# Patient Record
Sex: Female | Born: 1940 | Race: White | Hispanic: No | Marital: Married | Smoking: Never smoker
Health system: Southern US, Community
[De-identification: ages and names within clinical notes are randomized; demographics above are authoritative.]

## PROBLEM LIST (undated history)

## (undated) DIAGNOSIS — I10 Essential (primary) hypertension: Secondary | ICD-10-CM

## (undated) DIAGNOSIS — Q2112 Patent foramen ovale: Secondary | ICD-10-CM

## (undated) DIAGNOSIS — I340 Nonrheumatic mitral (valve) insufficiency: Secondary | ICD-10-CM

## (undated) DIAGNOSIS — Q211 Atrial septal defect: Secondary | ICD-10-CM

## (undated) DIAGNOSIS — I214 Non-ST elevation (NSTEMI) myocardial infarction: Secondary | ICD-10-CM

## (undated) DIAGNOSIS — E785 Hyperlipidemia, unspecified: Secondary | ICD-10-CM

## (undated) DIAGNOSIS — R569 Unspecified convulsions: Secondary | ICD-10-CM

## (undated) DIAGNOSIS — I2510 Atherosclerotic heart disease of native coronary artery without angina pectoris: Secondary | ICD-10-CM

## (undated) DIAGNOSIS — I255 Ischemic cardiomyopathy: Secondary | ICD-10-CM

## (undated) DIAGNOSIS — T8144XA Sepsis following a procedure, initial encounter: Secondary | ICD-10-CM

## (undated) DIAGNOSIS — Z8673 Personal history of transient ischemic attack (TIA), and cerebral infarction without residual deficits: Secondary | ICD-10-CM

## (undated) HISTORY — DX: Unspecified convulsions: R56.9

## (undated) HISTORY — DX: Nonrheumatic mitral (valve) insufficiency: I34.0

## (undated) HISTORY — DX: Hyperlipidemia, unspecified: E78.5

## (undated) HISTORY — DX: Sepsis following a procedure, initial encounter: T81.44XA

## (undated) HISTORY — DX: Non-ST elevation (NSTEMI) myocardial infarction: I21.4

## (undated) HISTORY — PX: OTHER SURGICAL HISTORY: SHX169

## (undated) HISTORY — DX: Atrial septal defect: Q21.1

## (undated) HISTORY — DX: Patent foramen ovale: Q21.12

## (undated) HISTORY — DX: Ischemic cardiomyopathy: I25.5

## (undated) HISTORY — DX: Essential (primary) hypertension: I10

## (undated) HISTORY — DX: Atherosclerotic heart disease of native coronary artery without angina pectoris: I25.10

## (undated) HISTORY — DX: Personal history of transient ischemic attack (TIA), and cerebral infarction without residual deficits: Z86.73

## (undated) HISTORY — PX: MITRAL VALVE REPAIR: SHX2039

---

## 2006-06-29 ENCOUNTER — Emergency Department (HOSPITAL_COMMUNITY): Admission: EM | Admit: 2006-06-29 | Discharge: 2006-06-29 | Payer: Self-pay | Admitting: Emergency Medicine

## 2007-09-18 DIAGNOSIS — I214 Non-ST elevation (NSTEMI) myocardial infarction: Secondary | ICD-10-CM

## 2007-09-18 HISTORY — DX: Non-ST elevation (NSTEMI) myocardial infarction: I21.4

## 2007-09-28 ENCOUNTER — Encounter: Admission: RE | Admit: 2007-09-28 | Discharge: 2007-09-28 | Payer: Self-pay | Admitting: Obstetrics and Gynecology

## 2007-10-03 ENCOUNTER — Ambulatory Visit: Admission: RE | Admit: 2007-10-03 | Discharge: 2007-10-03 | Payer: Self-pay | Admitting: Gynecology

## 2007-10-15 ENCOUNTER — Inpatient Hospital Stay (HOSPITAL_COMMUNITY): Admission: RE | Admit: 2007-10-15 | Discharge: 2007-11-13 | Payer: Self-pay | Admitting: Obstetrics and Gynecology

## 2007-10-15 ENCOUNTER — Encounter (INDEPENDENT_AMBULATORY_CARE_PROVIDER_SITE_OTHER): Payer: Self-pay | Admitting: Obstetrics and Gynecology

## 2007-10-15 ENCOUNTER — Ambulatory Visit: Payer: Self-pay | Admitting: Pulmonary Disease

## 2007-10-17 ENCOUNTER — Ambulatory Visit: Payer: Self-pay | Admitting: Pulmonary Disease

## 2007-10-17 ENCOUNTER — Ambulatory Visit: Payer: Self-pay | Admitting: Cardiology

## 2007-10-17 ENCOUNTER — Encounter (INDEPENDENT_AMBULATORY_CARE_PROVIDER_SITE_OTHER): Payer: Self-pay | Admitting: Cardiology

## 2007-10-18 ENCOUNTER — Ambulatory Visit: Payer: Self-pay | Admitting: Surgery

## 2007-10-18 ENCOUNTER — Encounter: Payer: Self-pay | Admitting: Pulmonary Disease

## 2007-10-21 ENCOUNTER — Encounter (INDEPENDENT_AMBULATORY_CARE_PROVIDER_SITE_OTHER): Payer: Self-pay | Admitting: Surgery

## 2007-11-07 ENCOUNTER — Ambulatory Visit: Payer: Self-pay | Admitting: Infectious Diseases

## 2007-11-09 ENCOUNTER — Encounter (INDEPENDENT_AMBULATORY_CARE_PROVIDER_SITE_OTHER): Payer: Self-pay | Admitting: Neurology

## 2007-11-11 ENCOUNTER — Encounter (INDEPENDENT_AMBULATORY_CARE_PROVIDER_SITE_OTHER): Payer: Self-pay | Admitting: Neurology

## 2007-11-14 ENCOUNTER — Inpatient Hospital Stay: Admission: RE | Admit: 2007-11-14 | Discharge: 2007-12-17 | Payer: Self-pay | Admitting: Internal Medicine

## 2007-11-14 ENCOUNTER — Ambulatory Visit: Payer: Self-pay | Admitting: Critical Care Medicine

## 2007-12-10 ENCOUNTER — Telehealth (INDEPENDENT_AMBULATORY_CARE_PROVIDER_SITE_OTHER): Payer: Self-pay | Admitting: *Deleted

## 2008-01-18 HISTORY — PX: CORONARY ARTERY BYPASS GRAFT: SHX141

## 2008-04-07 ENCOUNTER — Ambulatory Visit: Payer: Self-pay | Admitting: Vascular Surgery

## 2008-04-08 ENCOUNTER — Ambulatory Visit (HOSPITAL_COMMUNITY): Admission: RE | Admit: 2008-04-08 | Discharge: 2008-04-08 | Payer: Self-pay | Admitting: *Deleted

## 2008-04-08 ENCOUNTER — Encounter: Payer: Self-pay | Admitting: Surgery

## 2008-04-08 HISTORY — PX: CARDIAC CATHETERIZATION: SHX172

## 2008-04-14 ENCOUNTER — Ambulatory Visit: Payer: Self-pay | Admitting: Thoracic Surgery (Cardiothoracic Vascular Surgery)

## 2008-04-22 ENCOUNTER — Ambulatory Visit: Payer: Self-pay | Admitting: Thoracic Surgery (Cardiothoracic Vascular Surgery)

## 2008-04-22 ENCOUNTER — Inpatient Hospital Stay (HOSPITAL_COMMUNITY)
Admission: RE | Admit: 2008-04-22 | Discharge: 2008-04-27 | Payer: Self-pay | Admitting: Thoracic Surgery (Cardiothoracic Vascular Surgery)

## 2008-05-12 ENCOUNTER — Ambulatory Visit: Payer: Self-pay | Admitting: Thoracic Surgery (Cardiothoracic Vascular Surgery)

## 2008-05-12 ENCOUNTER — Encounter
Admission: RE | Admit: 2008-05-12 | Discharge: 2008-05-12 | Payer: Self-pay | Admitting: Thoracic Surgery (Cardiothoracic Vascular Surgery)

## 2008-09-15 ENCOUNTER — Inpatient Hospital Stay (HOSPITAL_COMMUNITY): Admission: RE | Admit: 2008-09-15 | Discharge: 2008-09-18 | Payer: Self-pay | Admitting: Surgery

## 2009-04-01 IMAGING — CR DG CHEST 1V PORT
1 series · 1 of 1 positions shown · non-contrast
Comparison: 11/11/2007

CLINICAL DATA: Myocardial infarction

PORTABLE CHEST - 1 VIEW

[AP]
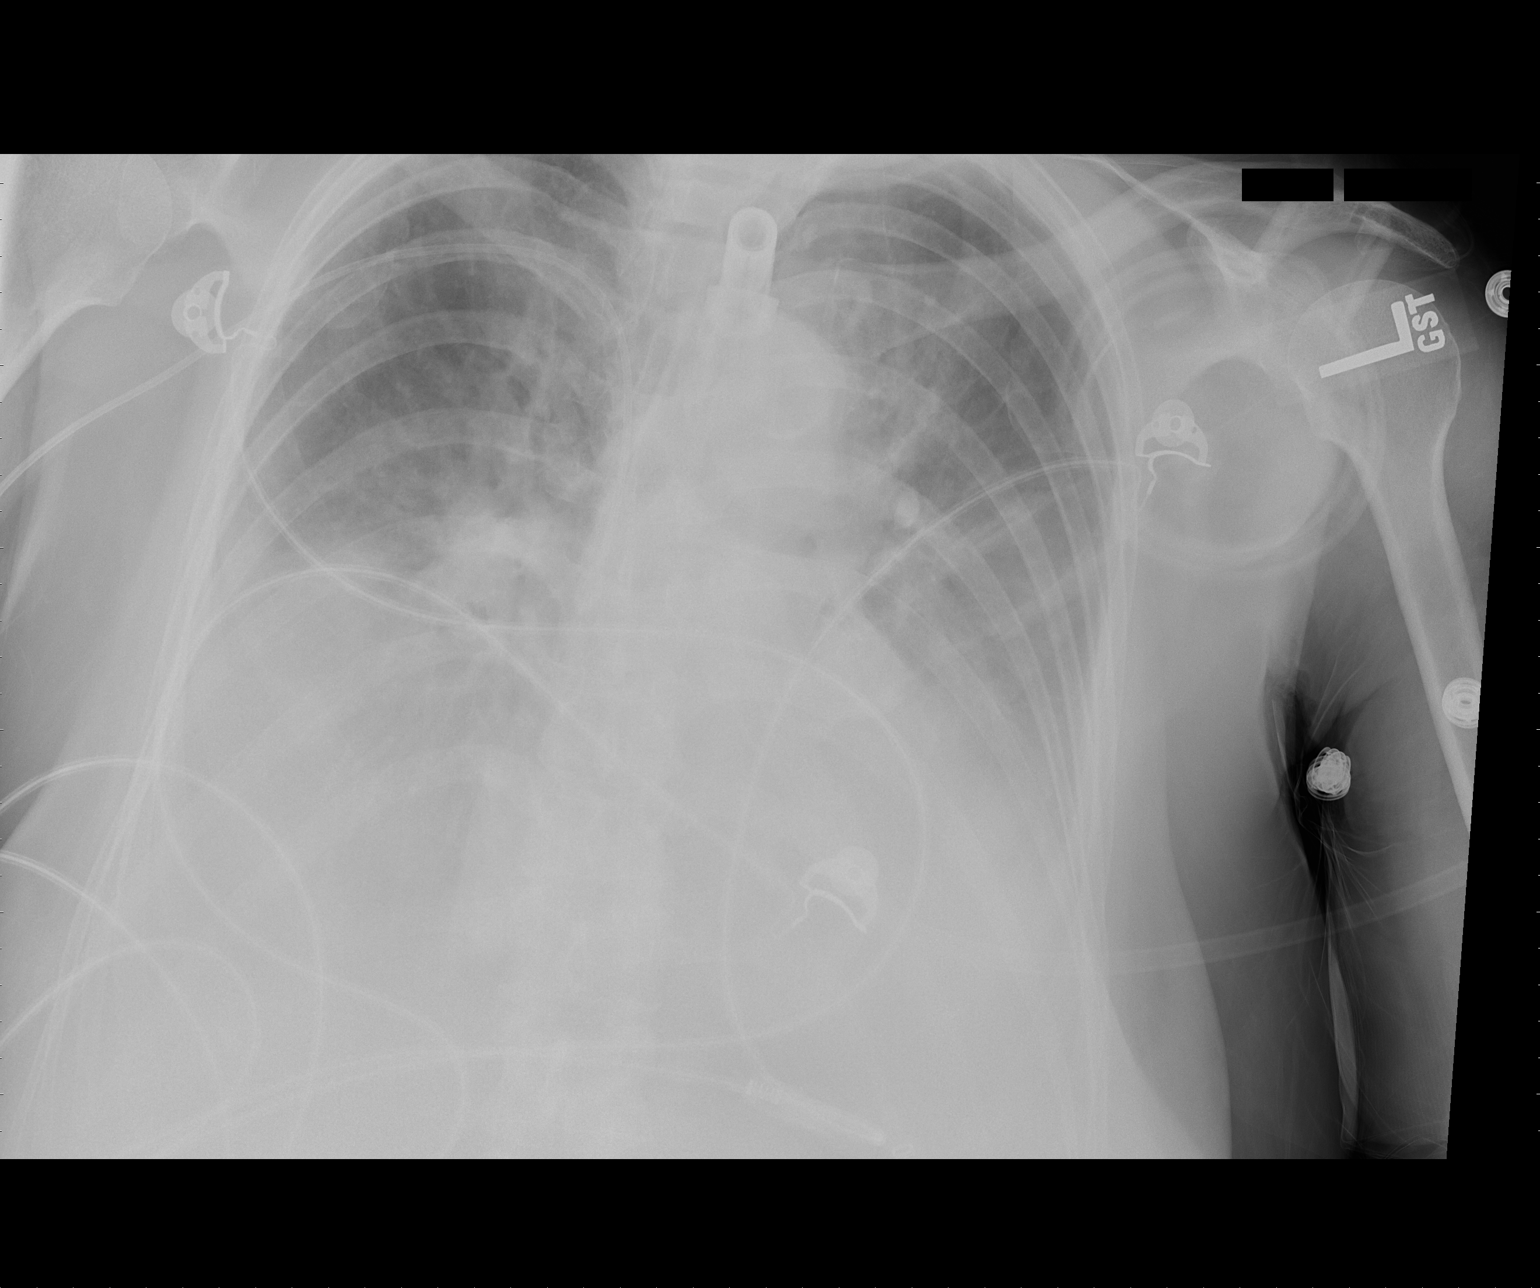

[1 of 1 positions shown; findings below may reference images not displayed]

FINDINGS: The tracheostomy tube is stable.  The right PICC line is
unchanged.  Persistent low lung volumes with vascular crowding,
atelectasis, edema and effusions.  No definite pneumothorax is
seen.
IMPRESSION: 1.  Stable support apparatus.
2.  No significant change in edema, atelectasis and effusions.

## 2009-04-02 IMAGING — CR DG CHEST 1V PORT
1 series · 1 of 1 positions shown · non-contrast
Comparison: 1 day prior

CLINICAL DATA: Shock.  Myocardial infarction.

PORTABLE CHEST - 1 VIEW

[AP]
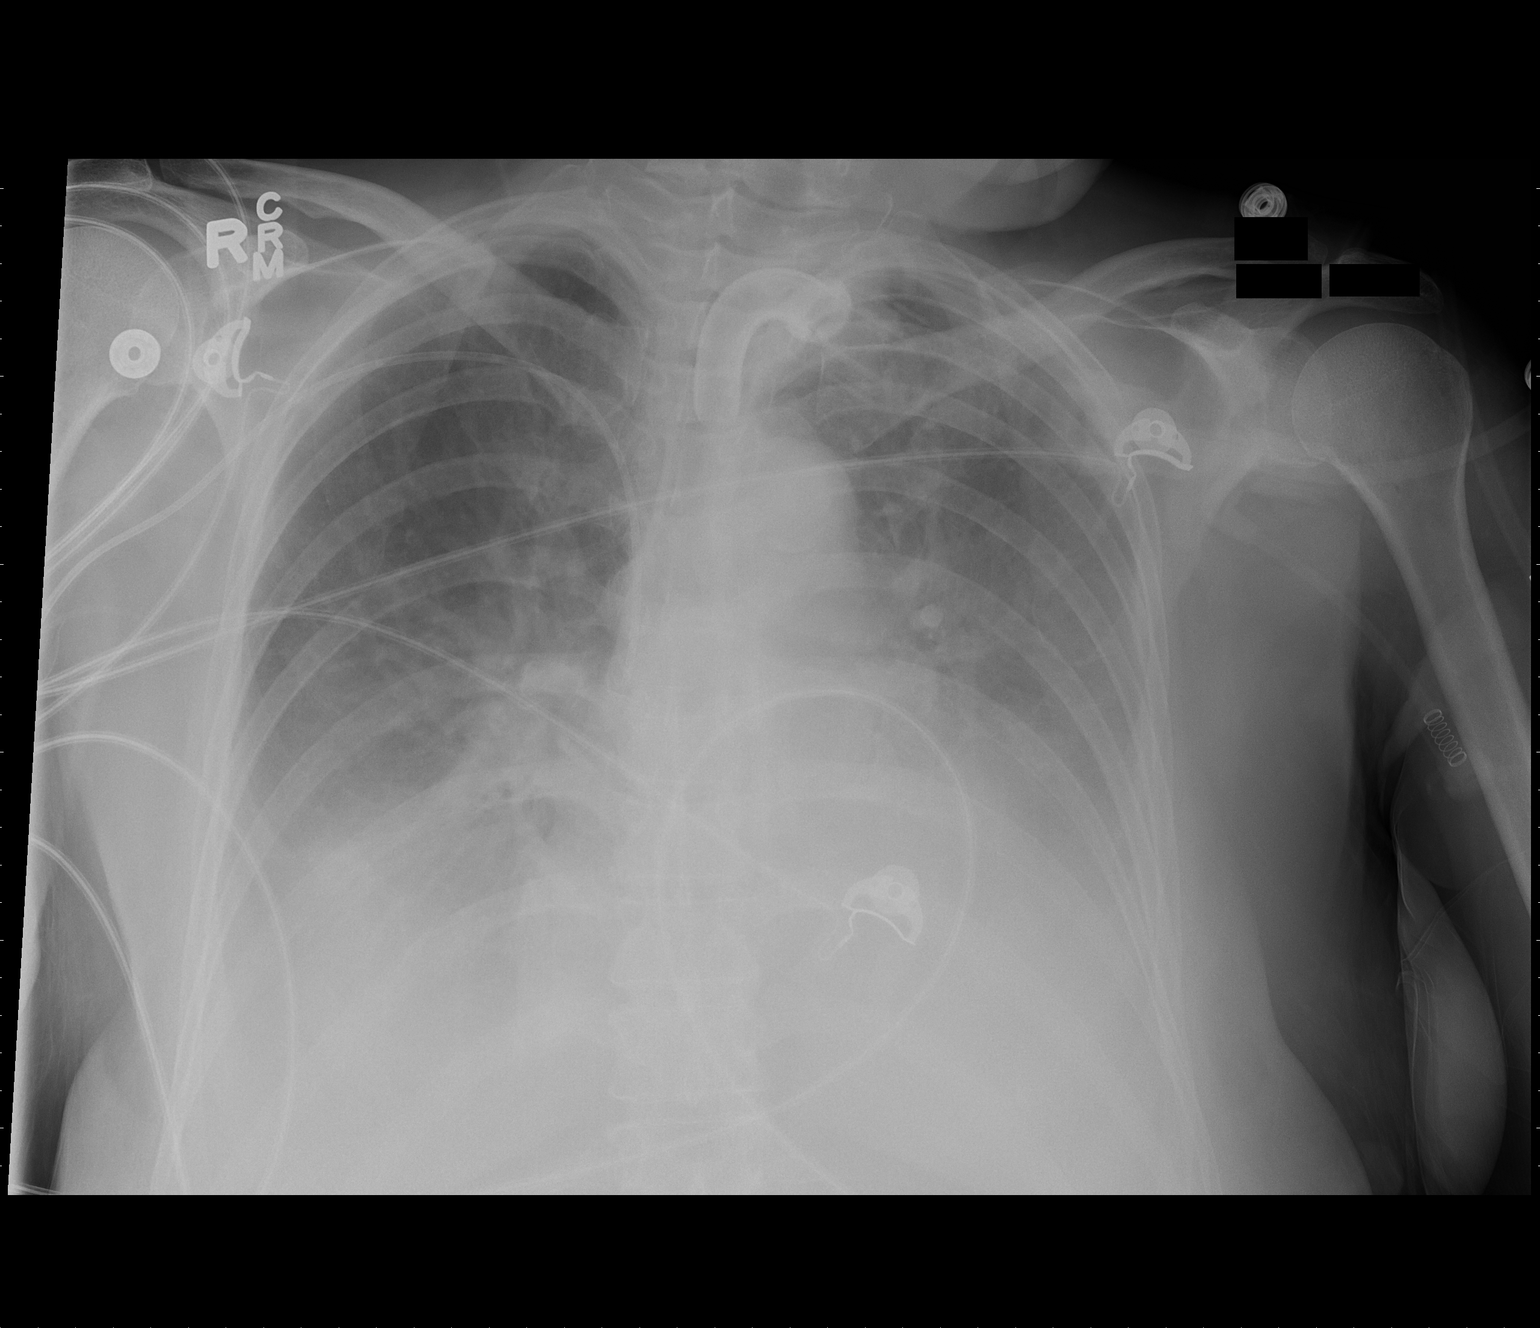

[1 of 1 positions shown; findings below may reference images not displayed]

FINDINGS: Right-sided PICC line and tracheostomy are both
unchanged. Midline trachea. Normal heart size for level of
inspiration.  Small bilateral pleural effusions are similar. No
pneumothorax.  Low lung volumes.  Improved bilateral interstitial
and airspace disease.  Airspace opacities in the lower lobes
greater left than right persist.
IMPRESSION: 1.  Overall improved aeration.
2.  Decreased pulmonary edema with persistent bibasilar atelectasis
and layering bilateral pleural effusions.

## 2009-04-04 IMAGING — CR DG CHEST 1V PORT
1 series · 1 of 1 positions shown · non-contrast
Comparison: 11/13/2007 study

CLINICAL DATA: History given of ventilator therapy, respiratory
failure

PORTABLE CHEST - 1 VIEW

[view not recorded]
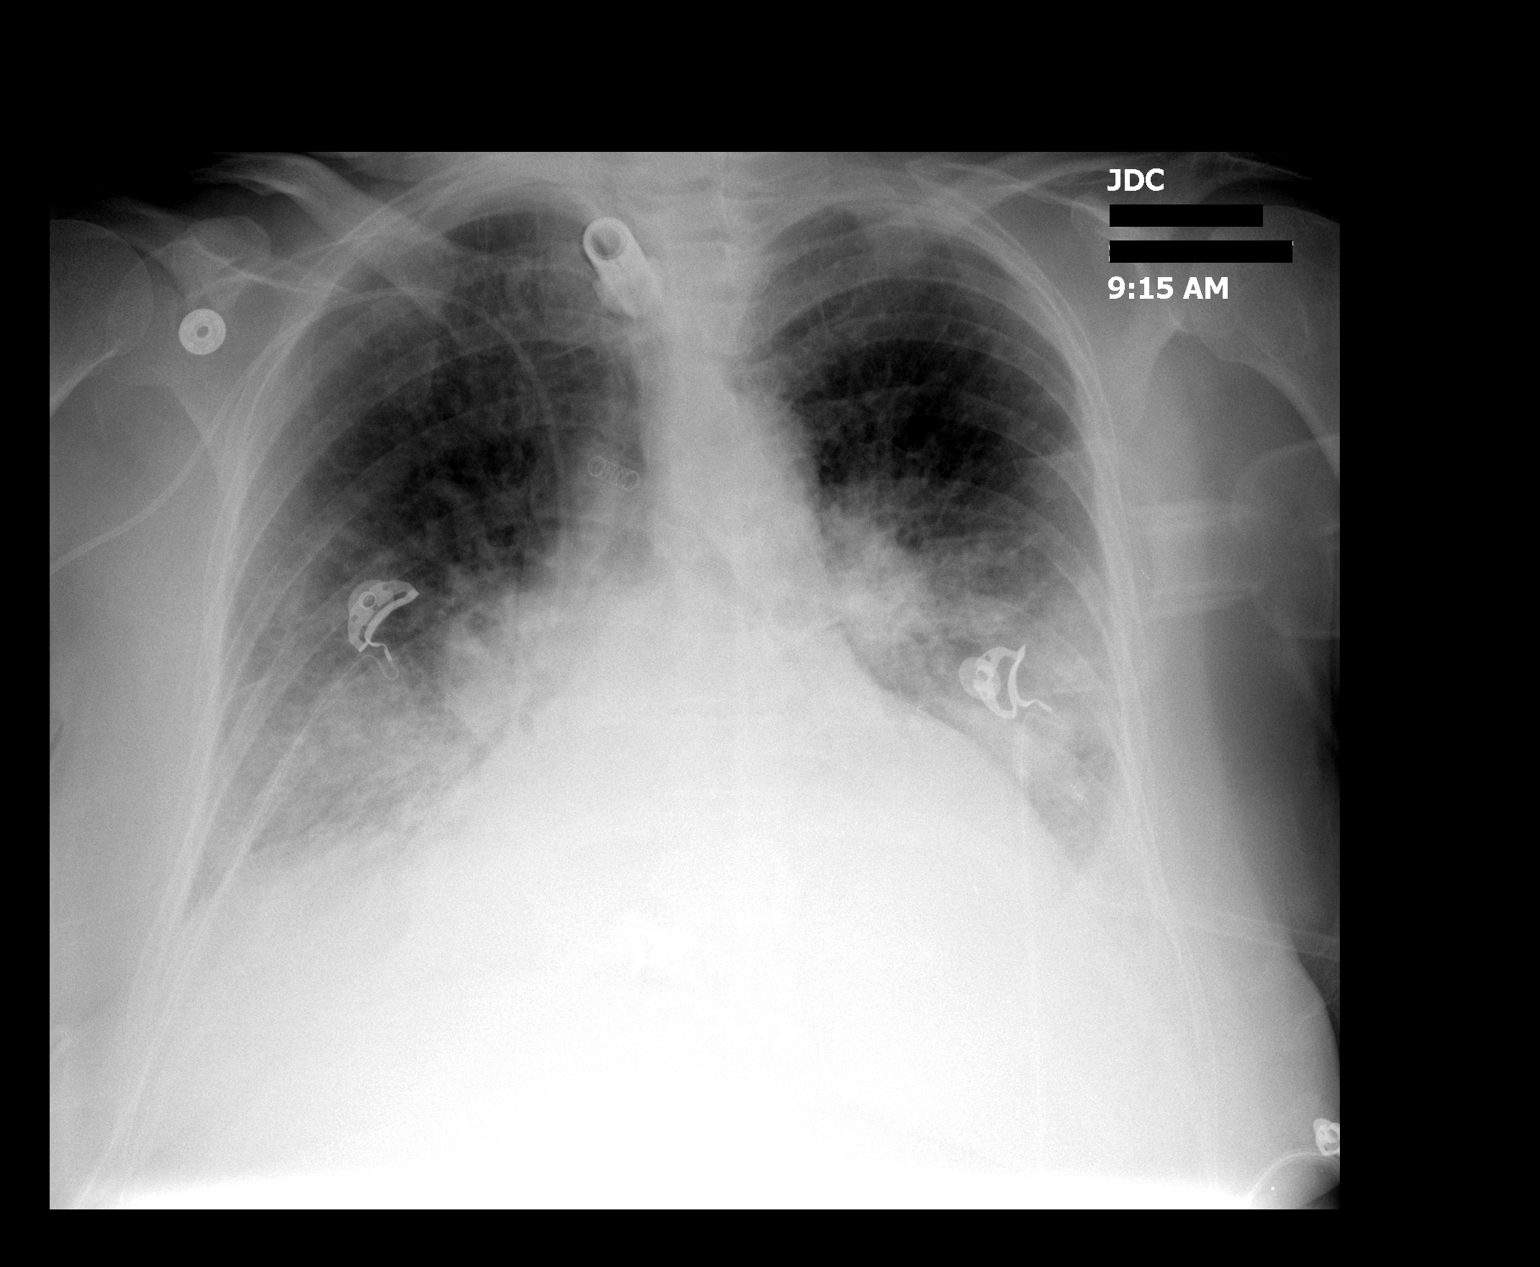

[1 of 1 positions shown; findings below may reference images not displayed]

FINDINGS: Tracheostomy tube is in place with tip 6 cm above the
carina.  PICC enters from the right side with tip in proximal
superior vena cava.  No pneumothorax is evident.

There is stable enlargement of the cardiac silhouette.  There is
bilateral hilar prominence.  Basilar atelectasis and pulmonary
infiltrative densities are seen bilaterally.  There appears to be a
small pleural effusion on the left.
IMPRESSION: Support apparatus is described above.  Continued enlargement of the
cardiac silhouette is seen.  Basilar atelectasis and infiltrative
densities are seen. There is slight increased aeration in the lung
bases compared to the previous study.

## 2009-04-12 IMAGING — CR DG CHEST 1V PORT
1 series · 1 of 1 positions shown · non-contrast
Comparison: 11/15/2007

CLINICAL DATA: Respiratory failure, pneumonia

PORTABLE CHEST - 1 VIEW

[AP]
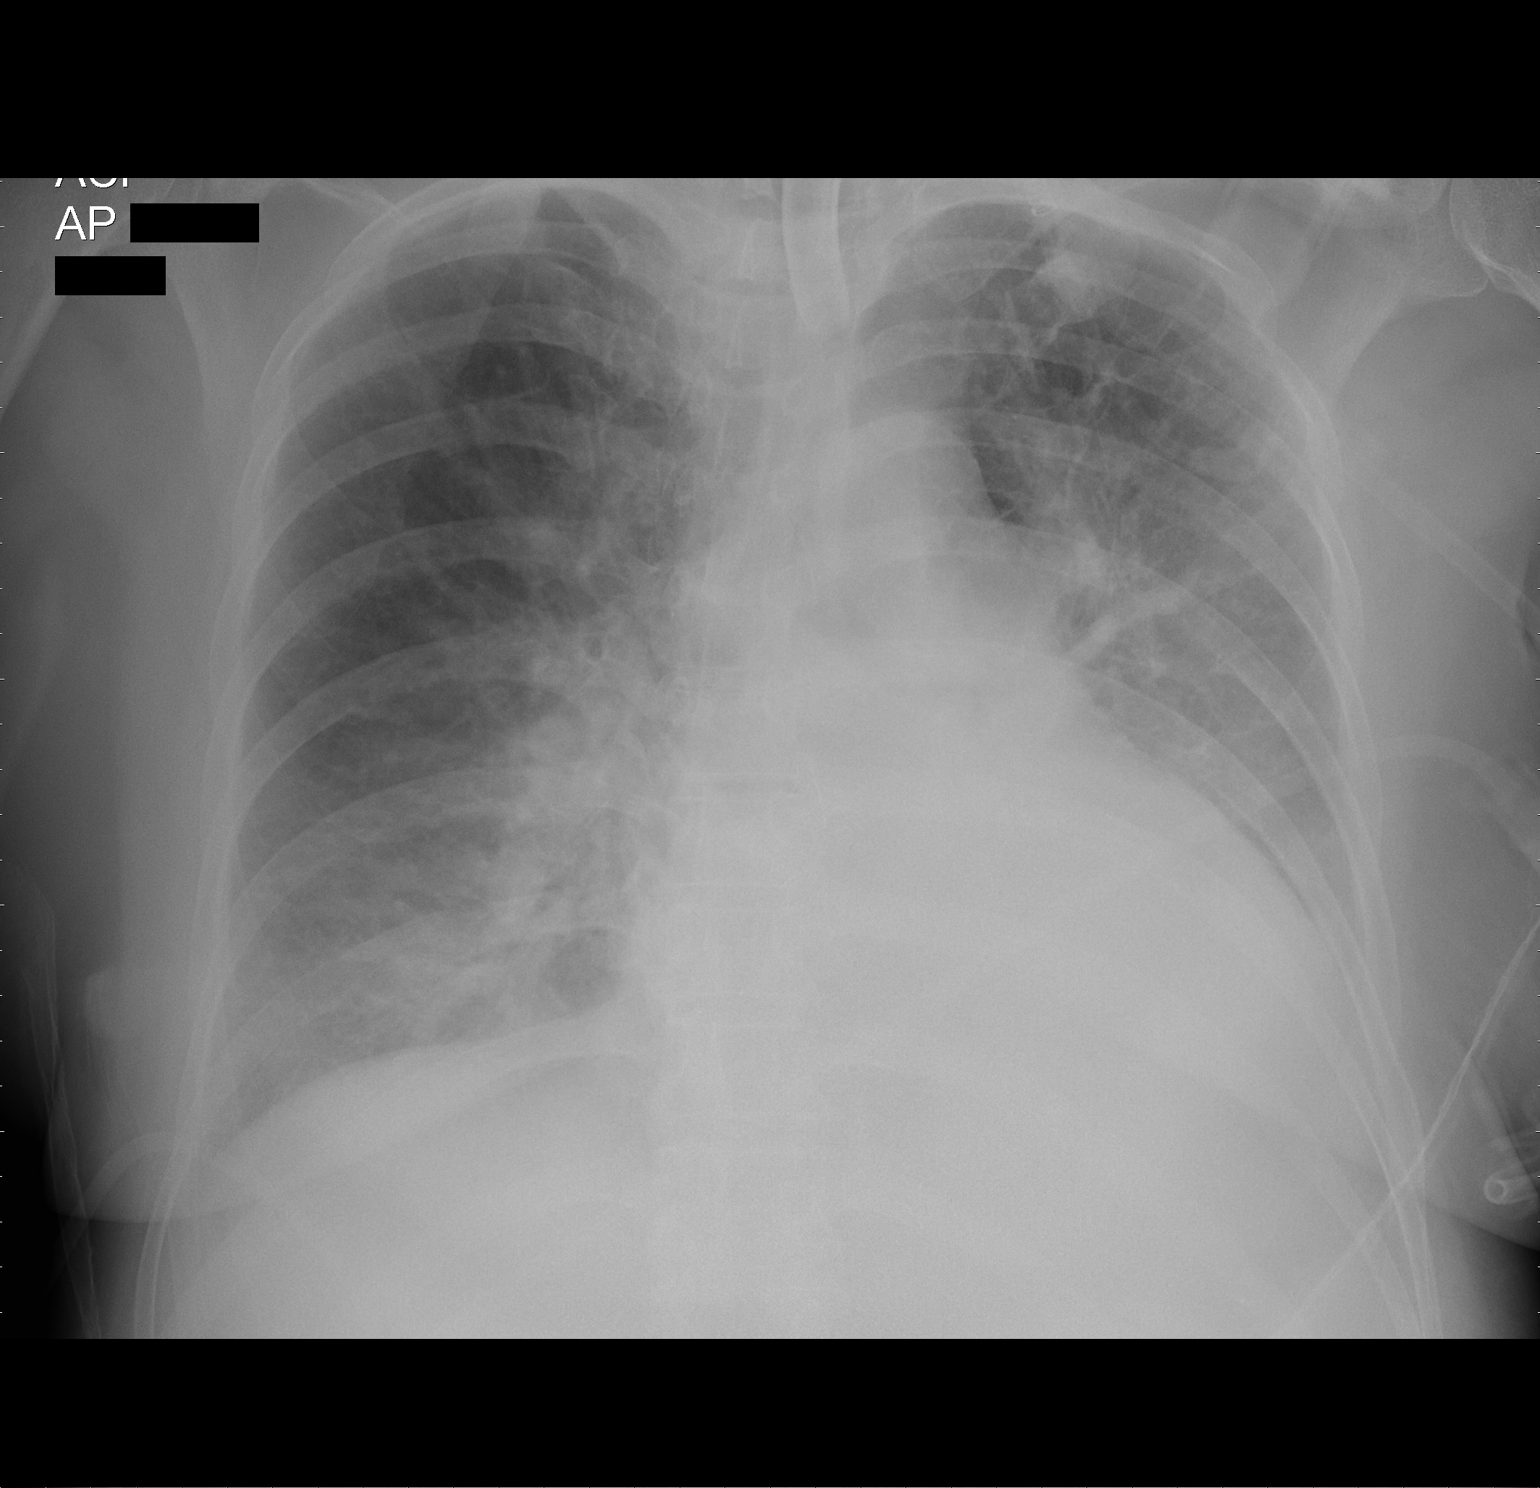

[1 of 1 positions shown; findings below may reference images not displayed]

FINDINGS: Tracheostomy tube remains projecting over the mid
trachea.  Right PICC line has been removed.  Heart is enlarged with
central vascular congestion and mild perihilar edema.  Left lower
lobe dense consolidation/atelectasis persist.  Basilar pneumonia is
not excluded.  No pneumothorax.
IMPRESSION: Cardiomegaly with mild perihilar edema.

Left lower lobe consolidation/atelectasis.

## 2009-04-13 IMAGING — CR DG ABD PORTABLE 2V
3 series · 3 of 3 positions shown · non-contrast
Comparison: CT scan [DATE]

CLINICAL DATA: Abdominal distention

ABDOMEN - 2 VIEW

[AP]
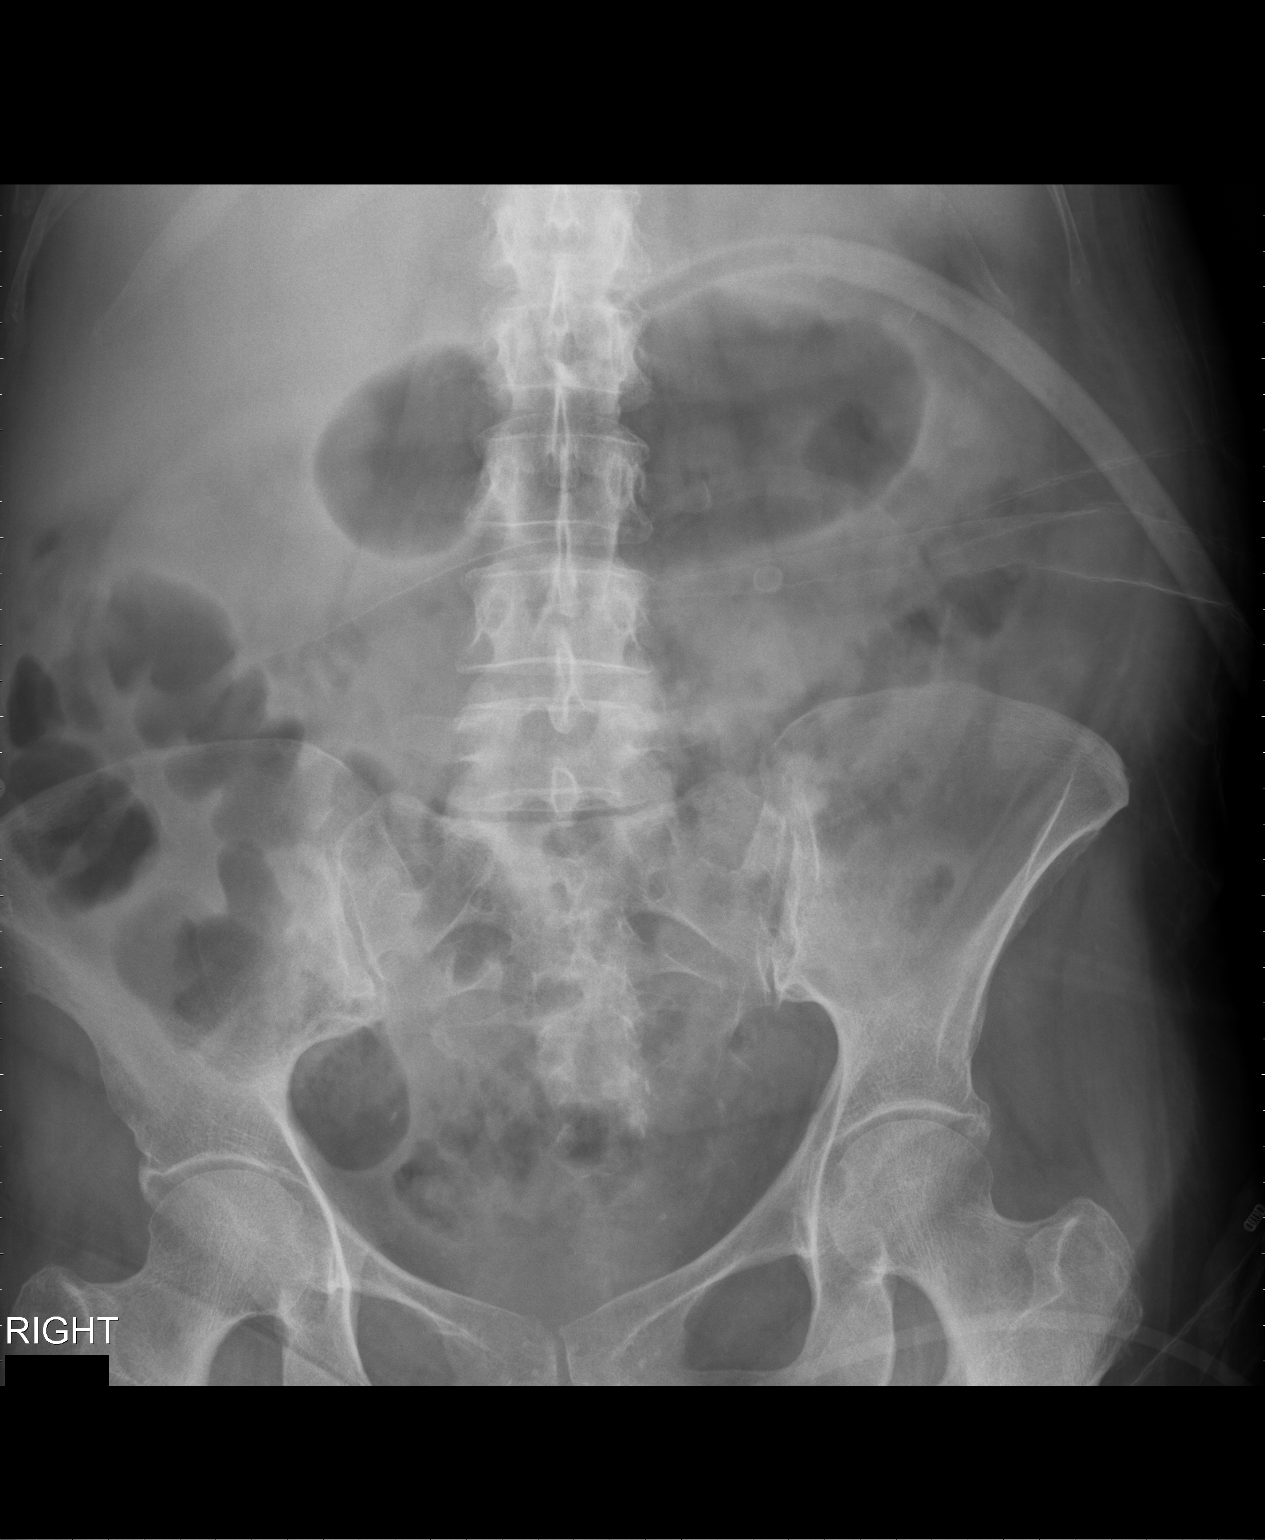

[lat decub abd (1 of 2)]
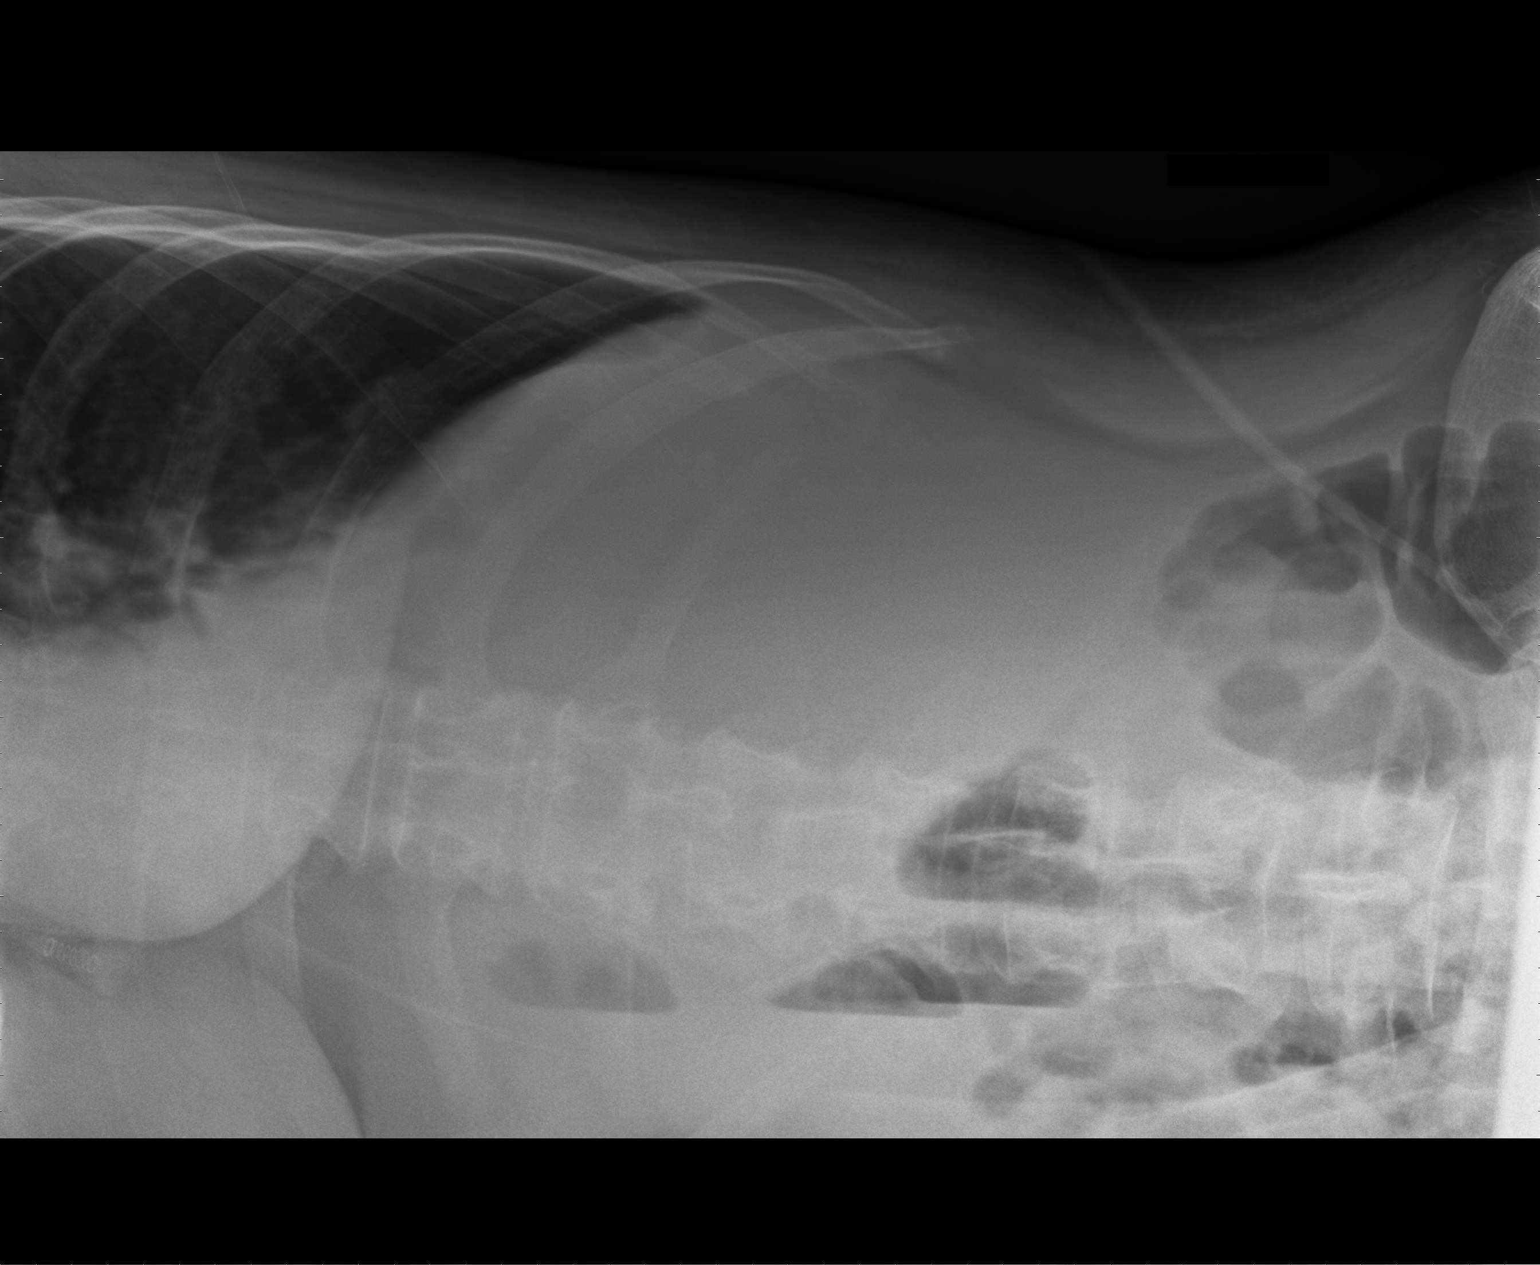

[lat decub abd (2 of 2)]
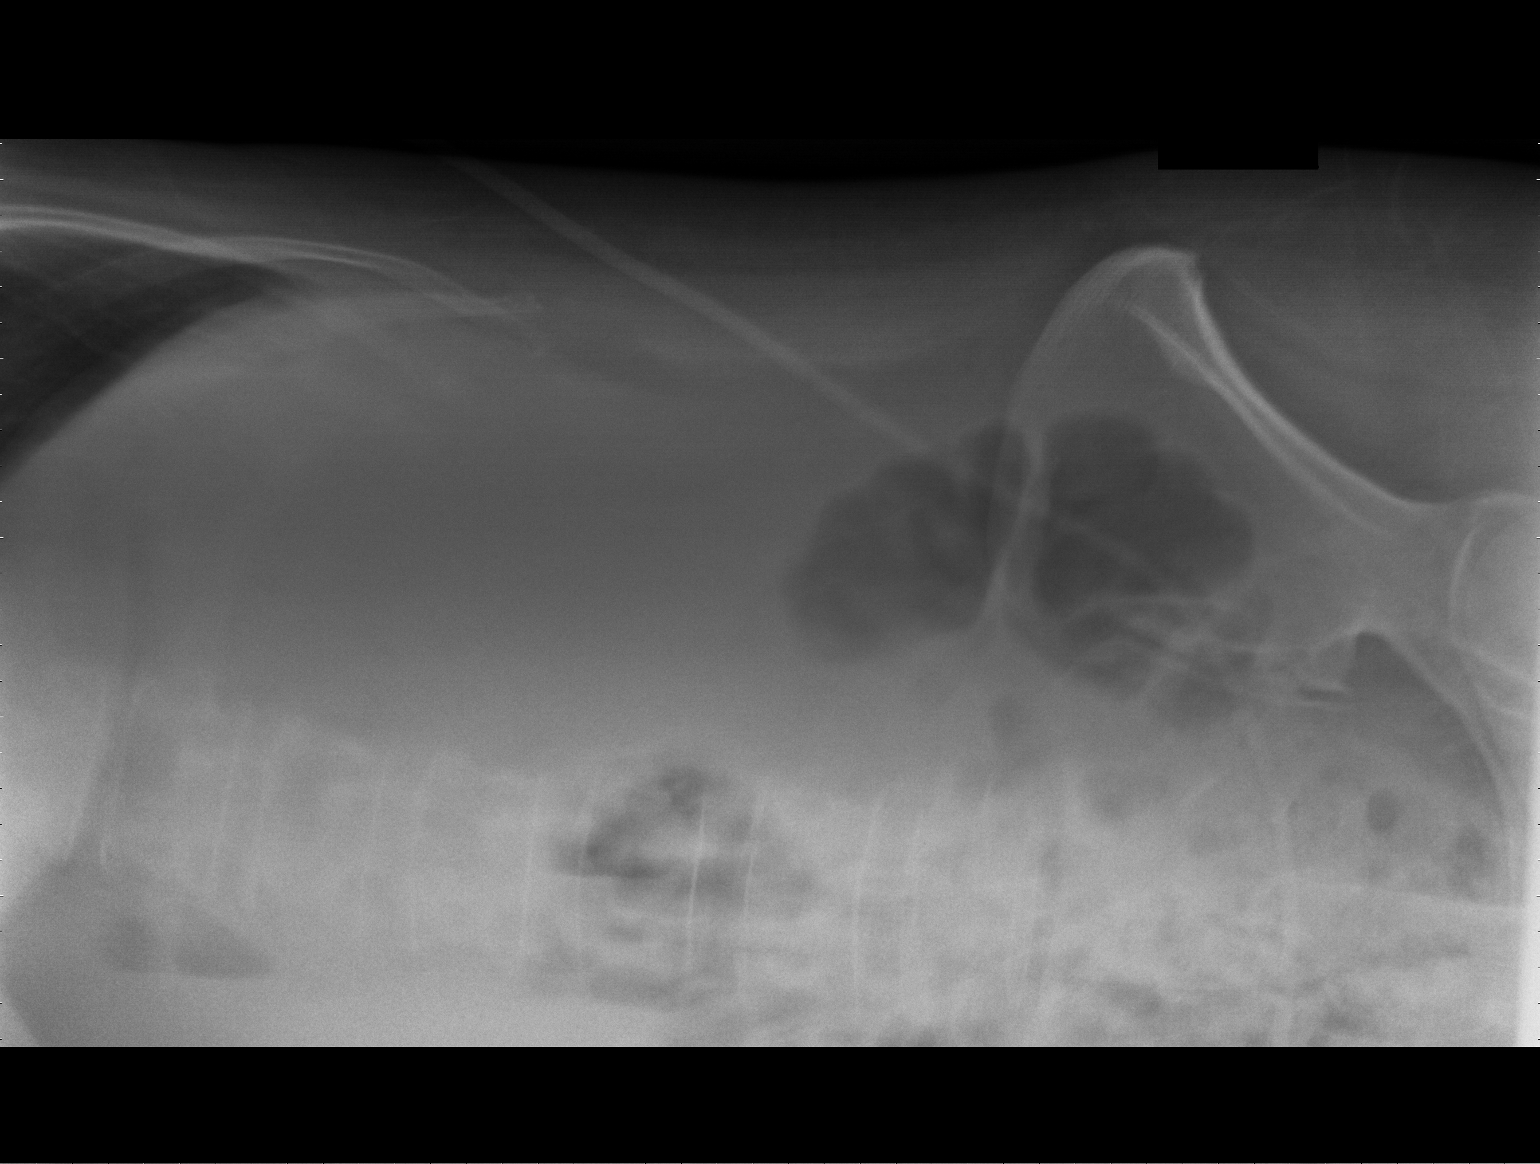

[3 of 3 positions shown; findings below may reference images not displayed]

FINDINGS: No free air.  There is gas throughout the intestine but
no sign of ileus or obstruction.  There is a peg tube.  There is an
ostomy on the left.  No acute bony finding.
IMPRESSION: Unremarkable bowel gas pattern.  No sign of ileus, obstruction or
free air.

## 2009-08-17 ENCOUNTER — Ambulatory Visit: Payer: Self-pay | Admitting: Cardiology

## 2010-02-07 ENCOUNTER — Encounter: Payer: Self-pay | Admitting: Internal Medicine

## 2010-02-22 ENCOUNTER — Ambulatory Visit (INDEPENDENT_AMBULATORY_CARE_PROVIDER_SITE_OTHER): Payer: Medicare Other | Admitting: Cardiology

## 2010-02-22 DIAGNOSIS — I2510 Atherosclerotic heart disease of native coronary artery without angina pectoris: Secondary | ICD-10-CM

## 2010-02-22 DIAGNOSIS — I43 Cardiomyopathy in diseases classified elsewhere: Secondary | ICD-10-CM

## 2010-02-22 DIAGNOSIS — E7800 Pure hypercholesterolemia, unspecified: Secondary | ICD-10-CM

## 2010-02-22 DIAGNOSIS — I10 Essential (primary) hypertension: Secondary | ICD-10-CM

## 2010-04-23 LAB — CBC
Platelets: 124 10*3/uL — ABNORMAL LOW (ref 150–400)
RDW: 13.6 % (ref 11.5–15.5)
WBC: 6.1 10*3/uL (ref 4.0–10.5)

## 2010-04-23 LAB — POTASSIUM: Potassium: 4 mEq/L (ref 3.5–5.1)

## 2010-04-23 LAB — CREATININE, SERUM
GFR calc Af Amer: >60 mL/min (ref >60)
GFR calc non Af Amer: >60 mL/min (ref >60)

## 2010-04-24 LAB — CREATININE, SERUM
Creatinine, Ser: 0.65 mg/dL (ref 0.4–1.2)
GFR calc Af Amer: >60 mL/min (ref >60)
GFR calc non Af Amer: >60 mL/min (ref >60)

## 2010-04-24 LAB — CBC
HCT: 42 % (ref 36.0–46.0)
Hemoglobin: 11.4 g/dL — ABNORMAL LOW (ref 12.0–15.0)
Hemoglobin: 14.4 g/dL (ref 12.0–15.0)
MCHC: 34.2 g/dL (ref 30.0–36.0)
MCHC: 34.3 g/dL (ref 30.0–36.0)
Platelets: 180 10*3/uL (ref 150–400)
RBC: 3.84 MIL/uL — ABNORMAL LOW (ref 3.87–5.11)
RDW: 13 % (ref 11.5–15.5)
WBC: 7.6 10*3/uL (ref 4.0–10.5)

## 2010-04-24 LAB — COMPREHENSIVE METABOLIC PANEL
Albumin: 4.3 g/dL (ref 3.5–5.2)
Alkaline Phosphatase: 256 U/L — ABNORMAL HIGH (ref 39–117)
BUN: 13 mg/dL (ref 6–23)
Calcium: 10.2 mg/dL (ref 8.4–10.5)
Glucose, Bld: 122 mg/dL — ABNORMAL HIGH (ref 70–99)
Potassium: 3.7 mEq/L (ref 3.5–5.1)
Sodium: 141 mEq/L (ref 135–145)
Total Protein: 7.8 g/dL (ref 6.0–8.3)

## 2010-04-24 LAB — DIFFERENTIAL
Lymphs Abs: 2 10*3/uL (ref 0.7–4.0)
Monocytes Absolute: 0.5 10*3/uL (ref 0.1–1.0)
Monocytes Relative: 6 % (ref 3–12)
Neutro Abs: 5.5 10*3/uL (ref 1.7–7.7)
Neutrophils Relative %: 68 % (ref 43–77)

## 2010-04-24 LAB — ALBUMIN: Albumin: 4.1 g/dL (ref 3.5–5.2)

## 2010-04-24 LAB — PROTIME-INR
INR: 0.9 (ref 0.00–1.49)
Prothrombin Time: 12.4 seconds (ref 11.6–15.2)

## 2010-04-24 LAB — POTASSIUM: Potassium: 3.3 mEq/L — ABNORMAL LOW (ref 3.5–5.1)

## 2010-04-28 LAB — BLOOD GAS, ARTERIAL
Acid-base deficit: 2.2 mmol/L — ABNORMAL HIGH (ref 0.0–2.0)
Drawn by: 206361
FIO2: 0.21 %
O2 Saturation: 97.7 %
Patient temperature: 98.6
pO2, Arterial: 86 mmHg (ref 80.0–100.0)

## 2010-04-28 LAB — CBC
HCT: 22.3 % — ABNORMAL LOW (ref 36.0–46.0)
HCT: 26 % — ABNORMAL LOW (ref 36.0–46.0)
HCT: 29.2 % — ABNORMAL LOW (ref 36.0–46.0)
HCT: 32.7 % — ABNORMAL LOW (ref 36.0–46.0)
Hemoglobin: 10.1 g/dL — ABNORMAL LOW (ref 12.0–15.0)
Hemoglobin: 11.6 g/dL — ABNORMAL LOW (ref 12.0–15.0)
Hemoglobin: 7.6 g/dL — CL (ref 12.0–15.0)
Hemoglobin: 8.1 g/dL — ABNORMAL LOW (ref 12.0–15.0)
MCHC: 31.2 g/dL (ref 30.0–36.0)
MCHC: 33.8 g/dL (ref 30.0–36.0)
MCHC: 34 g/dL (ref 30.0–36.0)
MCHC: 34 g/dL (ref 30.0–36.0)
MCHC: 34.5 g/dL (ref 30.0–36.0)
MCHC: 34.8 g/dL (ref 30.0–36.0)
MCV: 84.5 fL (ref 78.0–100.0)
MCV: 85.8 fL (ref 78.0–100.0)
MCV: 85.8 fL (ref 78.0–100.0)
MCV: 85.9 fL (ref 78.0–100.0)
MCV: 86.1 fL (ref 78.0–100.0)
MCV: 86.7 fL (ref 78.0–100.0)
MCV: 86.7 fL (ref 78.0–100.0)
Platelets: 127 10*3/uL — ABNORMAL LOW (ref 150–400)
Platelets: 136 10*3/uL — ABNORMAL LOW (ref 150–400)
Platelets: 141 10*3/uL — ABNORMAL LOW (ref 150–400)
Platelets: 89 10*3/uL — ABNORMAL LOW (ref 150–400)
Platelets: 95 10*3/uL — ABNORMAL LOW (ref 150–400)
RBC: 2.57 MIL/uL — ABNORMAL LOW (ref 3.87–5.11)
RBC: 3.03 MIL/uL — ABNORMAL LOW (ref 3.87–5.11)
RBC: 3.37 MIL/uL — ABNORMAL LOW (ref 3.87–5.11)
RBC: 3.87 MIL/uL (ref 3.87–5.11)
RBC: 3.96 MIL/uL (ref 3.87–5.11)
RBC: 5.1 MIL/uL (ref 3.87–5.11)
RDW: 13.1 % (ref 11.5–15.5)
RDW: 13.3 % (ref 11.5–15.5)
RDW: 13.4 % (ref 11.5–15.5)
RDW: 13.8 % (ref 11.5–15.5)
RDW: 13.9 % (ref 11.5–15.5)
RDW: 14 % (ref 11.5–15.5)
WBC: 13.6 10*3/uL — ABNORMAL HIGH (ref 4.0–10.5)
WBC: 13.8 10*3/uL — ABNORMAL HIGH (ref 4.0–10.5)
WBC: 8.2 10*3/uL (ref 4.0–10.5)
WBC: 8.7 10*3/uL (ref 4.0–10.5)

## 2010-04-28 LAB — GLUCOSE, CAPILLARY
Glucose-Capillary: 110 mg/dL — ABNORMAL HIGH (ref 70–99)
Glucose-Capillary: 112 mg/dL — ABNORMAL HIGH (ref 70–99)
Glucose-Capillary: 130 mg/dL — ABNORMAL HIGH (ref 70–99)
Glucose-Capillary: 131 mg/dL — ABNORMAL HIGH (ref 70–99)
Glucose-Capillary: 132 mg/dL — ABNORMAL HIGH (ref 70–99)
Glucose-Capillary: 134 mg/dL — ABNORMAL HIGH (ref 70–99)
Glucose-Capillary: 141 mg/dL — ABNORMAL HIGH (ref 70–99)
Glucose-Capillary: 144 mg/dL — ABNORMAL HIGH (ref 70–99)
Glucose-Capillary: 149 mg/dL — ABNORMAL HIGH (ref 70–99)
Glucose-Capillary: 151 mg/dL — ABNORMAL HIGH (ref 70–99)
Glucose-Capillary: 156 mg/dL — ABNORMAL HIGH (ref 70–99)
Glucose-Capillary: 157 mg/dL — ABNORMAL HIGH (ref 70–99)
Glucose-Capillary: 158 mg/dL — ABNORMAL HIGH (ref 70–99)
Glucose-Capillary: 160 mg/dL — ABNORMAL HIGH (ref 70–99)
Glucose-Capillary: 168 mg/dL — ABNORMAL HIGH (ref 70–99)
Glucose-Capillary: 175 mg/dL — ABNORMAL HIGH (ref 70–99)
Glucose-Capillary: 181 mg/dL — ABNORMAL HIGH (ref 70–99)

## 2010-04-28 LAB — BASIC METABOLIC PANEL
BUN: 11 mg/dL (ref 6–23)
BUN: 12 mg/dL (ref 6–23)
BUN: 15 mg/dL (ref 6–23)
BUN: 17 mg/dL (ref 6–23)
BUN: 17 mg/dL (ref 6–23)
CO2: 23 mEq/L (ref 19–32)
CO2: 24 mEq/L (ref 19–32)
CO2: 25 mEq/L (ref 19–32)
CO2: 26 mEq/L (ref 19–32)
CO2: 26 mEq/L (ref 19–32)
Calcium: 7.3 mg/dL — ABNORMAL LOW (ref 8.4–10.5)
Calcium: 8.1 mg/dL — ABNORMAL LOW (ref 8.4–10.5)
Calcium: 8.4 mg/dL (ref 8.4–10.5)
Calcium: 9 mg/dL (ref 8.4–10.5)
Chloride: 106 mEq/L (ref 96–112)
Chloride: 108 mEq/L (ref 96–112)
Chloride: 109 mEq/L (ref 96–112)
Chloride: 110 mEq/L (ref 96–112)
Chloride: 110 mEq/L (ref 96–112)
Creatinine, Ser: 0.55 mg/dL (ref 0.4–1.2)
Creatinine, Ser: 0.66 mg/dL (ref 0.4–1.2)
Creatinine, Ser: 0.7 mg/dL (ref 0.4–1.2)
Creatinine, Ser: 0.77 mg/dL (ref 0.4–1.2)
Creatinine, Ser: 0.82 mg/dL (ref 0.4–1.2)
GFR calc Af Amer: >60 mL/min (ref >60)
GFR calc Af Amer: >60 mL/min (ref >60)
GFR calc Af Amer: >60 mL/min (ref >60)
GFR calc Af Amer: >60 mL/min (ref >60)
GFR calc non Af Amer: >60 mL/min (ref >60)
GFR calc non Af Amer: >60 mL/min (ref >60)
GFR calc non Af Amer: >60 mL/min (ref >60)
GFR calc non Af Amer: >60 mL/min (ref >60)
Glucose, Bld: 111 mg/dL — ABNORMAL HIGH (ref 70–99)
Glucose, Bld: 116 mg/dL — ABNORMAL HIGH (ref 70–99)
Glucose, Bld: 125 mg/dL — ABNORMAL HIGH (ref 70–99)
Glucose, Bld: 141 mg/dL — ABNORMAL HIGH (ref 70–99)
Glucose, Bld: 164 mg/dL — ABNORMAL HIGH (ref 70–99)
Potassium: 3.3 mEq/L — ABNORMAL LOW (ref 3.5–5.1)
Potassium: 3.5 mEq/L (ref 3.5–5.1)
Potassium: 3.8 mEq/L (ref 3.5–5.1)
Potassium: 4.3 mEq/L (ref 3.5–5.1)
Sodium: 136 mEq/L (ref 135–145)
Sodium: 138 mEq/L (ref 135–145)
Sodium: 138 mEq/L (ref 135–145)
Sodium: 139 mEq/L (ref 135–145)

## 2010-04-28 LAB — POCT I-STAT 3, ART BLOOD GAS (G3+)
Acid-base deficit: 2 mmol/L (ref 0.0–2.0)
Acid-base deficit: 3 mmol/L — ABNORMAL HIGH (ref 0.0–2.0)
Bicarbonate: 21.7 mEq/L (ref 20.0–24.0)
Bicarbonate: 24.3 mEq/L — ABNORMAL HIGH (ref 20.0–24.0)
O2 Saturation: 92 %
O2 Saturation: 98 %
Patient temperature: 37.3
TCO2: 23 mmol/L (ref 0–100)
pCO2 arterial: 35.1 mmHg (ref 35.0–45.0)
pCO2 arterial: 35.6 mmHg (ref 35.0–45.0)
pCO2 arterial: 38.7 mmHg (ref 35.0–45.0)
pH, Arterial: 7.332 — ABNORMAL LOW (ref 7.350–7.400)
pH, Arterial: 7.359 (ref 7.350–7.400)
pH, Arterial: 7.38 (ref 7.350–7.400)
pO2, Arterial: 108 mmHg — ABNORMAL HIGH (ref 80.0–100.0)
pO2, Arterial: 240 mmHg — ABNORMAL HIGH (ref 80.0–100.0)
pO2, Arterial: 245 mmHg — ABNORMAL HIGH (ref 80.0–100.0)
pO2, Arterial: 283 mmHg — ABNORMAL HIGH (ref 80.0–100.0)
pO2, Arterial: 62 mmHg — ABNORMAL LOW (ref 80.0–100.0)

## 2010-04-28 LAB — POCT I-STAT 4, (NA,K, GLUC, HGB,HCT)
Glucose, Bld: 133 mg/dL — ABNORMAL HIGH (ref 70–99)
Glucose, Bld: 143 mg/dL — ABNORMAL HIGH (ref 70–99)
Glucose, Bld: 163 mg/dL — ABNORMAL HIGH (ref 70–99)
HCT: 22 % — ABNORMAL LOW (ref 36.0–46.0)
HCT: 23 % — ABNORMAL LOW (ref 36.0–46.0)
Hemoglobin: 12.6 g/dL (ref 12.0–15.0)
Hemoglobin: 7.8 g/dL — CL (ref 12.0–15.0)
Potassium: 3.3 mEq/L — ABNORMAL LOW (ref 3.5–5.1)
Potassium: 3.7 mEq/L (ref 3.5–5.1)
Potassium: 4 mEq/L (ref 3.5–5.1)
Potassium: 5.5 mEq/L — ABNORMAL HIGH (ref 3.5–5.1)
Sodium: 134 mEq/L — ABNORMAL LOW (ref 135–145)
Sodium: 141 mEq/L (ref 135–145)
Sodium: 141 mEq/L (ref 135–145)
Sodium: 143 mEq/L (ref 135–145)

## 2010-04-28 LAB — URINALYSIS, ROUTINE W REFLEX MICROSCOPIC
Bilirubin Urine: NEGATIVE
Protein, ur: NEGATIVE mg/dL
Urobilinogen, UA: 1 mg/dL (ref 0.0–1.0)

## 2010-04-28 LAB — CREATININE, SERUM
Creatinine, Ser: 0.52 mg/dL (ref 0.4–1.2)
Creatinine, Ser: 0.73 mg/dL (ref 0.4–1.2)
GFR calc Af Amer: >60 mL/min (ref >60)
GFR calc Af Amer: >60 mL/min (ref >60)
GFR calc non Af Amer: >60 mL/min (ref >60)
GFR calc non Af Amer: >60 mL/min (ref >60)

## 2010-04-28 LAB — POCT I-STAT, CHEM 8
BUN: 10 mg/dL (ref 6–23)
BUN: 15 mg/dL (ref 6–23)
Calcium, Ion: 1.08 mmol/L — ABNORMAL LOW (ref 1.12–1.32)
Calcium, Ion: 1.21 mmol/L (ref 1.12–1.32)
Chloride: 104 mEq/L (ref 96–112)
Chloride: 111 mEq/L (ref 96–112)
Creatinine, Ser: 0.5 mg/dL (ref 0.4–1.2)
Creatinine, Ser: 0.7 mg/dL (ref 0.4–1.2)
Glucose, Bld: 166 mg/dL — ABNORMAL HIGH (ref 70–99)
Glucose, Bld: 167 mg/dL — ABNORMAL HIGH (ref 70–99)
HCT: 24 % — ABNORMAL LOW (ref 36.0–46.0)
HCT: 32 % — ABNORMAL LOW (ref 36.0–46.0)
Hemoglobin: 10.9 g/dL — ABNORMAL LOW (ref 12.0–15.0)
Hemoglobin: 8.2 g/dL — ABNORMAL LOW (ref 12.0–15.0)
Potassium: 4.1 mEq/L (ref 3.5–5.1)
Potassium: 4.2 mEq/L (ref 3.5–5.1)
Sodium: 139 mEq/L (ref 135–145)
Sodium: 143 mEq/L (ref 135–145)
TCO2: 21 mmol/L (ref 0–100)
TCO2: 24 mmol/L (ref 0–100)

## 2010-04-28 LAB — TYPE AND SCREEN
ABO/RH(D): O POS
Antibody Screen: NEGATIVE

## 2010-04-28 LAB — POCT I-STAT GLUCOSE: Glucose, Bld: 149 mg/dL — ABNORMAL HIGH (ref 70–99)

## 2010-04-28 LAB — COMPREHENSIVE METABOLIC PANEL
AST: 33 U/L (ref 0–37)
CO2: 18 mEq/L — ABNORMAL LOW (ref 19–32)
Calcium: 10 mg/dL (ref 8.4–10.5)
Creatinine, Ser: 0.58 mg/dL (ref 0.4–1.2)
GFR calc Af Amer: >60 mL/min (ref >60)
GFR calc non Af Amer: >60 mL/min (ref >60)
Glucose, Bld: 141 mg/dL — ABNORMAL HIGH (ref 70–99)
Total Protein: 7.4 g/dL (ref 6.0–8.3)

## 2010-04-28 LAB — APTT
aPTT: 30 seconds (ref 24–37)
aPTT: 33 seconds (ref 24–37)

## 2010-04-28 LAB — PROTIME-INR
INR: 1 (ref 0.00–1.49)
INR: 1.4 (ref 0.00–1.49)
Prothrombin Time: 13.1 seconds (ref 11.6–15.2)
Prothrombin Time: 18 seconds — ABNORMAL HIGH (ref 11.6–15.2)

## 2010-04-28 LAB — MAGNESIUM
Magnesium: 2.4 mg/dL (ref 1.5–2.5)
Magnesium: 2.5 mg/dL (ref 1.5–2.5)
Magnesium: 2.8 mg/dL — ABNORMAL HIGH (ref 1.5–2.5)

## 2010-04-28 LAB — HEMOGLOBIN AND HEMATOCRIT, BLOOD
HCT: 22.6 % — ABNORMAL LOW (ref 36.0–46.0)
Hemoglobin: 7.8 g/dL — CL (ref 12.0–15.0)

## 2010-04-28 LAB — HEMOGLOBIN A1C: Hgb A1c MFr Bld: 6.3 % — ABNORMAL HIGH (ref 4.6–6.1)

## 2010-04-29 LAB — CBC
HCT: 42.7 % (ref 36.0–46.0)
Hemoglobin: 14.5 g/dL (ref 12.0–15.0)
MCHC: 33.9 g/dL (ref 30.0–36.0)
MCV: 85.7 fL (ref 78.0–100.0)
RDW: 13.6 % (ref 11.5–15.5)

## 2010-04-29 LAB — BASIC METABOLIC PANEL
CO2: 22 mEq/L (ref 19–32)
Chloride: 111 mEq/L (ref 96–112)
Glucose, Bld: 184 mg/dL — ABNORMAL HIGH (ref 70–99)
Potassium: 4.1 mEq/L (ref 3.5–5.1)
Sodium: 141 mEq/L (ref 135–145)

## 2010-05-21 ENCOUNTER — Other Ambulatory Visit: Payer: Self-pay | Admitting: Cardiology

## 2010-05-21 NOTE — Telephone Encounter (Signed)
escribe medication per fax request  

## 2010-06-01 NOTE — Op Note (Signed)
[name], [name]            ACCOUNT NO.:  [id number]   MEDICAL RECORD NO.:  [id number]          PATIENT TYPE:  INP   LOCATION:  0011                         FACILITY:  [facility]   PHYSICIAN:  Ardeth Sportsman, MD     DATE OF BIRTH:  1940-03-24   DATE OF PROCEDURE:  09/15/2008  DATE OF DISCHARGE:                               OPERATIVE REPORT   PRIMARY CARE PHYSICIAN:  Tammy R. Collins Scotland, M.D.   GYNECOLOGIST:  Hal Morales, M.D.   PULMONOLOGIST:  Kalman Shan, M.D.   SURGEON:  Karie Soda, M.D.   ASSISTANT:  [name]   PREOPERATIVE DIAGNOSES:  Status post sigmoid colectomy for perforated  diverticulitis and  Tubo-ovarian abscess, post bilateral salpingo-oophorectomy and abdominal  hysterectomy for retained intrauterine device.   POSTOPERATIVE DIAGNOSES:  Status post sigmoid colectomy for perforated  diverticulitis and  Tubo-ovarian abscess, post bilateral salpingo-oophorectomy and abdominal  hysterectomy for retained intrauterine device.   PROCEDURE PERFORMED:  1. Laparoscopic lysis of adhesions times 2-1/2 hours (equals two      thirds of the case).  2. Laparoscopically-assisted sigmoid colostomy takedown with a 29 EEA      stapled anastomosis.  3. Rigid proctoscopy.   ANESTHESIA:  1. She underwent general anesthesia.  2. Local anesthetic in a field block at all port sites and former      ostomy site.   SPECIMENS:  Ileostomy anastomotic rings (not same).   DRAINS:  None.   ESTIMATED BLOOD LOSS:  100 mL.   COMPLICATIONS:  None.   INDICATIONS:  [name] is a pleasant 70 year old female who  unfortunately presented with major pelvic abscess that required  abdominal hysterectomy and salpingo-oophorectomy by Dr. [name].  On postoperative day 10, she developed free air and had a perforated  sigmoid colon perforation and had resection.  Pathology was consistent  with diverticulitis.  She was in the hospital for several months but  ultimately  recovered and came home.  I saw her earlier this year for  consideration of ostomy takedown.  On cardiac evaluation had  a positive  catheterization, and she ended up getting a coronary bypass grafting  done by Dr. [name] on April 22, 2008.  She since recovered from  that, and Dr. [name], her cardiologist, feels that she is cleared.  She  wishes to have colostomy takedown.   The anatomy and physiology of the digestive tract was explained.  Pathophysiology of diverticulitis was discussed.  Options discussed and  recommendations made for a laparoscopic possible lysis of adhesions with  takedown of the colostomy.  The risks, benefits, and alternatives were  discussed.  Questions answered.  She agreed to proceed.   OPERATIVE FINDINGS:  She had dense intra-abdominal adhesions, especially  of greater omentum to the anterior abdominal wall and interloop  adhesions.  Most of them were rather soft, but they were much more dense  down into the pelvis.  Her former gastrostomy site was intact.   DESCRIPTION OF PROCEDURE:  Informed consent was confirmed.  The patient  received IV Invanz prior to surgery.  She also received IV ampicillin  and  gentamicin per anesthesia wishes given her history of a mitral valve  repair.  She underwent general anesthesia without any difficulty.  She  had a Foley catheter sterilely placed.  We had sequential pressure  devices activated throughout the entire case.  I did give her 5,000  units of heparin subcutaneously preoperatively.  She was on the oral  Alvimopan anti-ileus protocol.  She was positioned in low lithotomy with  the arms tucked.  A surgical time-out confirmed our plan.   I placed a #50 port in the right upper quadrant using optical-entry  technique with the patient in deep reverse Trendelenburg and right side  up.  Initially it was felt like I had just barely gotten to the  preperitoneal plane.  Camera inspection revealed that I was in the   preperitoneal space.  I placed a 5-mm port a little more laterally more  on the anterior axillary line along the subcostal region and got into  the perineal cavity more easily.  Camera inspection revealed that the  port just had not gone all the way through and therefore I placed it  through.  She had dense adhesions to the anterior abdominal wall.  These  were freed primarily using some cold scissors for greater omentum on the  right side of the abdomen.  I  created enough working space so that I  could place a 5-mm port in the right flank and right lower quadrant.  Later on in the case, I was able to place another port around the level  of the umbilicus.   It took meticulous care to free the greater omentum which was densely  adherent to the entire abdominal wall.  I was able to get to somewhat of  an avascular plane in that area, although occasionally I had some  bleeding with freeing this off.  I eventually used some ultrasonic  dissection on the more thicker branches after I was felt less  comfortable using cautery only, given the fact that there were small-  bowel loops and colon bowel loops within a few centimeters.  Gradually  over time I was able to free the greater omentum off the anterior  abdominal wall up to just above the umbilicus.  With that I could see  the left side of the abdomen and see the sigmoid colostomy.  There were  some small loops densely adherent to the descending colon mesentery.  I  gradually freed these off using cold scissors.  She had some dense  interloop adhesions and carefully I freed these off.  I had to sweep  most of the small bowel out of the lower abdomen and the pelvis.  Her  terminal ileum prior to the proximal the distal 3 feet of terminal ileum  was densely adherent down in the pelvis folded over upon itself, but  gradually over time and layer by layer I was able to free off one more  fold of ileum out until there was just the final fold.  The  greater  omentum also came down way down into the pelvis as well.  I was hard to  see with the greater omentum and small-bowel loops down in there, so I  amputated the greater omentum using ultrasonic dissection, leaving a few  inches cuff down in the pelvis because I was not able to find where the  rectum was at this point.  With that I could better see the small bowel  and through careful sharp dissection with cold  scissors only I was able  to free the last bits of small bowel out of the pelvis.  With that I  could come over the pelvic brim and free the small bowel off the left  paracolic gutter well and free that off.  I was able to remove the  entire small bowel over to the right side.  There was some small bowel  going up into the left upper quadrant underneath the descending colon  mesentery, but it was very proximal and I followed it, and it was  associated with the ligament of Treitz, but I was able to reduce it  back.   Camera inspection revealed no significant injury.  Hemostasis was  excellent.  I did copious irrigation and then inspected the small bowel  with no evidence of any significant serosal tears.   I turned attention to the descending colon and freed its attachments  from the lateral sidewall, and I followed it up into the anterior  abdominal wall opening.   I turned attention down to the rectal stump.  With that having been  freed out, I could find the greater omentum adherent to a blue Prolene  stitch.  I carefully skeletonized and freed off the necrotic greater  omentum off there and removed that.  Later that was removed at the time  of the ostomy takedown.  With careful inspection I could see the blue  stitch and see the end of the rectal stump with the prolene stitch on it  from the initial surgery.  Using some careful blunt dissection as well  as some cold scissors, I was able to define the posterior border well  and some of the lateral borders.  I saw an  area of kind white thickened  fold consistent with the vaginal cuff that seemed to be adhered to the  rectal stump.  I was able to carefully sweep some of that back using  primarily cold scissors.  At this point, Dr. Carolynne Edouard went down into the  perineal region.  He was able to get a 20 EEA sizer up in the rectum and  define the rectal stump well.  He was able to use hand in the vagina,  and we could see the distal and the vaginal wall.  With that, I was able  to further dissect and make sure that we had about 5 cm of freed rectal  stump circumferentially.  I avoided any more dissection down the pelvis  as I did not want to overskeletonize the stump.  After that I trimmed  off the excess tails of the Prolene stitch.   Next, we turned our attention to the colostomy takedown.  I did a  biconcave horizontal incision around the colostomy which I had already  sutured shut prior to the case.  I used cautery and blunt dissection to  free its attachments in the anterior abdominal wall and entered into the  pocket just at the level of the fascia and numerous levels and was able  to free it off very easily.  There was one area of a questionable  serosal tear and in that we put a 3-0 silk stitch to close that down.  With that I had excellent length.  I placed a soft bowel clamp and  transected the distal 2 cm of the colostomy.  The mucosa had healthy  bleeding tissue.  We used sizers, and a 29 fit snugly.  Therefore, we  used a 29 anvil and placed the  anvil into the sigmoid colostomy.  I used  a 0 Prolene running pursestring stitch to help tighten it down.  There  was a little laxity, so I did a 2-0 silk stitch to help tighten the hole  more.  There was a prominent epiploic appendage that we carefully  skeletonized and freed off.  Inspection was made, there were no  diverticula around this region.  We evacuated that necrotic omentum  forward and allowed the anvil to fall back into the peritoneal  cavity.  I clamped the skin with some towel clamps.  The capnoperitoneum was  reintroduced.  Copious irrigation was done, and hemostasis was  excellent.  After some gentle dilation and confirming anorectal  placement, Dr. Carolynne Edouard placed the EEA stapler up the rectum.  We did an EEA  stapled anastomosis by placing the anvil of the descending colon down.  It came down very easily.  I went back for inspection, and the mesentery  laid well, with no twisting or turning.  The anvil was attached to the  stapler.  The stapler was brought down and not tightened.  It was held  for 30 seconds, fired, and held clamped for 30 seconds while fired and  released.  We had two excellent anastomotic rings.   Dr. Carolynne Edouard performed rigid proctoscopy and confirmed no evidence of any  bleeding.  Anastomosis was at 13 cm from the anal verge.  He did  insufflation under water with me clamping just proximal to the  anastomosis and there was a negative air-bubble leak test.  Anastomosis  looked well and healthy.  I did well more copious irrigation with clear  return.  I did inspection of the rest of the abdomen, and hemostasis was  excellent.  I allowed the greater omentum to fall back down into the  pelvis as well as some of the small bowel came down with it as well.  I  evacuated capnoperitoneum through the ports.  The final ports were  removed.  I closed the ostomy wound, the ostomy abdominal defect, using  0 Vicryl vertical closure of the posterior rectus fascia and #1 looped  PDS transversely to reapproximate the anterior rectus fascia.  The wound  was irrigated copiously with saline and closed loosely with some  interrupted 4-0 Monocryl stitches.  I placed triple-antibiotic ointment  and a few Telfa wicks into the wound to help allow it to drain.  The  rest of skin was closed using 4-0 stitch.  A sterile dressing was  applied.   The patient was extubated and sent to the recovery room in stable  condition.  I  discussed postoperative care with the patient in detail  just prior to surgery and discussed it with the family as well.      Ardeth Sportsman, MD  Electronically Signed     SCG/MEDQ  D:  09/15/2008  T:  09/15/2008  Job:  [id number]   cc:   Tammy R. Collins Scotland, M.D.  Fax: [phone]   Hal Morales, M.D.  Fax: [phone]   Kalman Shan, MD  [address] [place] [address]  [place] [address]   Dr. Ailene Ards. Merlyn Albert C. [name], M.D.  [address]  [place]  [place] [address]

## 2010-06-01 NOTE — Consult Note (Signed)
NAMECARLINA, [name]            ACCOUNT NO.:  [id number]   MEDICAL RECORD NO.:  [id number]          PATIENT TYPE:  INP   LOCATION:  9374                          FACILITY:  [facility]   PHYSICIAN:  [name], MD          DATE OF BIRTH:  27-Jan-1940   DATE OF CONSULTATION:  10/16/2007  DATE OF DISCHARGE:  10/17/2007                                 CONSULTATION   REQUESTING PHYSICIAN:  Erie Noe P. Pennie Rushing, M.D.   REASON FOR CONSULTATION:  This patient is being seen in consultation at  the request of Dr. Erie Noe P. Haygood for evaluation of tachycardia and  abnormal ECG.   HISTORY OF PRESENT ILLNESS:  The patient is a 70 year old white female  with history of hypertension who comes in for an inflammatory pelvic  mass that she underwent surgical exploration for on October 15, 2007.  She is now postop day 2.  She did well in the postoperative course;  however, today approximately at 10:30 a.m., she developed acute sinus  tachycardia with some hypoxia and worsening hypotension.  She maintained  this throughout the day, with recent decompensation.  In evaluation for  causes for her hypotension, she had cardiac biomarkers, which were found  to be elevated, and repeat ECG demonstrated global ST depression, and in  regards to her past cardiac history she has had none.  No history of MI  per the chart in the past, only history of hypertension.  She denied any  chest pain or discomfort prior to this procedure, and upon questioning  she had mild substernal chest pressure with significant orthopnea and  mild dyspnea on exertion.  The patient reports these symptoms have been  progressively worse throughout the day.   PAST MEDICAL HISTORY:  1. Hypertension in March 2009.  2. Gastroesophageal reflux disease.  3. 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   SOCIAL HISTORY:  Married, nonsmoker, nondrinker, non IV drug user.   MEDICATIONS AT HOME:   K-Dur, atenolol/hydrochlorothiazide, and  multivitamin.   ALLERGIES:  No known drug allergies.   FAMILY HISTORY:  Reviewed and noncontributory to the patient's current  medical condition.   REVIEW OF SYSTEMS:  Negative 11-point review of systems except for those  dictated in the above HPI.   PHYSICAL EXAMINATION:  VITAL SIGNS:  On my evaluation, blood pressure  106/74, heart rate 130, T-max is 100.5.  GENERAL:  A well developed, well nourished, white female in moderate  respiratory distress.  HEENT:  Moist mucous membranes.  No scleral icterus or conjunctival  pallor.  NECK:  Supple.  Full range of motion.  Jugular venous pressure estimated  at 15-20 cm of water.  CARDIOVASCULAR:  Sinus tachycardia but no murmurs, rubs, or gallops.  CHEST:  Decreased breath sounds at the bases, right greater than left.  ABDOMEN:  Soft with mild tenderness to the right upper quadrant but no  rebound.  No tenderness.  EXTREMITIES:  Mild pretibial edema, approximately trace to 1+.  NEUROLOGIC:  Alert and oriented x3.  No gross  neuro deficits.   DIAGNOSTIC DATA:  EKG demonstrates sinus tachycardia with diffuse ST  depressions and mild elevation in aVR concerning for diffuse coronary  disease versus proximal LAD or left main lesion.  Chest x-ray  demonstrates right pleural effusion that is new since prior x-ray.  Her  CT scan demonstrates diffuse calcification of the mid and proximal LAD  as well as circumflex artery.  Her laboratory data is significant for  troponin I of 17.  MB has not been performed yet.  BUN and creatinine  within normal limits, and her hemoglobin is 10.1.  Bedside  echocardiogram performed by me demonstrates grossly normal LV function  with a dilated RV; however, images were difficult to obtain on obstetric  ultrasound machine.   IMPRESSION:  1. Hypotension, with evidence of subendocardial global ischemia and      positive biomarkers in the setting of hypoxia and likely  severe 3V      coronary artery disease  2. Sinus tachycardia   Differential includes pulmonary embolism versus vasodilatory shock with  subsequent fixed obstructive coronary disease versus acute coronary  syndrome.  At least at this point, I think further evaluating her  possible etiology for pulmonary embolism is probably the most crucial to  obtain right now.  We will start on aspirin and heparin drip for right  now.  We would not urgently bring her to the cath lab at this point.  We  will get a more complete ultrasound once we transfer her to [facility]  At least at this point, the patient is being intubated for the  respiratory failure and would be cautious at least right now with fluids  until chest x-ray and a more formal ultrasound could be obtained.  We  will continue to follow with you throughout the evening.   Thank you very much for this consult.      [name], MD  Electronically Signed     JT/MEDQ  D:  10/16/2007  T:  10/17/2007  Job:  [id number]

## 2010-06-01 NOTE — Op Note (Signed)
NAMEDIAVIAN, FURGASON NO.:  [id number]   MEDICAL RECORD NO.:  [id number]          PATIENT TYPE:  INP   LOCATION:  2913                         FACILITY:  [facility]   PHYSICIAN:  Nelda Bucks, MD DATE OF BIRTH:  Apr 27, 1940   DATE OF PROCEDURE:  DATE OF DISCHARGE:                               OPERATIVE REPORT   PROCEDURE:  Percutaneous tracheostomy.   BRONCHOSCOPIST:  Oretha Milch, MD   ASSISTANT:  Devra Dopp, MSN, ACNP   DESCRIPTION OF THE PROCEDURE:  Consent was obtained from the husband,  fully aware of risks and benefits of the procedure.  Blood loss during  the procedure, less than 1 mL.  The patient's operative site was the  second and third intratracheal space, this was prepped with  Chlorhexidine preparation.  1% lidocaine with epinephrine, 6 mL was  injected into the operative site.  The ET tube was backed down upon  direct bronchoscopic guidance to approximately 18 cm without loss of  airway.  A 1-cm vertical incision was made down dissecting down the  tracheal planes with no significant bleeding.  Hemostasis was obtained.  Dissection ended.  An 18-gauge needle over right catheter sheath was  placed into the airway in the third intratracheal space, directly  visualized bronchoscopically.  There was no posterior wall injury noted.  The right catheter sheath remained.  The wire and the needle were  removed.  The wire was placed through the right catheter.  Again, no  posterior wall injury was noted, no lacerations were noted.  The right  catheter sheath was removed.  A 14-French punch dilator was placed over  the wire into the airway successfully and noted by the bronchoscopy.  The 14-French punch dilatator was removed.  A Rhino progressive glider  was placed over the wire to 37-French and dilation took place.  Dilator  removed, glider and wire remained.  A size 6 tracheostomy over 26-French  dilator was placed over the wire and glider into the  airway and  successfully noted.  No additional bleeding was noted.  The glider and  wire and 26-French dilator was removed, and the tracheostomy remained.  An 8 mL of air was insufflated into the air cuff.  The bronchoscope was  then placed with the ventilator through the new airway and noted the  carina approximately 5 cm below with no active bleeding or oozing, or  posterior wall injury.  The patient had received etomidate, fentanyl  bolus, and Versed with some mild postoperative hypertension and mean  arterial pressure of 59 requiring boluses  at this moment.  Portable  chest x-ray is pending.  The patient has equal breath sounds at this  time.  Saturation of 100%.      Nelda Bucks, MD  Electronically Signed     DJF/MEDQ  D:  11/01/2007  T:  11/02/2007  Job:  [id number]

## 2010-06-01 NOTE — H&P (Signed)
HISTORY AND PHYSICAL EXAMINATION   April 14, 2008   Re:  [name], [name]            DOB:  Sep 24, 1940   REASON FOR CONSULTATION:  Three-vessel coronary artery disease.  Consider bypass grafting.   HISTORY OF PRESENT ILLNESS:  The patient is a 70 year old woman, who was  found to have three-vessel coronary artery disease recently on cardiac  catheterization.  She was hospitalized in September 2009 for pelvic  infections secondary to an IUD.  She underwent a hysterectomy and  subsequently developed peritonitis, required a sigmoid colectomy with an  end colostomy.  During her hospitalization, she developed sepsis.  She  had a stroke and also had a non-Q-wave myocardial infarction.  She was  found to have an ejection fraction of 38%.  She has been asymptomatic  from a coronary standpoint, but now is being considered for a colostomy  takedown and reconstitution of her gastrointestinal tract.  It was felt  she need a Cardiology clearance prior to that and she underwent cardiac  catheterization after a stress test showed a moderate sized reversible  inferolateral defect.  Ejection fraction was 38%.  At catheterization,  she was found to have 50% mid LAD stenosis and a 70-80% distal LAD  stenosis.  The ramus intermedius is subtotally occluded proximally.  Circumflex was without significant disease.  The right coronary had  tandem 70-80% stenosis.  The PDA filled via collaterals from the left.  Ejection fraction was 35-40%.  The patient denies any anginal type pain  or exertional shortness of breath, although her activities are limited.  She does complain of pains in her arms including her shoulders and her  biceps, but this is not consistent with angina in nature, it is more  constant and nonexertional.   PAST MEDICAL HISTORY:  Significant for pelvic infection and peritonitis,  previous hysterectomy, previous colectomy, a CVA, non-Q-wave MI,  coronary artery disease,  and hypertension.   CURRENT MEDICATIONS:  1. Metoprolol 100 mg b.i.d.  2. Iron 65 mg b.i.d.  3. Aspirin 81 mg daily.  4. Seroquel 25 mg nightly.  5. Amlodipine 5 mg daily.  6. Crestor 20 mg nightly.  7. Tramadol 50 mg q.12 Lopez. for pain.  8. Twynsta 40/5 mg daily.  9. Amlodipine has been discontinued.   ALLERGIES:  There is no known drug allergies.   FAMILY HISTORY:  Noncontributory.   SOCIAL HISTORY:  She is retired.  She is married.  She is a nonsmoker.  She does not drink alcohol.   REVIEW OF SYSTEMS:  See HPI, otherwise negative.   PHYSICAL EXAMINATION:  GENERAL:  The patient is an anxious 70 year old  woman.  VITAL SIGNS:  Blood pressure is 138/87, pulse 77, respirations 18, and  her oxygen saturation is 97% on room air.  NEUROLOGIC:  She is alert and oriented x3, but again very anxious.  There is no discrete focal deficits.  HEENT:  Unremarkable.  NECK:  Without adenopathy, thyromegaly, or bruits.  CARDIAC:  Regular rate and rhythm.  Normal S1 and S2.  No rubs or  gallops.  LUNGS:  Clear with equal breath sounds bilaterally.  ABDOMEN:  Well-healed surgical scar.  She has an ostomy in the left  lower quadrant.  EXTREMITIES:  Without clubbing, cyanosis, or edema.  She has 2+ pulses  throughout.   LABORATORY DATA:  Cardiac catheterization and stress test reviewed,  results as previously noted.   IMPRESSION:  The patient is a 70 year old woman,  who has essentially  three-vessel disease.  She has involvement of the ramus intermedius  rather than the circumflex proper, but also involving the left anterior  descending and right coronary systems with impaired left ventricular  function with a previous non-Q-wave myocardial infarction, coronary  artery bypass grafting is indicated for survival benefit despite absence  of symptoms.  She has not been cleared from Cardiology to have her  ostomy takedown until she has bypass surgery.  I did have question for  Dr. Michaell Cowing  regarding whether this was a loop or end colostomy and degree  of surgery needed to take this down.  However, he is out of town this  week, but from reviewing his operative notes, she had essentially a low  anterior resection and an end ostomy therefore will be a laparotomy  rather than a more local takedown of a loop colostomy, so I would be in  agreement that coronary artery bypass grafting is warranted prior to  undergoing that operation.   I discussed with the patient and her husband that the operation does  carry increased risk in the setting of an ostomy particularly in regards  to infection, particularly in regards to sternal osteomyelitis or  mediastinitis.  They understand the general nature of the operation,  expected hospital stay, and overall recovery.  They understand the risks  of surgery to include, but are not limited to death, stroke, myocardial  infarction, deep vein thrombosis, pulmonary embolism, bleeding, possible  need for transfusions, infections including mediastinitis, leg wound  infections, or other unpredictable complications.  She also understands  the risk of respiratory, renal, hepatic, or gastrointestinal  complications.  She understands and accepts these risks.  We will  tentatively schedule her surgery for Tuesday, April 22, 2008.  She would  be admitted on the day of surgery.   Salvatore Decent [name], M.D.  Electronically Signed   SCH/MEDQ  D:  04/14/2008  T:  04/15/2008  Job:  [id number]   cc:   Elmore Guise., M.D.  Ardeth Sportsman, MD  Tammy R. Collins [place], M.D.

## 2010-06-01 NOTE — Discharge Summary (Signed)
[name] NO.:  [id number]   MEDICAL RECORD NO.:  [id number]          PATIENT TYPE:  INP   LOCATION:  2913                         FACILITY:  [facility]   PHYSICIAN:  Felipa Evener, MD  DATE OF BIRTH:  Jan 13, 1941   DATE OF ADMISSION:  10/17/2007  DATE OF DISCHARGE:  11/13/2007                               DISCHARGE SUMMARY   FINAL DIAGNOSES:  As follows:  1. 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  2. Pulmonary edema.  3. Cardiopulmonary arrest.  4. Acute coronary syndrome.  5. Status post exploratory laparotomy.  6. Anemia (iron deficiency).  7. Pelvic abscess.  8. Resolved sepsis/septic shock.  9. Hyperglycemia.   LABORATORY DATA:  November 13, 2007, sodium 143, potassium 4.3, chloride  108, CO2 of 27, glucose 143, BUN 16, creatinine 0.47, calcium 8.7,  magnesium 1.5, phosphate 4.7, hemoglobin 9.7, hematocrit 28.6, white  blood cell count 8.6, platelet count 444.   MICROBIOLOGY:  Blood cultures x2 on November 04, 2007, negative.  Urine  culture on November 07, 2007, negative.  Urine culture on November 04, 2007, demonstrating greater than 100,000 colonies per mL of Klebsiella  oxytoca.  This was a Programme researcher, broadcasting/film/video.  She also had Enterobacter  cloacae on that urinary culture.  Sputum culture on November 04, 2007,  demonstrated moderate Klebsiella oxytoca.  Bronchioalveolar lavage on  November 02, 2007, negative.   RADIOLOGY:  Chest x-ray on November 13, 2007, overall improved aeration,  decreased pulmonary edema with persistent bibasilar atelectasis,  tracheostomy tube in satisfactory position.  EEG obtained on November 08, 2007, demonstrates low-voltage activity, nonreactive monotonous  background of low voltage consistent with coma.   PROCEDURES:  1. November 01, 2007, percutaneous tracheostomy.  2. October 22, 2007, exploratory laparotomy with sigmoid  colectomy and      end descending colostomy, as well as gastrostomy tube placement.      This was completed by Dr. Michaell Cowing.   BRIEF HISTORY:  This is a 70 year old female patient who was transferred  to [facility] from [facility] intubated with severe  sepsis/septic shock, pulmonary edema in the setting of cardiogenic  shock, and acute coronary syndrome with elevated troponins, and ST  depression on EKG.  She was initially admitted to [facility] on  October 15, 2007, for a pelvic mass.  She underwent surgery,  postoperatively had hypotension, tachycardia, and increased FIO2 demand  requiring intubation and exploratory laparotomy.  She ultimately  underwent total abdominal hysterectomy, bilateral salpingo-oophorectomy,  and appendectomy for bilateral tubal abscesses in the setting of  retained IUD (inserted 30 years ago).  She was transferred to [facility] for definitive therapy.  Initial CT of abdomen showed free air  in the abdomen.  On October 21, 2007, she was found to have a perforated  bowel.  She therefore underwent a repeat exploratory laparotomy.  On  November 04, 2007, she demonstrated to have abdominal abscesses.  Her  hospital course was complicated by respiratory arrest thought to be  secondary to mucus plugging on November 08, 2007, and right-sided CVA  following that.   ADDITIONAL PROCEDURES:  Include:  1. Endotracheal tube placed October 16, 2007, removed November 01, 2007.  2. Right subclavian triple lumen catheter placed October 16, 2007,      removed October 22, 2007.  3. Right radial A-line placed October 16, 2007, removed October 19, 2007.  4. PICC line placed October 22, 2007.  5. Tracheostomy placed November 01, 2007.   HOSPITAL COURSE BY DISCHARGE DIAGNOSES:  1. Acute respiratory failure, now ventilator/tracheostomy-dependent      respiratory failure following severe sepsis, septic shock in the      setting of  abdominal peritonitis in the setting of perforated      bowel.  [name] has had prolonged requirement for mechanical      ventilation and therefore underwent tracheostomy placement as noted      on November 01, 2007, to facilitate weaning.  Her hospital course      has been complicated by diffuse pulmonary infiltrates, most likely      secondary to pulmonary edema in the setting of cardiogenic shock,      as well as probably some element of acute lung injury.  Upon time      of dictation, she now remains on ventilatory support, she does have      a tracheostomy in place.  This was inserted percutaneously again on      November 01, 2007.  At this time, focus of care is on ventilatory      weaning.  Upon time of dictation, she is currently on a PEEP of 5      and pressure support of 5 requiring only 40% FIO2 with good      ventilatory mechanics on these settings.  From a pulmonary      standpoint, acute goals have been to diurese as tolerated.  This      has assisted her ventilator mechanics nicely.  It is the hope that      in a short term she will be able to be liberated from mechanical      ventilation and then further evaluation for tracheostomy downsized      and potential decannulation as supported by mental status and      functional status.  2. Klebsiella pneumonia.  This was treated with a full course of      antibiotic therapy with Zosyn from October 17, 2007, to October 30, 2007.  3. Acute coronary syndrome, probably secondary to demand ischemia.      This has been treated primarily medically with aspirin and      Lopressor.  4. Cardiac arrest.  This was felt to be secondary to mucus plugging,      there was an initial respiratory arrest leading to a brief episode      of bradycardia and asystole that she responded to advanced cardiac      life support measures and again at this point the focus has been on      medical therapy.  5. Post anoxic encephalopathy, ischemic  stroke.  Status post cardiac      arrest.  She does have some residual weakness.  EEG has been      obtained with results above.  Neurology has been following.  She is      improving cognitively.  Plan again will be to focus on  rehabilitation.  6. Status post exploratory laparotomy.  This has been followed by      surgical services and plan would be to continue wound care as      outlined.  7. Pelvic abscess, as well as multiple organism urinary tract      infection.  Currently, is on day 6/14 imipenem and 9/14 Cancidas.      Last CT of pelvis demonstrated no abscess.  This is down from a      less than 3-cm abscess in prior CT scan.  Surgical services had      been following.  8. Hyperglycemia.  Plan for this is to continue sliding scale insulin.   DISCHARGE MEDICATIONS:  1. Primaxin 500 mg IV every 8 hours.  2. Cancidas 50 mg IV every 24 hours.  3. Lopressor 50 mg via tube b.i.d.  4. Aspirin 81 mg daily.  5. Protonix 40 mg daily.  6. Jevity tube feedings 70 mL an hour.  7. D5 half normal at 20 mL an hour.  8. Ferrous sulfate 300 mg via tube q.12.  9. Sodium flush for PICC line p.r.n.  10.Heparin 5000 units subcu t.i.d.  11.Lantus insulin 12 units q.12.  12.Peridex 0.12% oral rinse 15 mL p.o. every 12 hours.  13.Free water 100 mL via tube every 6 hours.  14.Sliding scale insulin per ICU protocol.  15.P.r.n. Haldol 2 to 4 mg IV every 2 hours p.r.n.  16.Fentanyl 15 mcg IV every 2 hours.  17.Tylenol 325 mg 2 tabs via tube every 4 hours p.r.n.   DIET:  Jevity tube feed 70 mL an hour via PEG tube.   ACTIVITY:  Cleared for full physical therapy and rehabilitation therapy  services at the discretion of attending physician at [facility].   VENTILATOR SETTINGS:  Rest mode is PRBC, tidal volume 500, rate 12, PEEP  of 5, FIO2 40%.  She is currently cycling on pressure support of 5, PEEP  of five, FIO2 of 40%.  She is free to continue ventilator weaning at the   discretion of attending physician at [facility].   DISPOSITION:  [name] has met maximum benefit from inpatient care.  She will be cleared medically for discharge to [facility] at  this point.      Zenia Resides, NP      Marius Ditch Jefm Miles, MD  Electronically Signed    PB/MEDQ  D:  11/13/2007  T:  11/13/2007  Job:  [id number]

## 2010-06-01 NOTE — Procedures (Signed)
EEG NUMBER:  [id number].   CLINICAL HISTORY:  The patient is a 70 year old status post cardiac  arrest on ventilator.  She is unresponsive.  She had surgery for pelvic  inflammatory mass.  Study is being done to look for prognosis.  (348.1,  780.01)   PROCEDURE:  The tracing is carried out of 32-channel digital Cadwell  recorder reportedly in the Intensive Care Unit.  The International 10/20  system lead placement was used.  Medications include aspirin, atropine,  insulin, ferrous sulfate, Lantus, Ativan, Lopressor, Protonix, Tylenol,  Haldol, Zofran, and Levophed.  The International 10/20 system lead  placement was used.   DESCRIPTION OF FINDINGS:  The background is a monotonous 110 microvolt  alpha and beta range activity.  The background is not reactive.  There  was no evidence of focal slowing and no interictal epileptiform activity  seen.  EKG showed a sinus tachycardia 120 beats per minute.   IMPRESSION:  This EEG shows excessive low-voltage activity and  nonreactive monotonous background of very low-voltage theta activity  consistent with theta coma.  The prognosis in this clinical setting is  poor for neurological recovery.      Deanna Artis. Sharene Skeans, M.D.  Electronically Signed     NFA:OZHY  D:  11/08/2007 23:58:00  T:  11/09/2007 03:29:50  Job #:  [id number]   cc:   Kalman Shan, MD  [address] [place] [address]  [place] [address]

## 2010-06-01 NOTE — Op Note (Signed)
[name], [name] NO.:  [id number]   MEDICAL RECORD NO.:  [id number]          PATIENT TYPE:  INP   LOCATION:  2304                         FACILITY:  [facility]   PHYSICIAN:  Burna Forts, M.D.DATE OF BIRTH:  [birth year]   DATE OF PROCEDURE:  DATE OF DISCHARGE:                               OPERATIVE REPORT   Intraoperative transesophageal echocardiographic report.   INDICATIONS FOR PROCEDURE:  [name] is a 70 year old who presents  today for coronary artery bypass grafting.  After initial placement of  the PA catheter, we noted a very high right-sided pressures, and we  therefore were asked by the attending surgeon, Dr. [name], to place  the TEE probe for evaluation of cardiac structures and function.  After  induction of anesthesia, the TEE probe was protected, lubricated, and  passed oropharyngeally without difficulty into the stomach and slightly  withdrawn for imaging of the cardiac structures.   PRECARDIOPULMONARY BYPASS EXAMINATION:  1. Left ventricle.  The left ventricular chamber is seen in a short      axis view in the initial placement.  There is a mildly dilated left      ventricular chamber appreciated with some hypocontractile segments      appreciated immediately.  There was overall depressed left      ventricular function.  There is septal posterior and inferior wall      hypokinesis with significant inferior hypokinesis appreciated.      There are no masses noted within.  Long-axis review again reveals      that inferior and posterior wall hypokinesis.  2. Mitral valve:  The mitral valve is initially seen in a four-chamber      view.  They are both thickened leaflets, both anterior and      posterior.  There appears to be a suggestion of a slight      restriction of mobility appreciated in the posterior leaflet.      Therefore, on color Doppler examination what we then saw was a very      large regurgitant jet that appeared  repaired over the top of that      posterior leaflet below the anterior leaflet that went well into      the deep aspects of the left atrial chamber that layered out along      the posterolateral atrial wall.  The jet was such as to be      estimated to be 4+.  Multiple views were obtained of the mitral      valve.  Other than thickening, there was no prolapse, there was no      flow segment to the significant mitral regurgitant.  Flow that was      seen on screen appear to be due to the restriction and mobility of      that posterior leaflet area.  3. Aortic valve:  There are 3 cusps appreciated.  There is significant      aortic sclerosis; however, no aortic stenosis is appreciated.  4. Right ventricle.  The right ventricular chamber is a mildly dilated  chamber with some mild diminution and contractile pattern      appreciated.  5. Tricuspid valve.  Normal-appearing valve with 1+ tricuspid      regurgitant flow noted.   The patient was placed on cardiopulmonary bypass.  Coronary artery  bypass grafting is carried out followed by a ring repair of the mitral  valve by Dr. [name] (additionally what was seen at the time of pre-  TEE exam was a small PFO associated with a left to right shunt).   POSTCARDIOPULMONARY TEE EXAMINATION:  1. Left ventricle.  The left ventricular chamber seen in a short axis      view in this early bypass.  The septal wall area is significantly      dyskinetic in the early bypass.  There is good overall      contractility appreciated anteriorly and anterolaterally; however,      the inferior and posterior wall still appear hypocontractile in      this early bypass period.  With time, with inotropes, and with      separation from cardiopulmonary bypass, these areas appear somewhat      improved with overall improved left ventricular contractile pattern      appreciated some 20-25 minutes after separation from      cardiopulmonary bypass.  2. Mitral  valve.  The ring apparatus is well visualized.  It appeared      seated in the annular area appropriately.  Multiple views were      obtained.  There still is some residual mitral regurgitant flow      appreciated, but this is no more than 1+ to 1-1/2+ compared to the      severe regurgitant flow appreciated in the prebypass.  Multiple      views again are obtained.  The jets are in fact too small      commissural jets appreciated in a commissural view of the mitral      valve.   The rest of the cardiac examination was as previously described.  The  patient was ultimately returned to the cardiac intensive care unit in  stable condition.           ______________________________  Burna Forts, M.D.     JTM/MEDQ  D:  04/23/2008  T:  04/24/2008  Job:  [id number]

## 2010-06-01 NOTE — Cardiovascular Report (Signed)
NAMEDANILLE, OPPEDISANO NO.:  [id number]   MEDICAL RECORD NO.:  [id number]          PATIENT TYPE:  OIB   LOCATION:  2899                         FACILITY:  [facility]   PHYSICIAN:  Elmore Guise., M.D.DATE OF BIRTH:  11/15/1940   DATE OF PROCEDURE:  04/08/2008  DATE OF DISCHARGE:  04/08/2008                            CARDIAC CATHETERIZATION   INDICATION FOR PROCEDURE:  Abnormal stress test.   HISTORY OF PRESENT ILLNESS:  Ms. Rominger is a very pleasant 70 year old  white female with past medical history of hypertension, dyslipidemia who  initially presented for Cardiology clearance prior to colostomy  takedown.  The patient has a past history of non-ST-elevation myocardial  infarction in a setting of sepsis syndrome when she was hospitalized  last fall.  Because of MI history she was referred for a myocardial  perfusion study.  Her stress test came back with a moderate size  reversible inferolateral defect.  She is now referred for cardiac  catheterization.   DESCRIPTION OF PROCEDURE:  The patient brought to the Cardiac Cath Lab  and after appropriate informed consent, she was prepped and draped in  sterile fashion.  Approximately 10 mL of 1% lidocaine was used for local  anesthesia.  A 5-French sheath was placed in the right femoral artery  without difficulty.  Coronary angiography, LV angiography were then  performed.  The patient tolerated the procedure well and was transferred  from the Cardiac Cath Lab in stable condition.   FINDINGS:  1. Left Main:  Mild distal tapering.  2. LAD:  Mild proximal luminal irregularities with mid 50% long lesion      after bifurcation of first diagonal.  There was a distal 70-80%      stenosis noted.  First diagonal is patent with mild luminal      irregularities.  3. LCX:  Nondominant with mild luminal irregularities.  4. Ramus intermedius has a proximal subtotal occlusion.  5. RCA:  Dominant with tandem 70-80% stenosis.   PDA fills via      collaterals from the left system.  6. EF 40% with moderate basal/mid inferior wall hypokinesis.  LVEDP is      17 mmHg.  7. Limited right femoral angiography shows no significant femoral      disease.  Arteriotomy site with good position.   IMPRESSION:  1. Multivessel coronary artery disease.  2. Mild left ventricular dysfunction, ejection fraction 40%.   PLAN:  At this time, we will continue aggressive medical therapy.  She  is on metoprolol, Crestor, and aspirin.  We will give her 48 hours prior  to starting ACE inhibitor.  Because of the diffuse disease, I would  recommend CT surgery consult for revascularization prior to her elective  colostomy takedown procedure.      Elmore Guise., M.D.  Electronically Signed     TWK/MEDQ  D:  04/08/2008  T:  04/09/2008  Job:  [id number]   cc:   Ardeth Sportsman, MD

## 2010-06-01 NOTE — Op Note (Signed)
NAMECHEA, MALAN NO.:  [id number]   MEDICAL RECORD NO.:  [id number]          PATIENT TYPE:  INP   LOCATION:  2913                         FACILITY:  [facility]   PHYSICIAN:  Ardeth Sportsman, MD     DATE OF BIRTH:  February 24, 1940   DATE OF PROCEDURE:  10/22/2007  DATE OF DISCHARGE:                               OPERATIVE REPORT   PRIMARY CARE PHYSICIAN:  Tammy R. Collins Scotland, MD   GYNECOLOGIST:  Maris Berger. Pennie Rushing, MD   CRITICAL CARE PHYSICIAN:  Kalman Shan, MD   SURGEON:  Ardeth Sportsman, MD   ASSISTANT:  Troy Sine. Dwain Sarna, MD   PREOPERATIVE DIAGNOSES:  Pneumoperitoneum, status post abdominal  hysterectomy/bilateral salpingo-oophorectomy/appendectomy for tubo-  ovarian abscesses, periappendicitis secondary to retained intrauterine  device.   POSTOPERATIVE DIAGNOSIS:  Distal sigmoid anterior perforation, status  post above.   PROCEDURES PERFORMED:  1. Exploratory laparotomy.  2. Sigmoid colectomy with end-descending colostomy.  3. Gastrostomy tube placement (24-French Malecot).   ANESTHESIA:  General anesthesia.   SPECIMENS:  1. Sigmoid colon.  2. Peritoneal strippings for culture.   INDICATIONS:  Ms. Putman is a 70 year old female who had 4 months of  worsening malaise and was found to have a large pelvic inflammatory  mass.  She underwent abdominal hysterectomy and bilateral salpingo-  oophorectomy and appendectomy for bilateral tubo-ovarian abscesses and  enlarged inflamed purulent mass.   Postoperatively, she developed into shock with sepsis and ended up  having to be required to be intubated and placed on pressors.  She  stabilized after some time; however, she started spiking fevers.  CAT  scan showed massive free air today and therefore, surgical consultation  was requested.   Pathophysiology and differential diagnosis of pneumoperitoneum with  peritonitis, septic shock, and other risks were discussed with the  patient's husband on the  phone.  Options discussed.  Recommendation was  for emergent exploration of the abdomen.  Risks, benefits, and  alternatives were discussed.  The patient was in very grim situation.  She recently had a heart attack and is on a ventilator giving her at  least two organ failure.  However, the options of no surgery are near  100% mortality secondary to diffuse peritonitis from a perforated  viscus.  The patient's husband agreed to proceed.  The patient was  paralyzed and intubated and was not able to consent herself.  I tried to  reach the patient's brother per the husband's request, but was not able  to reach him.   OPERATIVE FINDINGS:  The distal sigmoid and proximal rectum had dense  inflammatory peel and inflammation from the prior massive pelvic  inflammatory mass.  Anteriorly, there was some thinning out of the wall  with a pinhole perforation and spillage of feces.  The patient had fecal  peritonitis in all 4 quadrants with a large volume of ascites and peel  and intra-loop abscesses.  The small bowel and colon was otherwise  viable.  The stomach and sweep of the duodenum looked normal, arguing  against a duodenal perforation.  The retroperitoneum otherwise appeared  to be normal.  The  vaginal cuff repair appeared to be intact.  The  bladder appeared to be normal.   DESCRIPTION OF PROCEDURE:  Informed signed consent was confirmed.  The  patient was already on IV vancomycin, Zosyn, and fluconazole, so I did  not add anything else on top of that.  She already camein intubated.  She had a central line and anterior line placed by Dr. Gelene Mink,  Anesthesia and his team.  See their notes for further details.   The patient was positioned supine with arms out and SCDs were firing.  She had been on a heparin drip, and this was held 3 hours prior to  laparotomy from time of hold to skin incision.  Her abdomen was prepped  and draped in sterile fashion.   We then removed the staples and  opened up the infraumbilical midline  incision.  I encountered a large volume of dirty ascites and down the  pelvis released a pocket of, which looks like feces and old blood.  Copious irrigation was done to clear down the pelvis.  The inflamed  omentum was freed off and some small bowel was freed off.  Looking down  the pelvis, we could see an intact vaginal cuff with a lot of old blood  around it.  The sigmoid looked very inflamed with a inflammotory peel,  but there was no obvious large perforation that could easily be seen.   We thereforeextended laparotomy more cephalad since we sucked out large  volumes of fluid in retrosplenic and retrohepatic spaces.  We ended up  meticulously inspecting the stomach anteriorly and all the way up at the  esophageal hiatus and it appeared to be normal.  There was a small  bleeder on the greater curve, this was controlled using a figure-of-  eight Vicryl stitch.  The OG tube and Panda tube were intact in the  stomach with no evidence of any percutaneous perforation.  The duodenal  sweep looked normal.  We did a mild kocherization and duodenum looked  otherwise normal with no free air, phlegmon, or irritation there.  Copious irrigation was done there as well and things cleared up very  quickly.  Therewas some inflammatory peelthat was feces stained, that we  freed off between some interloop abscesses and especially in left upper  quadrant and sent that off for culture.   The small bowel was run from ligament of Treitz all the way down to the  cecum.  There were numerous interloop adhesions, inflammatory peels, and  these were gently debrided, although we did try not to be overaggressive  lest we start getting some serosal tears.  One small spot had some  bleeding after taking the peel off.  I could not tell if there was a  definite serosal tear but went ahead and imbricated that using a 3-0  silk stitch.  This was in the midjejunum.   The  appendiceal base was inspected and the stitch was intact and the  cecum looked normal.  Cecum all the way down to the descending colon  looked normal as well.   Therewas some persistent drainage down the pelvis.  Therefore, with more  meticulous inspection and freeing some of the cuff off right near that  region, there was a very thick inflammatory peel that I could feel  extending up the anterior wall and with some milking on the anterior  wall, then with trying to mobilize the cecum, sigmoid colon, and rectum,  a pinhole perforation became apparent, and  liquid stool was easily  coming out.   Therefore, I decided to do a segmental resection and freed up and made a  window in the proximal rectum, a few centimeters distal to this pinhole.  We freed off the dense inflammatory peel around the area as well until  we got the normal-looking rectum.  This was transected using a Contour  stapler.  The rectal stump was oversewn as well using an 2-0 Prolene  stitch with long tails.  Extra few short tails were on the right side.   The mesentery was taken in a ray-like fashion more proximally to get  better extension.  We ended up transecting sigmoid colon to more healthy  viable tissue away from some of the inflammation.  There were some  bleeders on the sigmoid mesentery remaining, and these were controlled  using a LigaSure as well as pressure and focused cautery.   A left lower quadrantfascial defect was made at the skin making a 3-cm  diameter circular defect.  We cored out some subcutaneous fat and split  the anterior rectus fascia and split the muscle, and I was able to enter  into the abdominal cavity.  The distal ascending colon stump was brought  out through the wound.   Further copious irrigation was done, another 10L until we freed off as  much inflammation and irritation as possible.  Hemostasis was assured.  Given the massive contamination especially in the left paracolic gutter   down the pelvis, I went ahead and placed 2 drains.  They were noted as  above.  They were secured to the skin using permanent stitches.   I went ahead and placed a gastrostomy using a 24-Malecot placed along  the greater curvature in its most dependent place.  We placed a 3-0  PDSin a pentagonal-shaped stitch, Malecot into the stomach.  The stitch  was tied down, wrapped once around the Malecot tube and tied.  The  stomach was secured to the left upper quadrant after it had been pulled  through a left upper quadrant puncture site.  It was secured using  interrupted silk stitches x5 to good result.  There was no leak.  The  tube was removed since we could not advance it down in the jejunum  secondary to it kept coiling up and not able to be further advanced, was  not able to milk it out further as well.   The omentum was placed down into the pelvis and the skin was closed  using a #1 loop PDS using some large interrupted #1 PDS stitches x6 as  well.  Wound was irrigated copiously with saline.  The skin was loosely  approximated every 5 cm with a staple except around the umbilicus  wherewe put a few more staples just for better cosmesis.  A sterile  covering was applied.   The colostomy was matured using interrupted 3-0 Vicryl stitches with  Brooke stitches placed in the 4 corners of the wound.  Digital  inspection revealed an easy access in the peritoneum at a bit of an  oblique angle but the fascia was open and viable.   Sterile dressing was applied.  The patient was sent to ICU in critical  condition.  Her urine output did go down, but her blood pressure was  pretty stable.   I am about to explain the operative findings to the patient's family.      Ardeth Sportsman, MD  Electronically Signed  SCG/MEDQ  D:  10/22/2007  T:  10/22/2007  Job:  [id number]   cc:   Hal Morales, M.D.  Kalman Shan, MD  Leonie Man, M.D.  Tammy R. Collins Scotland, M.D.

## 2010-06-01 NOTE — Discharge Summary (Signed)
[name], [name]            ACCOUNT NO.:  [id number]   MEDICAL RECORD NO.:  [id number]           PATIENT TYPE:   LOCATION:                                 FACILITY:   PHYSICIAN:  Salvatore Decent. [name], M.D.DATE OF BIRTH:  05-Feb-1940   DATE OF ADMISSION:  DATE OF DISCHARGE:                               DISCHARGE SUMMARY   FINAL DIAGNOSES:  1. Three-vessel coronary artery disease.  2. Severe mitral regurgitation.  3. Patent Foramen ovale.   IN-HOSPITAL DIAGNOSES:  1. Acute blood loss anemia postoperatively.  2. Volume overload postoperatively.  3. Cytopenia postoperatively.   SECONDARY DIAGNOSES:  1. History of pelvic infection and peritonitis.  2. Status post hysterectomy.  3. Status post cholecystectomy.  4. History of cerebrovascular accident.  5. 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  6. Hypertension.   IN-HOSPITAL OPERATIONS AND PROCEDURES:  1. Coronary artery bypass grafting x4 using a left internal mammary      artery to left anterior descending, saphenous vein graft to the      first diagonal, saphenous vein graft to ramus immediate, saphenous      vein graft to the distal right coronary artery.  Endoscopic vein      harvest to the right leg.  2. Mitral valve repair using a 30-mm Edwards Physio II annuloplasty      ring.  3. Closure of patent foramen ovale.   PATIENT'S HISTORY AND PHYSICAL AND HOSPITAL COURSE:  [name] is a  70 year old female with a non-Q-wave myocardial infarction during the  acute illness in the fall.  She is now under evaluation for colostomy  takedown.  She needed cardiology clearance.  Cardiac catheterization was  done which was found to have severe three-vessel coronary artery  disease.  There is 1-2+ mitral valve regurgitation on left  ventriculogram.  The patient was advised to undergo coronary bypass  grafting prior to ostomy takedown.  The patient is to be evaluated by  Dr.  [name].  Dr. [name] discussed with the patient undergoing  coronary artery bypass grafting.  He discussed risks and benefits with  the patient.  The patient acknowledged her understanding and agreed to  proceed.  Surgery was scheduled for April 22, 2008.  For further details  of the patient's past medical history and physical exam, please see the  dictated H and P.   The patient was taken to the operating room on April 22, 2008, where she  underwent coronary artery bypass grafting x4 using a left internal  mammary artery to left anterior descending, saphenous vein graft to  first diagonal, saphenous vein graft to ramus intermedius, saphenous  vein graft to distal right coronary artery.  Endoscopic vein harvest  from the right leg was done.  A TEE was done intraoperatively which  showed severe mitral regurgitation as well as PFO.  At that time, it was  felt the patient would require mitral valve repair.  She did have a  mitral valve repair using a 30-mm Edwards Physio II annuloplasty ring.  She also underwent closure of patent foramen ovale.  She tolerated the  procedure well and was transferred to the intensive care unit in stable  condition.  Postoperatively, the patient was noted to be hemodynamically  stable.  She was extubated evening of surgery.  Postextubation, the  patient was noted to be alert and oriented x4.  Neuro intact.  Postoperatively, the patient was noted to be in normal sinus rhythm.  Blood pressure was stable.  All drips were able to be weaned and  discontinued.  She was started on low-dose beta-blocker.  The patient  tolerated this well.  She is currently in normal sinus rhythm.  The  patient did have a chest x-ray done postoperatively which was clear.  She had minimal drainage from chest tubes, and chest tubes were  discontinued in normal fashion.  Postoperatively while in the intensive  care unit, the patient did develop acute blood loss anemia with   hemoglobin and hematocrit dropping to 7.6 and 22.3.  The patient then  received 2 units of packed red blood cells.  Her hemoglobin and  hematocrit improved appropriately the following day 9.4 and 27%.  Monitoring her hemoglobin and hematocrit, her platelet count was noted  to be decreasing.  She developed thrombocytopenia.  Current platelet  count is 89.  Lovenox was placed on hold.  We will continue to monitor.  Postoperatively, the patient was out of bed, ambulating with Cardiac  Rehab.  She required assistance but was progressing.  All incisions were  noted to be clean, dry, and intact and healing well.  The patient was  tolerating diet well, and no nausea or vomiting noted.  The patient did  have some volume overload postoperatively requiring several doses of  diuretics.  Daily weights obtained.  Her volume overload was improving  slowly.  The patient was felt to be stable and ready for transfer out to  2000 on postop day #3.   On postop day #3, vital signs noted to be stable.  She is afebrile.  She  is sating greater than 90% on room air.  Most recent lab work shows a  white count 7.4, hemoglobin 9.4, hematocrit 27%, platelet count 89.  Sodium of 141, potassium 3.4, chloride of 110, bicarbonate 26, BUN of  15, creatinine 0.7, glucose 116.  She is in normal sinus rhythm.  Pulmonary status is stable.  She is felt to be ready for discharge to  home in the next 2-3 days pending she remains stable.   FOLLOWUP APPOINTMENTS:  A followup appointment has arranged with Dr.  [name] for May 12, 2008, at 12:45 p.m.  The patient will need to  obtain PA and lateral chest x-ray 30 minutes prior to this appointment.  The patient will need to follow up with Dr. [name] in 2 weeks.  She  needs to contact Dr. Silva Bandy office to make these arrangements.   ACTIVITY:  The patient is instructed no driving until released to do so,  no lifting over 10 pounds.  She is told to ambulate 3-4 times per  day,  progress as tolerated, and continue her breathing exercises.   INCISIONAL CARE:  The patient is told to shower washing her incisions  using soap and water.  She is to contact the office if she develops any  drainage or opening from any of her incision sites.   DIET:  The patient indicated on diet to be heart healthy.   DISCHARGE MEDICATIONS:  1. Iron 65 mg b.i.d.  2. Seroquel 25 mg at night.  3.  Crestor 20 mg at night.  4. Aspirin 325 mg daily.  5. Toprol-XL 25 mg daily.  6. Lasix 40 mg daily x7 days.  7. Potassium chloride 20 mEq daily x7 days.  8. Oxycodone 5 mg 1-2 tabs p.o. q.4-6 hours p.r.n.       Sol Blazing, PA      Salvatore Decent. [name], M.D.  Electronically Signed    KMD/MEDQ  D:  04/25/2008  T:  04/26/2008  Job:  [id number]   cc:   Elmore Guise., M.D.  Ardeth Sportsman, MD

## 2010-06-01 NOTE — Op Note (Signed)
[name], [name]            ACCOUNT NO.:  [id number]   MEDICAL RECORD NO.:  [id number]          PATIENT TYPE:  INP   LOCATION:  9320                          FACILITY:  [facility]   PHYSICIAN:  Hal Morales, M.D.DATE OF BIRTH:  1940/06/13   DATE OF PROCEDURE:  DATE OF DISCHARGE:                               OPERATIVE REPORT   PREOPERATIVE DIAGNOSES:  Pelvic abscesses, right hydronephrosis,  retained intrauterine contraceptive device.   POSTOPERATIVE DIAGNOSES:  Bilateral tubal ovarian abscesses,  periappendicitis, retained intrauterine device, right hydronephrosis,  and extensive pelvic adhesions.   PROCEDURES:  Exploratory laparotomy, lysis of adhesions, total abdominal  hysterectomy, bilateral salpingo-oophorectomy, pelvic washings, abscess  culture, appendectomy.   SURGEON:  Hal Morales, MD   FIRST ASSISTANT:  Leonie Man, MD   ANESTHESIA:  General orotracheal.   ESTIMATED BLOOD LOSS:  600 mL.   COMPLICATIONS:  None.   FINDINGS:  The uterus, tubes, and ovaries were densely adherent in a  mass with uterus and bilateral tubo-ovarian abscesses.  The sigmoid  colon was densely adherent to the posterior uterus.  The appendix was  adherent to the uterine fundus with inflammation in the periappendiceal  fat.   PROCEDURE:  The patient was taken to the operating room after  appropriate identification and placed on the operating table.  After the  attainment of adequate general anesthesia, the abdomen, perineum, and  vagina were prepped with multiple layers of Betadine.  A Foley catheter  was inserted into the bladder and connected to straight drainage.  The  abdomen was draped as sterile field.  A midline incision was made in the  abdomen and the abdomen opened in layers.  Pelvic washings were obtained  at the time of opening the peritoneal cavity.  The self-retaining  O'Connor-O'Sullivan retractor was placed in the abdominal incision.  A  combination of blunt  and sharp dissection allowed the overlying small  intestine to be dissected off along with the appendix.  This was packed  cephalad.  The sigmoid colon was then sharply and bluntly dissected off  the posterior uterus and off the tubal ovarian abscesses.  As the  sigmoid colon was freed up, it was packed cephalad as well.  Continued  lysis of adhesions allowed for identification of the right round  ligament.  This was suture ligated and incised.  The anterior leaf of  the broad ligament was incised caudad.  The further dissection of the  adhesions allowed identification of the left round ligament and it was  likewise suture ligated and incised with the anterior leaf of the broad  ligament being incised down to meet the other side.  The bladder was  bluntly dissected off the anterior cervix.  The retroperitoneal space on  the right was then entered and a combination of blunt and sharp  dissection allowed identification of the right ureter.  Once it had been  identified and protected, the infundibulopelvic ligament on the right  was clamped, cut, and suture ligated.  The sigmoid colon was further  dissected off the left tubal ovarian abscess to the point that the left  ureter could  be identified.  It was protected and the infundibulopelvic  ligament dissected out, clamped, cut, and suture ligated.  The tubo-  ovarian abscess on the left side was then separated from the uterus with  a combination of blunt and sharp dissection.  The inflamed tissue  containing the utero-ovarian ligament and the tube were then clamped,  cut, and suture ligated separating the tubo-ovarian abscess from the  upper uterus.  A similar procedure was carried out on the right side.  The right and left uterine arteries were then clamped, cut, and suture  ligated.  The parametrial tissues, paracervical tissues, uterosacral  ligaments, and vaginal angles were then successively clamped, cut, and  suture ligated making  certain that the rectum had been dissected off the  posterior cervix.  The uterus was then removed from the operative field.  The vaginal angles were suture ligated and held as were the uterosacral  ligaments.  A suture was then placed in vaginal cuff allowing for  complete closure of the vaginal cuff.  The tubo-ovarian abscess on the  right was then dissected off the pelvic sidewall and then the base  clamped, cut, and suture ligated once it was clear that the right ureter  was protected.  The tubo-ovarian abscess was removed from the operative  field.  A similar procedure was carried out on the left with further  identification of the left ureter allowing a combination of blunt and  sharp dissection to remove the left tubo-ovarian abscess.  Copious  irrigation was carried out and hemostasis achieved with a figure-of-  eight suture in the area of the left uterine artery.  All sutures prior  to that had been 0 Vicryl suture, that suture was 2-0 Vicryl.  Copious  irrigation was carried out and hemostasis thought to be adequate.  Warm  laparotomy packs were then placed in the pelvis and the appendix was  isolated and elevated.  The appendiceal artery was isolated, clamped,  cut, and suture ligated with a suture of 0 Vicryl.  The appendiceal  stump was then clamped, suture ligated, cut, and the appendix removed  from the operative field.  The stump was cauterized and then buried  within the cecum in a serosal suture of 0 Vicryl.  Hemostasis was noted  to be adequate.  Copious irrigation was carried out.  The sutures  holding the uterosacral ligament were tied to each vaginal angle and  then tied together in the midline.  Again, copious irrigation of 3  liters was carried out.   It should be noted that during the dissection involving the tubal  ovarian abscesses, each abscess ruptured with the egress of creamy  yellow fluid.  This fluid was cultured both aerobically and  anaerobically.  At  the end of the procedure, 3 liters of warm sterile  saline were used to irrigate the abdomen and pelvis.  All instruments  were then removed from the peritoneal cavity and the incision was closed  in a single layer incorporating the fascia and peritoneum in a running  stitch from each apex to the midline and the incision and tied in the  midline.  This incision was closed with 0 PDS.  The subcutaneous tissue  was copiously irrigated and made hemostatic with Bovie cautery.  It was  reapproximated with a running suture of 2-0 plain.  Skin staples were  used to close the skin incision.  A sterile dressing was applied and the  patient awakened from general anesthesia, then taken  to the recovery  room in satisfactory condition having tolerated the procedure well with  sponge and instrument counts correct.   SPECIMENS TO PATHOLOGY:  Uterus and cervix, bilateral tubal ovarian  abscesses, appendix, and a portion of necrotic omentum.      Hal Morales, M.D.  Electronically Signed     VPH/MEDQ  D:  10/15/2007  T:  10/15/2007  Job:  [id number]   cc:   Leonie Man, M.D.  [address]  [address]  [place]  [place] [address]   Tammy R. Collins Scotland, M.D.  Fax: [phone]

## 2010-06-01 NOTE — Consult Note (Signed)
NAMEAUGUSTINA, BRADDOCK NO.:  [id number]   MEDICAL RECORD NO.:  [id number]          PATIENT TYPE:  INP   LOCATION:  2913                         FACILITY:  [facility]   PHYSICIAN:  Ardeth Sportsman, MD     DATE OF BIRTH:  Sep 23, 1940   DATE OF CONSULTATION:  DATE OF DISCHARGE:                                 CONSULTATION   PRIMARY CARE PHYSICIAN:  Tammy R. Collins Scotland, MD   GYNECOLOGIST:  Hal Morales, MD   SURGEON:  Ardeth Sportsman, MD   REASON FOR CONSULT:  Massive pneumoperitoneum postop day 6 from  hysterectomy and bilateral salpingo-oophorectomy for infected pelvic  mass, tubo-ovarian abscess secondary to retained IUD.   HISTORY OF PRESENT ILLNESS:  Ms. Poth is a 70 year old female who  has had an inflammatory pelvic mass with fever, chills for several month  and had an workup showing a large pelvic inflammatory mass.  She was  found to have a retained IUD with infection in her uterus and probable  tubo-ovarian abscesses.  The patient was admitted on October 12, 2007.  Dr. Pennie Rushing have taken to the patient to the operating room on October 16, 2007 and performed hysterectomy, bilateral salpingo-oophorectomy and  appendectomy with the help of Dr. Leonie Man.   Postoperatively, she became sick and had high troponins concerning for  myocardial infarction.  She was placed on IV anticoagulation.  She  continued to get sicker and was transferred from [facility] to [facility].  She was intubated and placed on moderate ARDS vent settings,  was actually on pressors for a while.  She has been gradually weaned off  those, but is still sick on the vent.  She started developing fevers.  Basic concerns after Dr. Marchelle Gearing ordered a CAT scan, which came back  this evening with massive free air.  I discussed the case with Dr.  Osborn Coho who is covering for Dr. Pennie Rushing as well.   PAST MEDICAL HISTORY:  1. Hypertension.  2. Gastroesophageal reflux  disease.  3. 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  4. History of urinary tract infection in August 2009.  5. Bells palsy in 2008.   PAST SURGICAL HISTORY:  She had complete dental extraction done in 2004.  She had a exploratory laparotomy, lysis of adhesions, total abdominal  hysterectomy, bilateral salpingo-oophorectomy, pelvic washings abscess  culture, and appendectomy on October 15, 2007.   SOCIAL HISTORY:  She is married for last 7 years, unemployed, works as  Engineer, petroleum.  I do not think she has significant tobacco, alcohol  or drug use.   FAMILY HISTORY:  Negative for some hypertension and diabetes in the  family and melanoma as well.   MEDICATIONS:  At home, she was taking potassium, atenolol,  hydrochlorothiazide, and multivitamin.  More recently, she has been on  vancomycin, Zosyn, fluconazole.  She was paralyzed, on Peridex.  She is  on full anticoagulation with heparin, fentanyl, Haldol.  She had been on  Levophed recently off.   ALLERGIES:  No known.   REVIEW OF SYSTEMS:  Unobtainable since the patient is intubated and  paralyzed.   PHYSICAL EXAMINATION:  VITAL SIGNS:  Temperature of 102.1, pulse is in  the 80s, systolic blood pressure has been in the 70s-100s.  GENERAL:  She is a well-developed, malnourished, obese female lying in  bed.  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  I could not get more out of that.  EYES:  Pupils are equal and round.  They were not fixed and dilated.  HENT:  She is intubated and edentulous.  Her nasopharynx and oropharynx  are clear.  NECK:  Supple.  No masses.  Trachea is midline.  HEART:  Regular rate and rhythm.  No murmurs, clicks, or rubs.  CHEST:  Clear to auscultation to bilaterally.  No wheezes, rales or  rhonchi.   ABDOMEN:  Distended, but actually soft.  Her incisions are well healed  with no obvious cellulitis.  GENITAL:  Normal external female genitalia.  RECTAL:  Deferred.  LYMPH:  No head, neck, axillary or groin lymphadenopathy.  EXTREMITIES:  Seems to have normal shoulders, elbows, wrists, hip,  knees, and ankles as far as passive range of motion.  SKIN:  No obvious petechia or purpura.  No other source of lesions.   STUDIES:  1. Her white count is 4.2 earlier today, hemoglobin has been between 9      and 10.  LFTs are in the normal range.  Her albumin is 1.2,      potassium 3.4, BUN of 24, and creatinine of 0.89.  2. CT and KUB shows dilated loop of colon and dilated large pocket of      gas most consistent with dilated sigmoid versus stomach, hard to      say for certain, though looking back Dr. Molli Posey wonders if this is      free air, with radiology.  3. CT scan of the abdomen and pelvis shows massive free air several      centimeters anteriorly.  As, she has decompressed sigmoid colon.      As, she has lot of pockets of air and gas down in her pelvis as      well, status post hysterectomy.  She has a duodenal diverticulum.      There is some fluid up in the upper abdomen.  There is no contrast      filling however down the pelvis.  She has contrast in the free      fluid.   ASSESSMENT AND PLAN:  1. Free fluid and free air in the abdomen with contrast in it with      patient who with in shock on moderate vent settings adult      respiratory distress syndrome and recent myocardial infarction.   This is a very difficult situation.  I talked to patient's husband  Keanu Frickey, and told him the current situation and mortalities 100%  without surgery but with surgery is still very high.  However, options  were rather limited.   I discussed the case with Dr. Osborn Coho with gynecology who was  covering for Dr. Pennie Rushing.  1. I discussed the case with Dr. Marchelle Gearing with pulmonary  critical      care.  2. I discussed the case with Dr. Gelene Mink with anesthesia as well.  3. I. Discussed with pharmacy about holding  IV heparin.  I will plan      to define why I am not continuing IV heparin.  4. Extra IV fluid.  Try to switch over all the way.  5. Exploratory laparotomy later tonight once anticoagulation is more      held.  6. Hold anticoagulation and wait on surgery at least for a few hours.  7. Type and cross just in case.  8. Keep on full vent settings.  9. Probable colostomy will be needed as I suspected the perforated      sigmoid colon versus Graham patch for a perforated ulcer.  We will      also look to put a feeding gastrostomy and jejunostomy tube in her      well since she is going to be chronically sick and needs some type      of enteral feedings to help her recover.   Per the husband's request, I tried to reach the patient's brother, but I  have not had any luck as of yet.  I will try again.      Ardeth Sportsman, MD  Electronically Signed     SCG/MEDQ  D:  10/21/2007  T:  10/22/2007  Job:  [id number]   cc:   Tammy R. Collins Scotland, M.D.  Hal Morales, M.D.

## 2010-06-01 NOTE — H&P (Signed)
[name], [name]            ACCOUNT NO.:  [id number]   MEDICAL RECORD NO.:  [id number]          PATIENT TYPE:  AMB   LOCATION:  SDC                           FACILITY:  [facility]   PHYSICIAN:  Hal Morales, M.D.DATE OF BIRTH:  May 08, 1940   DATE OF ADMISSION:  10/15/2007  DATE OF DISCHARGE:                              HISTORY & PHYSICAL   HISTORY OF PRESENT ILLNESS:  The patient is a 70 year old para 3-0-0-3  who presents for definitive therapy of an apparent inflammatory pelvic  mass.  The history of this illness dates back approximately 4 months  when the patient began feeling generally ill with malaise, fatigue, loss  of appetite and general sense of poor well-being.  She was seen  initially by her primary care physician who treated her with Tamiflu  with no improvement in her symptoms.  She changed primary care  physicians and was evaluated with the finding of an urinary tract  infection and a positive culture for E. coli.  The patient was initially  treated with Cipro, then was treated with an additional 10 days of  Levaquin with marked improvement in her symptomatology.  On a follow up  visit, the patient underwent a pelvic examination and Pap smear which  revealed a pelvic mass which was essentially asymptomatic.  Her Pap  smear was normal and further evaluation of the pelvic mass with a pelvic  ultrasound.  On ultrasound, there was uterine enlargement with scattered  myometrial calcifications consistent with degenerating fibroids.  A  measurable fibroids was identified measuring 3.8 cm in greatest  dimension in the posterior left uterine body.  There was a right adnexal  hypoechoic mass suggestive of a complex right ovarian cyst.  Cholelithiasis was noted.  An MRI was recommended for further  delineation.  Pelvic MRI revealed bilateral tubal ovarian abscesses,  right pelvic cul-de-sac abscess, and inflammatory necrotic mass of the  uterus either containing air or lying  adjacent to air within the  endometrial cavity.  An IUD was identified.  A subsequent CT scan showed  bilateral tuboovarian abscesses with an infectious process involving the  uterus with foci of myometrial or endometrial gas and ill-defined  hypoattenuating lesions.  An IUD was again identified.  The actual bowel  was not involved, though there was some sigmoid diverticulosis noted.  There was no bowel obstruction.  The right hemipelvis did contain ill-  defined gas soft tissue density and a right hydrosalpinx or pyosalpinx.  There was no convincing evidence of appendicitis, though the appendix  may have been involved in the mass.  It did not seem to be the  originator of the mass.  There was no retroperitoneal or retrocrural  adenopathy.  There was hydronephrosis and hydroureter on the right side  which was thought to be mild.   LABORATORY DATA:  The patient underwent laboratory studies which  included a CA-125 of 32.7, thought to be mildly elevated.  A BMP which  was within normal limits except for a potassium of 3.4,  glucose of 177,  a sed rate of 93, a white blood cell count of 8.8,  hemoglobin 11.6 and a  platelet count of 405,000.  The patient had a repeat urine culture on  September 26, 2007 which showed no growth.   CONSULTATIONS:  On October 03, 2007, the patient underwent a  consultation with Dr. De Blanch of the [facility] who felt that  there was no convincing evidence for malignancy, but that this most  likely represented a chronic inflammatory process and that there was the  need to identify and remove the mass particularly in light of the  finding of hydronephrosis and hydroureter.   PAST MEDICAL HISTORY:  The patient was diagnosed with hypertension in  March 2009 as well as gastroesophageal reflux disease.  She had an  episode of Bell's palsy in June 2008.  She had the aforementioned  urinary tract infection in August 2009.    OBSTETRICAL HISTORY:  The patient had 3 normal spontaneous vaginal  deliveries, the last of them 39 years ago.   GYNECOLOGICAL HISTORY:  The patient had an IUD placed 30 years ago and  has had no GYN followup since that time until now.   SURGICAL HISTORY:  The patient had a complete dental extraction in 2004,  but no other surgical procedures.   FAMILY HISTORY:  Positive for melanoma, hypertension and diabetes.   CURRENT MEDICATIONS:  1. K-Dur 20 mg p.o. b.i.d.  2. Atenolol/hydrochlorothiazide 100/25 mg p.o. daily.  3. Multivitamin.   DRUG SENSITIVITIES:  NONE KNOWN.   SOCIAL HISTORY:  The patient has been married for the last 7 years after  a 14 year relationship.  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   REVIEW OF SYSTEMS:  Negative for fever, headache, nausea, vomiting,  chest pain, abdominal pain, leg pain or swelling, abnormal vaginal  discharge, rectal bleeding, constipation or diarrhea.   PHYSICAL EXAMINATION:  GENERAL:  The patient is a well-appearing female  in no acute distress.  VITAL SIGNS:  Temperature 98.5, blood pressure 110/88, pulse 78, weight  136 pounds, height 5 feet 0 inches.  NECK:  Thyroid is normal without masses or tenderness.  LUNGS:  Clear.  HEART:  Regular rate and rhythm.  BACK:  No CVA tenderness.  ABDOMEN:  Soft without any organomegaly or tenderness.  EXTREMITIES:  No clubbing, cyanosis or edema.  The patient does have a  tattoo on her right lateral ankle.  PELVIC:  EGBUS is within normal limits.  The vagina shows a third-degree  cystocele and uterine prolapse.  The cervix is atrophic and no IUD  string is noted.  The uterus is involved with a mass that fills the  pelvis and is not separable from the adnexa.  The mass is relatively  immobile, but nontender.  The rectovaginal septum is filled  by this  pelvic mass, but is nontender.  There are no rectal luminal masses.   IMPRESSION:  1. Pelvic mass most likely of inflammatory etiology.  2. Retained intrauterine device for many years, question its role in      the inflammatory mass.  3. History of urinary tract infection, now resolved.  4. Right hydronephrosis and hydroureter thought to be secondary to an      inflammatory mass  5. Asymptomatic pelvic relaxation.  6. Chronic hypertension.   DISPOSITION:  Numerous conversations have been held with the patient and  her husband  concerning the recommended therapy.  She will be admitted to  [facility] for exploratory laparotomy, total abdominal  hysterectomy, bilateral salpingo-oophorectomy, removal of pelvic mass  and IUD.  This will be performed after a bowel preparation.  The risks  of anesthesia, bleeding, infection and damage to adjacent organs have  been explained  in detail.  The risk of the need for bowel surgery and possible  colostomy have also been discussed with the patient in detail.  She and  her husband acknowledge having had their questions answered and wished  to proceed.  This will be performed at [facility] on October 15, 2007 at 7:30.      Hal Morales, M.D.  Electronically Signed     VPH/MEDQ  D:  10/12/2007  T:  10/12/2007  Job:  [id number]   cc:   Tammy R. Collins Scotland, M.D.  Fax: [phone]   Leonie Man, M.D.  [address]  [address]  [place]  [place] [address]

## 2010-06-01 NOTE — Discharge Summary (Signed)
[name], SON            ACCOUNT NO.:  [id number]   MEDICAL RECORD NO.:  [id number]          PATIENT TYPE:  INP   LOCATION:  1527                         FACILITY:  [facility]   PHYSICIAN:  Ardeth Sportsman, MD     DATE OF BIRTH:  August 25, 1940   DATE OF ADMISSION:  09/15/2008  DATE OF DISCHARGE:  09/18/2008                               DISCHARGE SUMMARY   PRIMARY CARE PHYSICIAN:  Tammy R. Collins Scotland, M.D.   Elder CyphersMarland Kitchen  Maris Berger. Pennie Rushing, M.D.   SURGEON:  Ardeth Sportsman, M.D.   PRINCIPAL DIAGNOSIS:  Perforated diverticulitis status post colectomy  and ostomy, now status post colostomy takedown.   OTHER DIAGNOSES:  1. Pelvic abscess.  Status post abdominal hysterectomy bilateral      salpingo-oophorectomy by Dr. Pennie Rushing in October 2009.  2. Coronary artery disease status post myocardial infarction and      recent coronary artery bypass grafting.  3. Diverticulosis.  4. Hypertension.  5. Anemia.  6. Question of perioperative stroke versus transient ischemic attack.  7. Hypercholesterolemia.   HOME MEDICATIONS:  1. Metoprolol 50 mg daily.  2. Aspirin 325 mg daily.  3. Multivitamin with minerals daily.  4. Crestor 20 mg daily.  5. Iron 325 mg daily.   DISCHARGE MEDICATIONS:  1. Ice q.2 hours and heat q.2 hours.  2. Aleve 1 to 2 p.o. q.12 hours p.r.n. pain x1 week.  3. Tylenol p.r.n. pain.  4. Oxycodone 5-10 mg p.o. q.4 hours p.r.n. pain.  5. Phenergan 12.5 to 25 mg p.o. q.6 hours p.r.n. nausea.   SUMMARY OF HOSPITAL COURSE:  Ms. Dershem is a pleasant 70 year old  female who had severe multisystem organ failure secondary to a large  pelvic abscess, probably related tubo-ovarian abscess and IUD and also  primary versus secondary diverticulitis with perforation that required  emergency surgeries x2 in October 2009.  She had prolonged ICU stay and  hospital course and recovered.  She postoperatively was worked up for  cardiac clearance and was found to have significant  coronary artery  disease and underwent bypass grafting earlier this year, tolerated that.  She had improvement.  Therefore, I took her to the operating room and  did laparoscopic lysis adhesions and colostomy takedown.  She was  started on anti ileus protocol.  She was started on liquids.  by the  time of discharge, she was walking the hallways.  She had adequate pain  control on medications.  She was having regular bowel movements.  Her  p.o. pain control was good.  Based on these improvements, she will be  discharged home with the following instructions.   DISCHARGE INSTRUCTIONS:  1. She is to return to clinic with me in about 7 to 14 days.  2. She should shower daily.  Keep her incisions clean and dry.  3. She needs to have the wicks in her ostomy wound removed 3 days from      now on postoperative day #5.  4. She should keep dry gauze over the area while there is drainage,      keep the incisions clean and dry.  Neither  she nor her husband feel      comfortable with this, and therefore, we will set up home health to      do this for her.  5. She should walk at least an hour a day.  6. She should drink plenty of liquids and be on a fiber rich low      sodium diet daily.  7. She should call if she has any fevers, chills, sweats, worsening      pain, nausea or vomiting, or other concerns.      Ardeth Sportsman, MD  Electronically Signed     SCG/MEDQ  D:  09/17/2008  T:  09/17/2008  Job:  [id number]   cc:   Tammy R. Collins Scotland, M.D.  Fax: [phone]   Hal Morales, M.D.  Fax: [phone]

## 2010-06-01 NOTE — Assessment & Plan Note (Signed)
OFFICE VISIT   [name], [name]  DOB:  December 07, 1940                                        May 12, 2008  CHART #:  [id number]   HISTORY:  The patient is a 70 year old woman who was being evaluated for  a colostomy takedown.  She had severe three-vessel coronary disease.  She underwent coronary bypass grafting.  When she first arrived,, her  pulmonary artery pressures were markedly elevated.  We did TEE  intraoperatively and she had severe mitral regurgitation and a patent  foramen ovale.  The PFO was closed.  She also had a mitral ring in  addition to coronary bypass grafting x4.  Postoperatively, she did well  and was discharged home.  The blood pressure been much lower.  Postoperatively, she had been on metoprolol 100 mg b.i.d.  We send her  out on  Toprol-XL 25 mg daily.  She returns today.  She is having some  soreness.  She states she is taking half of an oxycodone tablet at  night, that is all the pain medication she is using.  She is complaining  of some soreness, particularly in her shoulders and also some irritation  around the sternal incision.  She has not had any drainage or redness.  She does not feel any clicking or popping.   PHYSICAL EXAMINATION:  GENERAL:  The patient is a 70 year old female in  no acute distress.  VITAL SIGNS:  Her blood pressure is 172/98, pulse is 101, respirations  were 18.  Her oxygen saturation is 95%.  CHEST:  Her sternum is stable.  Sternal incision is clean, dry, and  intact.  Chest tube sites were healing well.  CARDIAC:  A regular rate and rhythm.  There is no murmur, but there is  an S4.  LUNGS:  Clear with equal breath sounds.  EXTREMITIES:  Leg incisions are healing well.  She has trace peripheral  edema.   Her chest x-ray today shows tiny bilateral effusions.  There is no  infiltrates or pulmonary edema.   IMPRESSION:  The patient is a 70 year old woman.  She is status post  coronary bypass grafting,  mitral repair, and closure of a patent foramen  ovale.  She is doing extremely well at this point in time.  She is only  3 weeks out from surgery.  I told her that it is okay to use the pain  medication as she needed it.  I gave her a prescription for Vicodin  5/325 one tablet 3 times daily as needed for pain with 30 tablets and no  refills.  I did increase her Toprol to 50 mg daily and I suspect, she  will end up being on a much higher dose of beta-blocker in the future  but should probably approach it gradually, at least starting out.  She  has not seen Dr. [name] since surgery, and I have advised her that may  go ahead and make an appointment with him.  I did advise him not to have  a colostomy takedown for at least 6-8 weeks.  She is not to lift the abs  greater than 10 pounds for another 3 weeks.  She may begin driving.  Appropriate precautions were discussed.  She will continue to follow up  with Dr. Yehuda Budd and Dr. [name].  I will be  happy to see her back  anytime, if I can be of any further assistance with her care.   Salvatore Decent [name], M.D.  Electronically Signed   SCH/MEDQ  D:  05/12/2008  T:  05/13/2008  Job:  [id number]   cc:   Elmore Guise., M.D.  Tammy R. Collins Scotland, M.D.

## 2010-06-01 NOTE — Consult Note (Signed)
[name], OPIE NO.:  [id number]   MEDICAL RECORD NO.:  [id number]          PATIENT TYPE:  OUT   LOCATION:  GYN                          FACILITY:  [facility]   PHYSICIAN:  De Blanch, M.D.DATE OF BIRTH:  1940/06/17   DATE OF CONSULTATION:  10/03/2007  DATE OF DISCHARGE:                                 CONSULTATION   CHIEF COMPLAINT:  Complex pelvic mass.   HISTORY OF PRESENT ILLNESS:  A 70 year old white married female seen in  consultation at the request of Dr. Pennie Rushing regarding management of a  complex pelvic mass which appears to be inflammatory.   The patient gives me a history that for the past 5-6 months she has had  fatigue and weakness and has undergone an extensive evaluation, seen a  number of doctors and having a number of imaging studies of her pelvis.  She denies any fever or chills.  She says that approximately 5 months  ago she did have a urinary tract infection which was treated with  antibiotics.  Subsequently she has had two other rounds of antibiotics  for treating presumed tubo-ovarian abscesses.  In conversation with Dr.  Pennie Rushing it is my understanding she had a white count elevation to  14,000, although that is resolved but her sed rate remains elevated.  From a gynecologic point of view the patient has no particular symptoms.  She specifically denies any pelvic pain, pressure, GI or GU symptoms.  She has an IUD in place that has been present for 30 years.  She does  note at least an 8 pound weight loss although in recent weeks she has  been feeling better and feels like she has gained some of her weight  back.   The CT scan obtained on September 11 shows right hydronephrosis  secondary to a pelvic inflammatory process.  She also has sigmoid  diverticulosis with no obstruction.  She has an ill-defined soft tissue  gas-filled dense collection measuring 3.2 x 4.3 cm and medial to that  there is a fluid density structure which  may represent a right  hydrosalpinx or pyosalpinx.  She has gas and fluid collection in the  left hemi pelvis measuring 2.7 x 3.7 cm as well.  An IUD is seen and  there is air in the endometrial cavity or myometrium.  It is felt that  she has bilateral tubo-ovarian abscesses involving the uterus.   PAST MEDICAL HISTORY:  Medical illnesses - hypertension.   PAST SURGICAL HISTORY:  Dental extraction.   DRUG ALLERGIES:  None.   CURRENT MEDICATIONS:  None.   FAMILY HISTORY:  Mother had a melanoma.   SOCIAL HISTORY:  The patient is married.  She has previously divorced  and remarried.  She works as a Network engineer.  She has never  smoked and does not drink.   OBSTETRICAL HISTORY:  Gravida 3.   REVIEW OF SYSTEMS:  A 10 point comprehensive review of systems is  negative except as noted above.   PHYSICAL EXAMINATION:  VITAL SIGNS:  Weight 137 pounds, height 5 feet,  blood pressure 147/80, pulse 80.  GENERAL:  The patient is a healthy white female in no acute distress.  HEENT:  Negative.  NECK:  Supple without thyromegaly.  There is no supraclavicular or  inguinal adenopathy.  ABDOMEN:  The abdomen is remarkably soft and nontender.  No masses,  organomegaly, ascites or hernias are noted.  PELVIC:  EG/BUS, vagina, bladder and urethra are normal.  The cervix is  patulous and has some yellow purulent material coming from the os.  On  bimanual examination the uterus seemed slightly enlarged and there is a  right adnexal mass measuring approximately 5 cm.  There is thickening in  the cul-de-sac and extension along the left uterosacral ligament.  The  exact extent of the mass cannot exactly be detected but it seems to me  approximately 6 cm in aggregate.   IMPRESSION:  Complex pelvic mass with evidence of chronic inflammatory  process.  The patient is scheduled to undergo surgery on Monday.  I  think this is very reasonable.  I did not see any evidence of a  gynecologic  malignancy.  I would suggest the patient have a mechanical  bowel prep as there may be some involvement of the sigmoid colon and/or  appendix.  All the patient's questions were answered.  I will also  contact Dr. Pennie Rushing by phone and discussed the case with her.      De Blanch, M.D.  Electronically Signed     DC/MEDQ  D:  10/03/2007  T:  10/04/2007  Job:  [id number]   cc:   Hal Morales, M.D.  Fax: [phone]   Telford Nab, R.N.  [address]  [place], [place] [address]   Tammy R. Collins Scotland, M.D.  Fax: [phone]

## 2010-06-01 NOTE — Op Note (Signed)
NAMECAROLLYN, [name]            ACCOUNT NO.:  [id number]   MEDICAL RECORD NO.:  [id number]          PATIENT TYPE:  INP   LOCATION:  2304                         FACILITY:  [facility]   PHYSICIAN:  Salvatore Decent. [name], M.D.DATE OF BIRTH:  [birth year]   DATE OF PROCEDURE:  04/22/2008  DATE OF DISCHARGE:                               OPERATIVE REPORT   PREOPERATIVE DIAGNOSIS:  Three-vessel coronary artery disease.   POSTOPERATIVE DIAGNOSES:  Three-vessel coronary artery disease plus  severe mitral regurgitation plus patent foramen ovale.   PROCEDURE:  Median sternotomy, extracorporeal circulation, coronary  artery bypass grafting x4 (left internal mammary artery to left anterior  descending, saphenous vein graft to first diagonal, saphenous vein graft  to ramus intermedius, saphenous vein graft to distal right coronary),  endoscopic vein harvest of right leg, mitral valve repair with  annuloplasty with 30-mm Edwards Physio II annuloplasty ring (model  number [id number], serial number [id number]), closure of patent foramen ovale.   SURGEON:  Salvatore Decent. [name], MD   ASSISTANT:  Cameron Proud.   ANESTHESIA:  General.   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  Transesophageal echocardiography revealed 4+ MR even with  maneuvers to unload the heart.  Dilated annulus. Minimal residual MR  after repair.  LAD good-quality target, diagonal fair-quality target,  and distal right and ramus intermedius, poor quality targets, good-  quality mammary, and good-quality saphenous vein.   CLINICAL NOTE:  [name] is a 70 year old woman who had a non-Q-wave  MI during an acute illness in the fall.  She is now under evaluation for  colostomy takedown.  She needed cardiology clearance.  At  catheterization, she was found to have severe three-vessel coronary  artery disease.  There was 1 to 2+ MR on the left ventriculogram.  The  patient was advised to have  coronary artery bypass grafting prior to  ostomy takedown, decrease risk for perioperative MI.   I met with the patient and discussed the indications, risks, benefits,  and alternatives.  She understands that the risks of surgery include but  are not limited to death, stroke, MI, DVT, PE, bleeding, possible need  for transfusions, infection as well as other organ system dysfunction.  She understands that she is at increased risk for mediastinitis and  other infectious complications due to the presence of the ostomy.  She  understands and accepts risks and agrees to proceed.   OPERATIVE NOTE:  [name] was brought to the preop holding area on  April 22, 2008.  The Anesthesia Service under direction of Dr. Sharee Holster placed an arterial blood pressure monitoring catheter and a  Swan-Ganz catheter.  Intravenous antibiotics were administered.  The  patient was taken to the operating room, anesthetized and intubated.  The patient's pulmonary artery pressures were elevated at 70/40 even  after intubation and induction of general anesthesia.  Transesophageal  echocardiography was performed which revealed 4+ MR even with measures  to load the heart by decreasing the systemic pressure, although  resulting in a decrease in the pulmonary artery pressures, did not  result in any change  in the severe mitral regurgitation.  Decision was  made to proceed with repair of the mitral valve.  She was also noted to  have a patent foramen ovale.  The chest, abdomen, and legs were prepped  and draped in the usual sterile fashion.  An incision was made in the  medial aspect of the right leg at the level of the knee and the greater  saphenous vein was harvested endoscopically.  The saphenous vein was of  good quality.  Simultaneously, a median sternotomy was performed.  The  left internal mammary artery was harvested using standard technique.  A  2000 units of heparin was administered during the vessel  harvest.  The  remainder of the full heparin dose was given prior to opening the  pericardium.   The pericardium was opened.  The ascending aorta was inspected.  There  was no evidence of atherosclerotic disease.  The aorta was cannulated  via concentric 2-0 Ethilon pledgeted purse-string sutures after  confirming adequate anticoagulation with ACT measurement.  A purse-  string suture was placed in the superior vena cava and a 28-French metal  tip right-angle venous cannula was placed via this purse-string into the  superior vena cava.  Cardiopulmonary bypass was instituted.  ACTs were  monitored to maintain adequate anticoagulation.  Flows were maintained  per protocol.  A separate purse-string suture was placed in the inferior  aspect of the right atrium and a 36-French malleable cannula was placed  via this purse-string suture and directed into the inferior vena cava.  This was connected to the bypass circuit and tapes were placed around  each cava, but only tightened during the open portion of the procedure  for the right atrium.  The coronary arteries were inspected and  anastomotic sites were chosen.  The conduits were inspected and cut to  length.  A foam pad was placed in the pericardium to protect the left  phrenic nerve.  A temperature probe was placed in the myocardial septum.  A retrograde cardioplegia cannula was placed via purse-string suture in  the right atrium and directed in the coronary sinus.  An antegrade  cardioplegic cannula was placed in the ascending aorta.   The aorta was cross-clamped.  The left ventricle was emptied via the  aortic root vent.  Cardiac arrest was then achieved with a combination  of cold antegrade and retrograde blood cardioplegia and topical iced  saline.  An initial 500 mL of cardioplegia was administered antegrade.  There was rapid septal cooling and rapid diastolic arrest.  Additional  409 mL of cardioplegia was administered via the  retrograde cannula.  The  myocardial septal temperature fell to 9 degrees Celsius.  Additional  cardioplegia was administered at the completion of each vein graft at  every 20-minute intervals during the valve portion of the procedure.   The following distal anastomoses were performed.   First, a reversed saphenous vein graft was placed end-to-side to the  distal right coronary.  This vessel had a 90% mid stenosis.  It gave  rise to multiple small posterior descending posterolateral branches, all  of which individually were too small to graft to.  The right coronary  had posterior plaquing.  It only accepted a 1-mm probe.  This probe did  pass easily into the posterior descending branch.  Vein graft was  anastomosed end-to-side with a running 7-0 Prolene suture.  There was  good flow and good hemostasis with cardioplegia administration.   Next, a reverse  saphenous vein graft was placed end-to-side to the ramus  intermedius.  This was only a 1-mm vessel and was poor quality.  The  vein graft was anastomosed end-to-side with a running 7-0 Prolene suture  and the anastomosis was probed proximally and distally.  At completion,  there was adequate flow and good hemostasis.   Next, a reverse saphenous vein graft was placed end-to-side to the first  diagonal branch to the LAD.  This was diffusely proximal LAD segment as  well as the diagonal vessel that was a 1.5-mm fair-quality target.  The  anastomosis was performed with a running 7-0 Prolene suture.   Next, the left internal mammary artery was brought through a window in  the pericardium.  The distal end was beveled and it was anastomosed end-  to-side to the distal LAD.  This was a 1.5-mm good-quality target at the  site of anastomosis.  The mammary was a 1.5-mm good-quality conduit.  Anastomosis was performed with a running 8-0 Prolene suture.  At  completion of anastomosis, bulldog clamp was briefly removed to inspect  for hemostasis.   Immediate rapid septal rewarming was noted.  Bulldog  clamp was replaced.  The mammary pedicle was tacked to the epicardial  surface of the heart with 6-0 Prolene sutures.   Additional cardioplegia then was administered via the retrograde  cannula.  The interatrial groove was dissected out and the left atrium  was opened.  The retractor was placed exposing the mitral valve.  The  valve leaflets were inspected.  There were no prolapsing segments.  Cords were normal.  There was posterior annular dilatation.  The  anterior leaflet sized for a 30 mm Physio II ring.  The 2-0 Ethilon  horizontal mattress sutures were then placed circumferentially around  the annulus beginning at 7 o'clock position and proceeding both  erections from there.  After the sutures had been placed, the  commissural sutures were sized and likewise sized appropriately for a 30  mm ring.  The sutures then were placed through the sewing ring of the  annuloplasty ring.  The ring was lowered into place and the sutures were  sequentially tied.  Iced saline was instilled into the left ventricle.  There was a trace residual leak at the P2, P3 junction.  The rewarming  was begun.  The left atrium then was closed in 2 layers with a running 4-  0 Prolene suture, the first layer was a running horizontal mattress  suture followed by a running simple suture.  The atrium was de-aired  before tying the sutures.  The caval tapes then were tightened.  The  right atrium was opened.  Patent foramen ovale was identified and closed  with a single 4-0 Prolene pledgeted suture.  Left atriotomy was closed  with a running 4-0 Prolene suture.   The vein grafts were cut to length.  The cardioplegia cannulas were  removed from the ascending aorta and the proximal vein graft anastomoses  were performed to 4.0 mm punch aortotomies with running 6-0 Prolene  sutures.  At the completion of the final proximal anastomosis, the  patient was placed in  Trendelenburg position.  Lidocaine was  administered.  A warm dose of retrograde cardioplegia was administered.  The bulldog clamps were then removed from the left mammary artery.  The  aortic root was de-aired and the aortic crossclamp was removed.  Total  crossclamp time was 129 minutes.   The patient was in complete heart block and  did not require  defibrillation.  Epicardial pacing wires were placed on the right  ventricle and right atrium.  All proximal and distal anastomoses as well  as the atriotomies were inspected for hemostasis.  A dopamine infusion  was initiated at 3 mcg/kg/minute.  When the patient rewarmed to a core  temperature of 37 degrees Celsius, she was weaned from cardiopulmonary  bypass on the first attempt without difficulty.  Post-bypass  transesophageal echocardiography revealed continued inferolateral left  ventricular dysfunction.  There was septal dyskinesis with pacing.  Overall ejection fraction was approximately 40%.  There was trace to 1+  residual mitral regurgitation, much improved from the prebypass  appearance.  There was no residual shunting across the foramen ovale.   A test dose of protamine was administered.  The atrial and caval and  aortic cannulas were removed.  The remainder of the protamine was  administered without incident.  It should be noted that additional de-  airing of the left atrial and left ventricle was performed prior to  coming off bypass and there was no residual air noted on echo after  coming off bypass.  Hemostasis was achieved.  The chest was irrigated  with warm saline.  The pericardium was reapproximated with interrupted 3-  0 silk sutures.  It came together easily without tension.  The left  pleural and mediastinal chest tubes were placed through separate  subcostal incisions.  The sternum was closed with interrupted single and  double  heavy gauge stainless steel wires.  Pectoralis fascia, subcutaneous  tissue, and skin  were closed in a standard fashion.  All sponge, needle,  and instrument counts were correct at the end of the procedure.  The  patient was transported from the operating room to the Surgical  Intensive Care Unit in fair condition.      Salvatore Decent [name], M.D.  Electronically Signed     SCH/MEDQ  D:  04/22/2008  T:  04/23/2008  Job:  [id number]   cc:   Elmore Guise., M.D.  Ardeth Sportsman, MD  Nicoletta Dress Colon Branch, M.D.

## 2010-06-01 NOTE — Consult Note (Signed)
NAMEJOELLY, BOLANOS            ACCOUNT NO.:  [id number]   MEDICAL RECORD NO.:  [id number]          PATIENT TYPE:  INP   LOCATION:  2913                         FACILITY:  [facility]   PHYSICIAN:  Casimiro Needle L. Reynolds, M.D.DATE OF BIRTH:  Apr 15, 1940   DATE OF CONSULTATION:  11/09/2007  DATE OF DISCHARGE:                                 CONSULTATION   HISTORY OF PRESENT ILLNESS:  This is a 70 year old white female who on  October 15, 2007, underwent an exploratory laparotomy, lysis of  adhesions, total abdominal hysterectomy, bilateral salpingo-  oophorectomy, and appendectomy.  On October 16, 2007, patient became  hypoxic, hypotensive, and was found to be in sinus tachycardia.  At that  time, differential diagnosis was pulmonary embolism versus vasodilatory  shock with evidence of subendocardial global ischemia and ST changes on  her EKG.  Patient at that time was placed on anticoagulants.  In the  interim, patient had significantly improved, was alert, oriented, and  talking to staff, asking for ice chips, and following commands.  Patient  took a turn for the worse on October 20, 2007, was placed on ARDS  protocol, intubated.  Patient became febrile.  A CT scan of abdomen  shows significant free air in the sigmoid colon needing surgical  intervention on October 22, 2007.  Patient continued to improve  postoperatively, was alert, oriented, communicative with nursing staff.  However, on November 08, 2007, patient was found nonresponsive in  cardiopulmonary arrest.  CPR was administered.  Patient was shocked and  given cardiopulmonary medications.  Sinus rhythm was recovered.  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  Unfortunately, the last known  CT scan was on October 26, 2007, and between the interim of October 26, 2007, and November 08, 2007, it is unknown when patient suffered from  this ischemic infarct.   PAST MEDICAL HISTORY:  Includes:  1. Hypertension diagnosed in 2009.  2. Gastroesophageal reflux disease.  3. Patient had episode of Bell palsy in June of 2008.  4. She also has a urinary tract infection in August of 2009.   ALLERGIES:  NO KNOWN DRUG ALLERGIES.   SOCIAL HISTORY:  Patient has been married for 7 years after 14-year  relationship.  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   REVIEW OF SYSTEMS:  Was unobtainable secondary to patient being  intubated.   PHYSICAL EXAM:  Blood pressure is 160/91.  Pulse 110.  Respiratory 24.  MENTAL STATUS:  She is intubated at this time; however, she is alert and  aware of what is going on around her as she can follow some commands.  Patient was able to stick her tongue out, look right to left, smile, and  at times tried to voice answers.  CRANIAL NERVES:  Pupils equal, round, and reactive to light and  accommodating.  Conjugate gaze.  Extraocular muscles are intact.  Visual  fields are intact as far as blinks to threat bilaterally.  Face is  symmetrical at this time.  Tongue is midline when stuck out.  MOTOR:  Patient moves bilateral lower extremities spontaneously, 4/5  strength.  She does withdraw from pain bilaterally with upper and lower  extremities.  Bilateral upper extremity at this time persistently is in  a flexion decorticate state, however, patient is able to extend arm  spontaneously.  When arms are passively extended, she has increased  tone.  No tremor.  No asterixis.  Patient's bilateral lower extremities  have persistent tone as when knee is placed in a flexion state it  remains in that state and does not fall.  At times, patient will pull  bilateral upper extremities showing 5/5 strength with biceps flexion.  Patient did show right and left grip with the right grip being stronger   than left grip, both being moderately weak.  Patient did also show a  positive Hoffmann sign with the right being greater than left.  As far  as reflexes, she is 2+ in reflexes globally with bilateral downgoing  toes.  PULMONARY:  She has bilateral upper and lower lobe rhonchi.  CARDIOVASCULAR:  S1 and S2.  Regular rate and rhythm.  No bruits in her  neck.  NECK:  Supple at this time.  SENSATION:  Intact as she does pull from pain in all 4 quadrants and  winces to pain with pinprick.   LABS:  Sodium was 139, potassium 4.1, chloride 115, bicarb 17.  Renal  function with BUN of 86 and creatinine of 3.63 with glucose 222.  White  blood cell was elevated at 13.8, platelets at 204, hemoglobin/hematocrit  are 11.4 and 35.3, BNP is 2544.   IMAGING AND TESTS:  Patient did have an EEG that was finished on November 07, 2007, which showed excessive low-voltage activity and nonreactive  monotonous background of very low theta consistent with coma.  CT of  head that was taken on November 08, 2007, showed new subacute right  cerebellar infarct in the right distribution at the PICA.  A 2D echo  that was finished on September 18, 2007, showed inferoseptal,  inferolateral hypokinesis of the left ventricle and left ventricular  ejection fraction of 50.   IMPRESSION:  At this time, status post cerebrovascular accident and  cardiopulmonary arrest.  1. 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  2. Status post cardiopulmonary arrest.  Patient was previously alert,      following commands, cannot rule out mild anoxic brain injury.   RECOMMENDATIONS:  1. A 2D echo to view any changes that have occurred since September 18, 2007.  2. Carotid Dopplers to look for vertebral flow.  3. MRA/MRI to view any other acute or subacute infarction along with      white matter changes for the possibility of anticoagulation.  4.  Continue aspirin for now and subcu heparin.  5. Get a fasting profile, homocysteine, HbA1c.  6. Good medical support.   Thank you very much for this consult.     ______________________________  Felicie Morn, PA-C      Marolyn Hammock. Thad Ranger, M.D.  Electronically Signed    DS/MEDQ  D:  11/09/2007  T:  11/09/2007  Job:  [id number]

## 2010-08-10 ENCOUNTER — Other Ambulatory Visit: Payer: Self-pay | Admitting: Cardiology

## 2010-08-10 NOTE — Telephone Encounter (Signed)
escribe medication per fax request  

## 2010-10-18 LAB — CROSSMATCH
ABO/RH(D): O POS
Antibody Screen: NEGATIVE
Antibody Screen: NEGATIVE

## 2010-10-18 LAB — URINE CULTURE
Colony Count: NO GROWTH
Colony Count: NO GROWTH
Culture: NO GROWTH
Culture: NO GROWTH
Special Requests: NEGATIVE

## 2010-10-18 LAB — BASIC METABOLIC PANEL
BUN: 10
BUN: 7
BUN: 10
BUN: 7
CO2: 23
CO2: 26
Calcium: 7.1 — ABNORMAL LOW
Calcium: 7.8 — ABNORMAL LOW
Chloride: 97
Creatinine, Ser: 0.67
GFR calc Af Amer: >60
GFR calc non Af Amer: >60
GFR calc non Af Amer: >60
GFR calc non Af Amer: >60
Glucose, Bld: 193 — ABNORMAL HIGH
Glucose, Bld: 194 — ABNORMAL HIGH
Potassium: 2.7 — CL
Potassium: 3 — ABNORMAL LOW
Potassium: 3.2 — ABNORMAL LOW
Potassium: 3.3 — ABNORMAL LOW
Sodium: 134 — ABNORMAL LOW
Sodium: 135
Sodium: 132 — ABNORMAL LOW

## 2010-10-18 LAB — CARBOXYHEMOGLOBIN
Carboxyhemoglobin: 1
Carboxyhemoglobin: 1.1
Carboxyhemoglobin: 1.2
Carboxyhemoglobin: 1.3
Carboxyhemoglobin: 1.3
Methemoglobin: 0.6
Methemoglobin: 0.8
Methemoglobin: 1.1
O2 Saturation: 48.4
O2 Saturation: 50.8
O2 Saturation: 54
O2 Saturation: 64.3
O2 Saturation: 79.3
Total hemoglobin: 14
Total hemoglobin: 8.5 — ABNORMAL LOW
Total hemoglobin: 8.7 — ABNORMAL LOW

## 2010-10-18 LAB — HEPATIC FUNCTION PANEL
Bilirubin, Direct: 0.3
Indirect Bilirubin: 1.2 — ABNORMAL HIGH
Total Bilirubin: 1.5 — ABNORMAL HIGH

## 2010-10-18 LAB — BLOOD GAS, ARTERIAL
Acid-Base Excess: 1.1
Bicarbonate: 21.8
Drawn by: 153
Patient temperature: 98.6
RATE: 15
TCO2: 22.8
pCO2 arterial: 35.4
pH, Arterial: 7.479 — ABNORMAL HIGH
pO2, Arterial: 88.1

## 2010-10-18 LAB — CBC
HCT: 27.5 — ABNORMAL LOW
HCT: 29.1 — ABNORMAL LOW
HCT: 26.4 — ABNORMAL LOW
HCT: 30.5 — ABNORMAL LOW
Hemoglobin: 9.1 — ABNORMAL LOW
Hemoglobin: 10.1 — ABNORMAL LOW
Hemoglobin: 8.8 — ABNORMAL LOW
MCHC: 34
MCHC: 33.2
MCV: 83.6
MCV: 84.5
Platelets: 284
Platelets: 243
Platelets: 276
Platelets: 287
RBC: 3.17 — ABNORMAL LOW
RBC: 3.3 — ABNORMAL LOW
RDW: 16.8 — ABNORMAL HIGH
RDW: 16.8 — ABNORMAL HIGH
RDW: 16.9 — ABNORMAL HIGH
WBC: 11.8 — ABNORMAL HIGH
WBC: 14.8 — ABNORMAL HIGH
WBC: 9.7

## 2010-10-18 LAB — SAMPLE TO BLOOD BANK

## 2010-10-18 LAB — ABO/RH
ABO/RH(D): O POS
ABO/RH(D): O POS

## 2010-10-18 LAB — WOUND CULTURE

## 2010-10-18 LAB — DIFFERENTIAL
Basophils Absolute: 0
Basophils Relative: 0
Eosinophils Absolute: 0
Eosinophils Absolute: 0
Eosinophils Relative: 0
Eosinophils Relative: 2
Lymphocytes Relative: 16
Lymphocytes Relative: 6 — ABNORMAL LOW
Lymphs Abs: 0.8
Monocytes Absolute: 0.5
Monocytes Absolute: 0.7
Monocytes Absolute: 0.7
Monocytes Relative: 5
Monocytes Relative: 5
Monocytes Relative: 7

## 2010-10-18 LAB — COMPREHENSIVE METABOLIC PANEL
AST: 22
Albumin: 3.1 — ABNORMAL LOW
Alkaline Phosphatase: 111
Chloride: 103
Creatinine, Ser: 0.51
GFR calc Af Amer: >60
Potassium: 3.4 — ABNORMAL LOW
Total Bilirubin: 0.4

## 2010-10-18 LAB — CK TOTAL AND CKMB (NOT AT ARMC)
Relative Index: 13 — ABNORMAL HIGH
Total CK: 1344 — ABNORMAL HIGH

## 2010-10-18 LAB — MAGNESIUM: Magnesium: 1.1 — ABNORMAL LOW

## 2010-10-18 LAB — LIPASE, BLOOD: Lipase: 14

## 2010-10-18 LAB — GLUCOSE, CAPILLARY
Glucose-Capillary: 140 — ABNORMAL HIGH
Glucose-Capillary: 185 — ABNORMAL HIGH

## 2010-10-18 LAB — CK ISOENZYMES
CK-MB: 5 % — ABNORMAL HIGH (ref <5)
CK-MM: 95 % (ref 95–100)
Creatine Kinase-Total: 1351 U/L — ABNORMAL HIGH (ref 29–143)

## 2010-10-18 LAB — CULTURE, BLOOD (ROUTINE X 2)

## 2010-10-18 LAB — ANAEROBIC CULTURE

## 2010-10-18 LAB — CARDIAC PANEL(CRET KIN+CKTOT+MB+TROPI)
CK, MB: 161.4 — ABNORMAL HIGH
CK, MB: 175 — ABNORMAL HIGH
Relative Index: 10.5 — ABNORMAL HIGH
Relative Index: 11.1 — ABNORMAL HIGH
Troponin I: 35.93

## 2010-10-19 LAB — BASIC METABOLIC PANEL
BUN: 10
BUN: 13
BUN: 14
BUN: 14
BUN: 16
BUN: 18
BUN: 19
BUN: 21
BUN: 21
BUN: 22
BUN: 24 — ABNORMAL HIGH
BUN: 5 — ABNORMAL LOW
BUN: 7
BUN: 7
BUN: 15 mg/dL (ref 6–23)
CO2: 20
CO2: 21
CO2: 21
CO2: 22
CO2: 22
CO2: 22
CO2: 22
CO2: 24
CO2: 24
CO2: 24
CO2: 24
CO2: 24
CO2: 25
CO2: 25
CO2: 27
CO2: 22 mEq/L (ref 19–32)
CO2: 24 mEq/L (ref 19–32)
Calcium: 6.9 — ABNORMAL LOW
Calcium: 7.6 — ABNORMAL LOW
Calcium: 7.6 — ABNORMAL LOW
Calcium: 7.6 — ABNORMAL LOW
Calcium: 7.7 — ABNORMAL LOW
Calcium: 7.8 — ABNORMAL LOW
Calcium: 7.8 — ABNORMAL LOW
Calcium: 8 — ABNORMAL LOW
Calcium: 8 — ABNORMAL LOW
Calcium: 8.1 — ABNORMAL LOW
Calcium: 8.3 — ABNORMAL LOW
Calcium: 8.3 — ABNORMAL LOW
Calcium: 8.5
Calcium: 8.8
Calcium: 8.5 mg/dL (ref 8.4–10.5)
Chloride: 105
Chloride: 105
Chloride: 105
Chloride: 106
Chloride: 108
Chloride: 108
Chloride: 109
Chloride: 110
Chloride: 115 — ABNORMAL HIGH
Chloride: 115 — ABNORMAL HIGH
Chloride: 116 — ABNORMAL HIGH
Chloride: 110 mEq/L (ref 96–112)
Chloride: 105 mEq/L (ref 96–112)
Creatinine, Ser: 0.42
Creatinine, Ser: 0.53
Creatinine, Ser: 0.59
Creatinine, Ser: 0.61
Creatinine, Ser: 0.67
Creatinine, Ser: 0.73
Creatinine, Ser: 0.75
Creatinine, Ser: 0.76
Creatinine, Ser: 0.76
Creatinine, Ser: 0.84
Creatinine, Ser: 0.85
Creatinine, Ser: 0.88
Creatinine, Ser: 0.93
Creatinine, Ser: 0.94
Creatinine, Ser: 0.46 mg/dL (ref 0.4–1.2)
GFR calc Af Amer: >60
GFR calc Af Amer: >60
GFR calc Af Amer: >60
GFR calc Af Amer: >60
GFR calc Af Amer: >60
GFR calc Af Amer: >60
GFR calc Af Amer: >60
GFR calc Af Amer: >60
GFR calc Af Amer: >60
GFR calc Af Amer: >60
GFR calc Af Amer: >60
GFR calc Af Amer: >60 mL/min (ref >60)
GFR calc non Af Amer: 60 — ABNORMAL LOW
GFR calc non Af Amer: >60
GFR calc non Af Amer: >60
GFR calc non Af Amer: >60
GFR calc non Af Amer: >60
GFR calc non Af Amer: >60
GFR calc non Af Amer: >60
GFR calc non Af Amer: >60
GFR calc non Af Amer: >60
GFR calc non Af Amer: >60
GFR calc non Af Amer: >60
GFR calc non Af Amer: >60
GFR calc non Af Amer: >60
Glucose, Bld: 116 — ABNORMAL HIGH
Glucose, Bld: 119 — ABNORMAL HIGH
Glucose, Bld: 131 — ABNORMAL HIGH
Glucose, Bld: 139 — ABNORMAL HIGH
Glucose, Bld: 139 — ABNORMAL HIGH
Glucose, Bld: 143 — ABNORMAL HIGH
Glucose, Bld: 148 — ABNORMAL HIGH
Glucose, Bld: 150 — ABNORMAL HIGH
Glucose, Bld: 156 — ABNORMAL HIGH
Glucose, Bld: 163 — ABNORMAL HIGH
Glucose, Bld: 165 — ABNORMAL HIGH
Glucose, Bld: 232 — ABNORMAL HIGH
Glucose, Bld: 91
Glucose, Bld: 95
Glucose, Bld: 161 mg/dL — ABNORMAL HIGH (ref 70–99)
Potassium: 2.6 — CL
Potassium: 2.7 — CL
Potassium: 3 — ABNORMAL LOW
Potassium: 3 — ABNORMAL LOW
Potassium: 3.1 — ABNORMAL LOW
Potassium: 3.1 — ABNORMAL LOW
Potassium: 3.4 — ABNORMAL LOW
Potassium: 3.4 — ABNORMAL LOW
Potassium: 3.5
Potassium: 3.5
Potassium: 3.6
Potassium: 3.7
Potassium: 5
Potassium: 3.5 mEq/L (ref 3.5–5.1)
Sodium: 133 — ABNORMAL LOW
Sodium: 133 — ABNORMAL LOW
Sodium: 137
Sodium: 137
Sodium: 138
Sodium: 138
Sodium: 139
Sodium: 141
Sodium: 142
Sodium: 142
Sodium: 143
Sodium: 143
Sodium: 144
Sodium: 138 mEq/L (ref 135–145)

## 2010-10-19 LAB — CBC
HCT: 23.1 — ABNORMAL LOW
HCT: 26.3 — ABNORMAL LOW
HCT: 26.3 — ABNORMAL LOW
HCT: 26.6 — ABNORMAL LOW
HCT: 26.6 — ABNORMAL LOW
HCT: 27.1 — ABNORMAL LOW
HCT: 27.9 — ABNORMAL LOW
HCT: 28.3 — ABNORMAL LOW
HCT: 28.6 — ABNORMAL LOW
HCT: 28.8 — ABNORMAL LOW
HCT: 28.9 — ABNORMAL LOW
HCT: 29 — ABNORMAL LOW
HCT: 30.3 — ABNORMAL LOW
HCT: 31 — ABNORMAL LOW
HCT: 31.2 — ABNORMAL LOW
HCT: 32.1 — ABNORMAL LOW
HCT: 27.4 % — ABNORMAL LOW (ref 36.0–46.0)
Hemoglobin: 10.2 — ABNORMAL LOW
Hemoglobin: 10.2 — ABNORMAL LOW
Hemoglobin: 10.5 — ABNORMAL LOW
Hemoglobin: 8.8 — ABNORMAL LOW
Hemoglobin: 8.8 — ABNORMAL LOW
Hemoglobin: 8.8 — ABNORMAL LOW
Hemoglobin: 8.9 — ABNORMAL LOW
Hemoglobin: 8.9 — ABNORMAL LOW
Hemoglobin: 8.9 — ABNORMAL LOW
Hemoglobin: 9.2 — ABNORMAL LOW
Hemoglobin: 9.5 — ABNORMAL LOW
Hemoglobin: 9.6 — ABNORMAL LOW
Hemoglobin: 9.7 — ABNORMAL LOW
Hemoglobin: 9.7 — ABNORMAL LOW
Hemoglobin: 9.7 — ABNORMAL LOW
Hemoglobin: 9.3 g/dL — ABNORMAL LOW (ref 12.0–15.0)
MCHC: 32.2
MCHC: 32.3
MCHC: 32.7
MCHC: 32.8
MCHC: 32.9
MCHC: 32.9
MCHC: 33
MCHC: 33
MCHC: 33.1
MCHC: 33.2
MCHC: 33.4
MCHC: 33.5
MCHC: 33.6
MCHC: 33.6
MCHC: 33.6
MCHC: 33.6
MCHC: 33.3 g/dL (ref 30.0–36.0)
MCHC: 34 g/dL (ref 30.0–36.0)
MCV: 86.2
MCV: 86.3
MCV: 86.6
MCV: 86.6
MCV: 86.7
MCV: 86.8
MCV: 86.9
MCV: 87.2
MCV: 87.5
MCV: 87.6
MCV: 87.7
MCV: 87.9
MCV: 87.3 fL (ref 78.0–100.0)
MCV: 87.5 fL (ref 78.0–100.0)
MCV: 88.3 fL (ref 78.0–100.0)
Platelets: 206
Platelets: 233
Platelets: 292
Platelets: 311
Platelets: 332
Platelets: 360
Platelets: 375
Platelets: 385
Platelets: 396
Platelets: 423 — ABNORMAL HIGH
Platelets: 428 — ABNORMAL HIGH
Platelets: 444 — ABNORMAL HIGH
Platelets: 467 — ABNORMAL HIGH
Platelets: 570 — ABNORMAL HIGH
Platelets: 454 10*3/uL — ABNORMAL HIGH (ref 150–400)
RBC: 2.64 — ABNORMAL LOW
RBC: 2.93 — ABNORMAL LOW
RBC: 3.06 — ABNORMAL LOW
RBC: 3.07 — ABNORMAL LOW
RBC: 3.1 — ABNORMAL LOW
RBC: 3.11 — ABNORMAL LOW
RBC: 3.17 — ABNORMAL LOW
RBC: 3.19 — ABNORMAL LOW
RBC: 3.21 — ABNORMAL LOW
RBC: 3.28 — ABNORMAL LOW
RBC: 3.39 — ABNORMAL LOW
RBC: 3.66 — ABNORMAL LOW
RBC: 3.72 — ABNORMAL LOW
RBC: 2.98 MIL/uL — ABNORMAL LOW (ref 3.87–5.11)
RBC: 3.13 MIL/uL — ABNORMAL LOW (ref 3.87–5.11)
RDW: 16 — ABNORMAL HIGH
RDW: 16.2 — ABNORMAL HIGH
RDW: 16.3 — ABNORMAL HIGH
RDW: 16.4 — ABNORMAL HIGH
RDW: 16.5 — ABNORMAL HIGH
RDW: 16.6 — ABNORMAL HIGH
RDW: 16.6 — ABNORMAL HIGH
RDW: 16.9 — ABNORMAL HIGH
RDW: 17 — ABNORMAL HIGH
RDW: 17 — ABNORMAL HIGH
RDW: 17 — ABNORMAL HIGH
RDW: 17.1 — ABNORMAL HIGH
RDW: 17.5 — ABNORMAL HIGH
RDW: 17.7 — ABNORMAL HIGH
RDW: 17.7 — ABNORMAL HIGH
RDW: 17.4 % — ABNORMAL HIGH (ref 11.5–15.5)
WBC: 10.1
WBC: 10.5
WBC: 10.5
WBC: 10.6 — ABNORMAL HIGH
WBC: 12.1 — ABNORMAL HIGH
WBC: 22.5 — ABNORMAL HIGH
WBC: 4.2
WBC: 6.5
WBC: 6.8
WBC: 6.9
WBC: 8
WBC: 8.3
WBC: 8.5
WBC: 8.6
WBC: 8.9
WBC: 9.1
WBC: 9.5
WBC: 9.7
WBC: 6.2 10*3/uL (ref 4.0–10.5)
WBC: 8 10*3/uL (ref 4.0–10.5)

## 2010-10-19 LAB — GLUCOSE, CAPILLARY
Glucose-Capillary: 100 — ABNORMAL HIGH
Glucose-Capillary: 100 — ABNORMAL HIGH
Glucose-Capillary: 102 — ABNORMAL HIGH
Glucose-Capillary: 103 — ABNORMAL HIGH
Glucose-Capillary: 104 — ABNORMAL HIGH
Glucose-Capillary: 104 — ABNORMAL HIGH
Glucose-Capillary: 106 — ABNORMAL HIGH
Glucose-Capillary: 107 — ABNORMAL HIGH
Glucose-Capillary: 108 — ABNORMAL HIGH
Glucose-Capillary: 109 — ABNORMAL HIGH
Glucose-Capillary: 110 — ABNORMAL HIGH
Glucose-Capillary: 111 — ABNORMAL HIGH
Glucose-Capillary: 113 — ABNORMAL HIGH
Glucose-Capillary: 115 — ABNORMAL HIGH
Glucose-Capillary: 115 — ABNORMAL HIGH
Glucose-Capillary: 116 — ABNORMAL HIGH
Glucose-Capillary: 116 — ABNORMAL HIGH
Glucose-Capillary: 117 — ABNORMAL HIGH
Glucose-Capillary: 117 — ABNORMAL HIGH
Glucose-Capillary: 117 — ABNORMAL HIGH
Glucose-Capillary: 117 — ABNORMAL HIGH
Glucose-Capillary: 118 — ABNORMAL HIGH
Glucose-Capillary: 119 — ABNORMAL HIGH
Glucose-Capillary: 119 — ABNORMAL HIGH
Glucose-Capillary: 119 — ABNORMAL HIGH
Glucose-Capillary: 119 — ABNORMAL HIGH
Glucose-Capillary: 121 — ABNORMAL HIGH
Glucose-Capillary: 121 — ABNORMAL HIGH
Glucose-Capillary: 121 — ABNORMAL HIGH
Glucose-Capillary: 121 — ABNORMAL HIGH
Glucose-Capillary: 122 — ABNORMAL HIGH
Glucose-Capillary: 122 — ABNORMAL HIGH
Glucose-Capillary: 122 — ABNORMAL HIGH
Glucose-Capillary: 122 — ABNORMAL HIGH
Glucose-Capillary: 123 — ABNORMAL HIGH
Glucose-Capillary: 124 — ABNORMAL HIGH
Glucose-Capillary: 124 — ABNORMAL HIGH
Glucose-Capillary: 125 — ABNORMAL HIGH
Glucose-Capillary: 125 — ABNORMAL HIGH
Glucose-Capillary: 125 — ABNORMAL HIGH
Glucose-Capillary: 126 — ABNORMAL HIGH
Glucose-Capillary: 126 — ABNORMAL HIGH
Glucose-Capillary: 126 — ABNORMAL HIGH
Glucose-Capillary: 126 — ABNORMAL HIGH
Glucose-Capillary: 127 — ABNORMAL HIGH
Glucose-Capillary: 127 — ABNORMAL HIGH
Glucose-Capillary: 127 — ABNORMAL HIGH
Glucose-Capillary: 127 — ABNORMAL HIGH
Glucose-Capillary: 128 — ABNORMAL HIGH
Glucose-Capillary: 130 — ABNORMAL HIGH
Glucose-Capillary: 130 — ABNORMAL HIGH
Glucose-Capillary: 130 — ABNORMAL HIGH
Glucose-Capillary: 130 — ABNORMAL HIGH
Glucose-Capillary: 132 — ABNORMAL HIGH
Glucose-Capillary: 132 — ABNORMAL HIGH
Glucose-Capillary: 133 — ABNORMAL HIGH
Glucose-Capillary: 133 — ABNORMAL HIGH
Glucose-Capillary: 133 — ABNORMAL HIGH
Glucose-Capillary: 133 — ABNORMAL HIGH
Glucose-Capillary: 133 — ABNORMAL HIGH
Glucose-Capillary: 134 — ABNORMAL HIGH
Glucose-Capillary: 134 — ABNORMAL HIGH
Glucose-Capillary: 134 — ABNORMAL HIGH
Glucose-Capillary: 134 — ABNORMAL HIGH
Glucose-Capillary: 134 — ABNORMAL HIGH
Glucose-Capillary: 134 — ABNORMAL HIGH
Glucose-Capillary: 135 — ABNORMAL HIGH
Glucose-Capillary: 135 — ABNORMAL HIGH
Glucose-Capillary: 136 — ABNORMAL HIGH
Glucose-Capillary: 137 — ABNORMAL HIGH
Glucose-Capillary: 137 — ABNORMAL HIGH
Glucose-Capillary: 137 — ABNORMAL HIGH
Glucose-Capillary: 137 — ABNORMAL HIGH
Glucose-Capillary: 137 — ABNORMAL HIGH
Glucose-Capillary: 138 — ABNORMAL HIGH
Glucose-Capillary: 138 — ABNORMAL HIGH
Glucose-Capillary: 138 — ABNORMAL HIGH
Glucose-Capillary: 139 — ABNORMAL HIGH
Glucose-Capillary: 139 — ABNORMAL HIGH
Glucose-Capillary: 140 — ABNORMAL HIGH
Glucose-Capillary: 140 — ABNORMAL HIGH
Glucose-Capillary: 140 — ABNORMAL HIGH
Glucose-Capillary: 140 — ABNORMAL HIGH
Glucose-Capillary: 141 — ABNORMAL HIGH
Glucose-Capillary: 141 — ABNORMAL HIGH
Glucose-Capillary: 141 — ABNORMAL HIGH
Glucose-Capillary: 142 — ABNORMAL HIGH
Glucose-Capillary: 143 — ABNORMAL HIGH
Glucose-Capillary: 143 — ABNORMAL HIGH
Glucose-Capillary: 144 — ABNORMAL HIGH
Glucose-Capillary: 144 — ABNORMAL HIGH
Glucose-Capillary: 144 — ABNORMAL HIGH
Glucose-Capillary: 144 — ABNORMAL HIGH
Glucose-Capillary: 144 — ABNORMAL HIGH
Glucose-Capillary: 145 — ABNORMAL HIGH
Glucose-Capillary: 146 — ABNORMAL HIGH
Glucose-Capillary: 147 — ABNORMAL HIGH
Glucose-Capillary: 147 — ABNORMAL HIGH
Glucose-Capillary: 150 — ABNORMAL HIGH
Glucose-Capillary: 151 — ABNORMAL HIGH
Glucose-Capillary: 151 — ABNORMAL HIGH
Glucose-Capillary: 152 — ABNORMAL HIGH
Glucose-Capillary: 152 — ABNORMAL HIGH
Glucose-Capillary: 153 — ABNORMAL HIGH
Glucose-Capillary: 154 — ABNORMAL HIGH
Glucose-Capillary: 154 — ABNORMAL HIGH
Glucose-Capillary: 155 — ABNORMAL HIGH
Glucose-Capillary: 156 — ABNORMAL HIGH
Glucose-Capillary: 162 — ABNORMAL HIGH
Glucose-Capillary: 162 — ABNORMAL HIGH
Glucose-Capillary: 164 — ABNORMAL HIGH
Glucose-Capillary: 165 — ABNORMAL HIGH
Glucose-Capillary: 168 — ABNORMAL HIGH
Glucose-Capillary: 169 — ABNORMAL HIGH
Glucose-Capillary: 169 — ABNORMAL HIGH
Glucose-Capillary: 171 — ABNORMAL HIGH
Glucose-Capillary: 171 — ABNORMAL HIGH
Glucose-Capillary: 171 — ABNORMAL HIGH
Glucose-Capillary: 173 — ABNORMAL HIGH
Glucose-Capillary: 173 — ABNORMAL HIGH
Glucose-Capillary: 177 — ABNORMAL HIGH
Glucose-Capillary: 178 — ABNORMAL HIGH
Glucose-Capillary: 179 — ABNORMAL HIGH
Glucose-Capillary: 183 — ABNORMAL HIGH
Glucose-Capillary: 186 — ABNORMAL HIGH
Glucose-Capillary: 188 — ABNORMAL HIGH
Glucose-Capillary: 188 — ABNORMAL HIGH
Glucose-Capillary: 194 — ABNORMAL HIGH
Glucose-Capillary: 197 — ABNORMAL HIGH
Glucose-Capillary: 199 — ABNORMAL HIGH
Glucose-Capillary: 202 — ABNORMAL HIGH
Glucose-Capillary: 204 — ABNORMAL HIGH
Glucose-Capillary: 205 — ABNORMAL HIGH
Glucose-Capillary: 206 — ABNORMAL HIGH
Glucose-Capillary: 212 — ABNORMAL HIGH
Glucose-Capillary: 218 — ABNORMAL HIGH
Glucose-Capillary: 220 — ABNORMAL HIGH
Glucose-Capillary: 238 — ABNORMAL HIGH
Glucose-Capillary: 259 — ABNORMAL HIGH
Glucose-Capillary: 82
Glucose-Capillary: 91
Glucose-Capillary: 93
Glucose-Capillary: 96
Glucose-Capillary: 96
Glucose-Capillary: 99
Glucose-Capillary: 99

## 2010-10-19 LAB — HEPARIN LEVEL (UNFRACTIONATED)
Heparin Unfractionated: 0.13 — ABNORMAL LOW
Heparin Unfractionated: 0.15 — ABNORMAL LOW
Heparin Unfractionated: 0.16 — ABNORMAL LOW
Heparin Unfractionated: 0.2 — ABNORMAL LOW
Heparin Unfractionated: <0.1 — ABNORMAL LOW
Heparin Unfractionated: <0.1 — ABNORMAL LOW
Heparin Unfractionated: <0.1 — ABNORMAL LOW

## 2010-10-19 LAB — DIFFERENTIAL
Basophils Absolute: 0
Basophils Absolute: 0
Basophils Relative: 0
Basophils Relative: 0
Basophils Relative: 0
Eosinophils Absolute: 0
Eosinophils Absolute: 0.1
Eosinophils Absolute: 0.1
Eosinophils Absolute: 0.2
Eosinophils Relative: 1
Eosinophils Relative: 1
Eosinophils Relative: 2
Eosinophils Relative: 2 % (ref 0–5)
Lymphocytes Relative: 14
Lymphocytes Relative: 10 % — ABNORMAL LOW (ref 12–46)
Lymphs Abs: 0.6 — ABNORMAL LOW
Lymphs Abs: 0.7
Lymphs Abs: 0.9
Lymphs Abs: 1.3 10*3/uL (ref 0.7–4.0)
Monocytes Absolute: 0.2
Monocytes Absolute: 0.4
Monocytes Absolute: 0.6
Monocytes Relative: 5
Neutro Abs: 4.1
Neutro Abs: 4.9
Neutro Abs: 7.3
Neutro Abs: 10.8 10*3/uL — ABNORMAL HIGH (ref 1.7–7.7)
Neutrophils Relative %: 75
Neutrophils Relative %: 85 — ABNORMAL HIGH

## 2010-10-19 LAB — HEPATIC FUNCTION PANEL
ALT: 11
ALT: 19
AST: 17
Albumin: 1.2 — ABNORMAL LOW
Albumin: 1.2 — ABNORMAL LOW
Albumin: 1.6 — ABNORMAL LOW
Alkaline Phosphatase: 111
Alkaline Phosphatase: 206 — ABNORMAL HIGH
Indirect Bilirubin: 0.7
Total Bilirubin: 1.1
Total Protein: 4.2 — ABNORMAL LOW
Total Protein: 4.9 — ABNORMAL LOW
Total Protein: 5.1 — ABNORMAL LOW

## 2010-10-19 LAB — POCT I-STAT 7, (LYTES, BLD GAS, ICA,H+H)
Acid-base deficit: 3 — ABNORMAL HIGH
Acid-base deficit: 4 — ABNORMAL HIGH
Bicarbonate: 20.8
Bicarbonate: 21.5
Bicarbonate: 23.3
Calcium, Ion: 1.06 — ABNORMAL LOW
Calcium, Ion: 1.07 — ABNORMAL LOW
HCT: 29 — ABNORMAL LOW
HCT: 30 — ABNORMAL LOW
Hemoglobin: 9.9 — ABNORMAL LOW
O2 Saturation: 100
O2 Saturation: 96
Patient temperature: 38.2
Patient temperature: 38.5
Patient temperature: 38.9
TCO2: 23
pCO2 arterial: 37.6
pCO2 arterial: 37.8
pCO2 arterial: 40.1
pH, Arterial: 7.33 — ABNORMAL LOW
pO2, Arterial: 309 — ABNORMAL HIGH
pO2, Arterial: 87
pO2, Arterial: 95

## 2010-10-19 LAB — URINE CULTURE
Colony Count: NO GROWTH
Culture: NO GROWTH
Special Requests: NEGATIVE

## 2010-10-19 LAB — HEMOGLOBIN AND HEMATOCRIT, BLOOD
HCT: 27.3 — ABNORMAL LOW
Hemoglobin: 8.2 — ABNORMAL LOW
Hemoglobin: 8.7 — ABNORMAL LOW
Hemoglobin: 8.9 — ABNORMAL LOW
Hemoglobin: 9 — ABNORMAL LOW
Hemoglobin: 9.2 — ABNORMAL LOW
Hemoglobin: 9.2 — ABNORMAL LOW
Hemoglobin: 9.6 — ABNORMAL LOW

## 2010-10-19 LAB — BLOOD GAS, ARTERIAL
Acid-Base Excess: 1.6
Acid-Base Excess: 2.4 — ABNORMAL HIGH
Acid-Base Excess: 2.5 — ABNORMAL HIGH
Acid-base deficit: 0.6
Acid-base deficit: 1
Acid-base deficit: 1.5
Acid-base deficit: 1.8
Acid-base deficit: 2.8 — ABNORMAL HIGH
Acid-base deficit: 5.9 — ABNORMAL HIGH
Acid-base deficit: 9.5 — ABNORMAL HIGH
Bicarbonate: 15.1 — ABNORMAL LOW
Bicarbonate: 17.5 — ABNORMAL LOW
Bicarbonate: 19.6 — ABNORMAL LOW
Bicarbonate: 21.1
Bicarbonate: 22.2
Bicarbonate: 23.3
Bicarbonate: 23.9
Bicarbonate: 23.9
Bicarbonate: 25.6 — ABNORMAL HIGH
Drawn by: 13898
Drawn by: 21338
Drawn by: 24610
FIO2: 0.4
FIO2: 0.4
FIO2: 0.4
FIO2: 0.4
FIO2: 0.4
FIO2: 0.4
FIO2: 0.4
FIO2: 0.6
FIO2: 0.8
FIO2: 1
FIO2: 1
FIO2: 100
MECHVT: 0.5
MECHVT: 400
MECHVT: 400
MECHVT: 500
MECHVT: 500
MECHVT: 500
O2 Saturation: 100
O2 Saturation: 79.2
O2 Saturation: 94.8
O2 Saturation: 97
O2 Saturation: 97.2
O2 Saturation: 97.5
O2 Saturation: 97.6
O2 Saturation: 97.8
O2 Saturation: 97.9
O2 Saturation: 98.7
O2 Saturation: 98.8
O2 Saturation: 99.6
PEEP: 0.5
PEEP: 10
PEEP: 10
PEEP: 5
PEEP: 5
PEEP: 5
PEEP: 5
PEEP: 5
PEEP: 8
Patient temperature: 100.2
Patient temperature: 101.2
Patient temperature: 98.6
Patient temperature: 98.6
Patient temperature: 98.6
Patient temperature: 98.6
Patient temperature: 98.6
Patient temperature: 99.9
Pressure control: 20
Pressure control: 20
RATE: 12
RATE: 12
RATE: 12
RATE: 14
RATE: 14
RATE: 20
RATE: 20
RATE: 20
RATE: 20
RATE: 20
RATE: 30
TCO2: 13.5
TCO2: 18.4
TCO2: 18.6
TCO2: 20.6
TCO2: 21.9
TCO2: 24.5
TCO2: 24.8
TCO2: 24.8
TCO2: 27.2
pCO2 arterial: 26.7 — ABNORMAL LOW
pCO2 arterial: 28.2 — ABNORMAL LOW
pCO2 arterial: 32.1 — ABNORMAL LOW
pCO2 arterial: 33 — ABNORMAL LOW
pCO2 arterial: 34.2 — ABNORMAL LOW
pCO2 arterial: 34.4 — ABNORMAL LOW
pCO2 arterial: 35.5
pCO2 arterial: 36.8
pCO2 arterial: 39.3
pH, Arterial: 7.202 — ABNORMAL LOW
pH, Arterial: 7.368
pH, Arterial: 7.373
pH, Arterial: 7.397
pH, Arterial: 7.409 — ABNORMAL HIGH
pH, Arterial: 7.418 — ABNORMAL HIGH
pH, Arterial: 7.464 — ABNORMAL HIGH
pH, Arterial: 7.47 — ABNORMAL HIGH
pH, Arterial: 7.485 — ABNORMAL HIGH
pH, Arterial: 7.538 — ABNORMAL HIGH
pO2, Arterial: 101 — ABNORMAL HIGH
pO2, Arterial: 102 — ABNORMAL HIGH
pO2, Arterial: 151 — ABNORMAL HIGH
pO2, Arterial: 174 — ABNORMAL HIGH
pO2, Arterial: 201 — ABNORMAL HIGH
pO2, Arterial: 272 — ABNORMAL HIGH
pO2, Arterial: 64.8 — ABNORMAL LOW
pO2, Arterial: 74.9 — ABNORMAL LOW
pO2, Arterial: 90.2
pO2, Arterial: 91.9
pO2, Arterial: 95
pO2, Arterial: 97.6
pO2, Arterial: 99.9

## 2010-10-19 LAB — CROSSMATCH
Antibody Screen: NEGATIVE
Antibody Screen: NEGATIVE

## 2010-10-19 LAB — CARBOXYHEMOGLOBIN
Carboxyhemoglobin: 1.1
Carboxyhemoglobin: 1.3
Methemoglobin: 0.5
Methemoglobin: 1

## 2010-10-19 LAB — COMPREHENSIVE METABOLIC PANEL
ALT: 12
ALT: 14
ALT: 15
ALT: 26
ALT: 65 — ABNORMAL HIGH
AST: 20
AST: 20
AST: 24
AST: 26
AST: 73 — ABNORMAL HIGH
Albumin: 1.1 — ABNORMAL LOW
Albumin: 1.4 — ABNORMAL LOW
Alkaline Phosphatase: 235 — ABNORMAL HIGH
Alkaline Phosphatase: 386 — ABNORMAL HIGH
BUN: 13
BUN: 20
CO2: 20
CO2: 22
CO2: 24
CO2: 24
CO2: 26
Calcium: 7 — ABNORMAL LOW
Calcium: 7.4 — ABNORMAL LOW
Calcium: 8.1 — ABNORMAL LOW
Calcium: 8.2 — ABNORMAL LOW
Calcium: 8.2 — ABNORMAL LOW
Calcium: 9
Chloride: 101
Chloride: 108
Chloride: 109
Chloride: 110
Chloride: 111
Chloride: 115 — ABNORMAL HIGH
Creatinine, Ser: 0.55
Creatinine, Ser: 0.65
Creatinine, Ser: 0.97
GFR calc Af Amer: >60
GFR calc Af Amer: >60
GFR calc Af Amer: >60
GFR calc Af Amer: >60
GFR calc Af Amer: >60
GFR calc Af Amer: >60
GFR calc non Af Amer: 52 — ABNORMAL LOW
GFR calc non Af Amer: 58 — ABNORMAL LOW
GFR calc non Af Amer: >60
GFR calc non Af Amer: >60
GFR calc non Af Amer: >60
GFR calc non Af Amer: >60
Glucose, Bld: 149 — ABNORMAL HIGH
Glucose, Bld: 150 — ABNORMAL HIGH
Potassium: 2.8 — ABNORMAL LOW
Potassium: 2.9 — ABNORMAL LOW
Potassium: 3.9
Potassium: 4.1
Sodium: 140
Sodium: 141
Sodium: 141
Sodium: 142
Sodium: 142
Total Bilirubin: 0.5
Total Bilirubin: 0.8
Total Bilirubin: 0.9
Total Protein: 4.7 — ABNORMAL LOW

## 2010-10-19 LAB — URINALYSIS, ROUTINE W REFLEX MICROSCOPIC
Bilirubin Urine: NEGATIVE
Glucose, UA: NEGATIVE
Glucose, UA: NEGATIVE
Hgb urine dipstick: NEGATIVE
Hgb urine dipstick: NEGATIVE
Ketones, ur: NEGATIVE
Ketones, ur: NEGATIVE
Leukocytes, UA: NEGATIVE
Nitrite: NEGATIVE
Protein, ur: 30 — AB
Specific Gravity, Urine: 1.008
Specific Gravity, Urine: 1.011
Urobilinogen, UA: 0.2
pH: 6
pH: 7.5

## 2010-10-19 LAB — IRON AND TIBC
Iron: <10 — ABNORMAL LOW
UIBC: 111

## 2010-10-19 LAB — CULTURE, BLOOD (ROUTINE X 2)
Culture: NO GROWTH
Culture: NO GROWTH

## 2010-10-19 LAB — VANCOMYCIN, TROUGH
Vancomycin Tr: 19.1
Vancomycin Tr: 9.3 — ABNORMAL LOW

## 2010-10-19 LAB — MAGNESIUM
Magnesium: 1.5
Magnesium: 1.7
Magnesium: 1.8
Magnesium: 1.9
Magnesium: 1.9
Magnesium: 1.9
Magnesium: 1.9
Magnesium: 2.2
Magnesium: 2.2

## 2010-10-19 LAB — PREALBUMIN: Prealbumin: 3.9 — ABNORMAL LOW

## 2010-10-19 LAB — LACTIC ACID, PLASMA: Lactic Acid, Venous: 1.8

## 2010-10-19 LAB — PROTIME-INR
INR: 1.1
INR: 1.4
Prothrombin Time: 17.3 — ABNORMAL HIGH

## 2010-10-19 LAB — URINE MICROSCOPIC-ADD ON

## 2010-10-19 LAB — CULTURE, BAL-QUANTITATIVE W GRAM STAIN
Colony Count: NO GROWTH
Culture: NO GROWTH

## 2010-10-19 LAB — PHOSPHORUS
Phosphorus: 2 — ABNORMAL LOW
Phosphorus: 2.4
Phosphorus: 2.8
Phosphorus: 3.6
Phosphorus: 3.9
Phosphorus: 4.1
Phosphorus: 4.2
Phosphorus: 4.5

## 2010-10-19 LAB — TSH: TSH: 5.471 u[IU]/mL — ABNORMAL HIGH (ref 0.350–4.500)

## 2010-10-19 LAB — CARDIAC PANEL(CRET KIN+CKTOT+MB+TROPI)
CK, MB: 19.5 — ABNORMAL HIGH
Relative Index: 12.6 — ABNORMAL HIGH
Total CK: 155

## 2010-10-19 LAB — FERRITIN: Ferritin: 659 — ABNORMAL HIGH (ref 10–291)

## 2010-10-19 LAB — TRIGLYCERIDES: Triglycerides: 168 — ABNORMAL HIGH

## 2010-10-19 LAB — PREPARE RBC (CROSSMATCH)

## 2010-10-19 LAB — FOLATE: Folate: 5.8

## 2010-10-19 LAB — HEMOGLOBIN A1C: Hgb A1c MFr Bld: 6.5 — ABNORMAL HIGH

## 2010-10-19 LAB — CHOLESTEROL, TOTAL: Cholesterol: 85

## 2010-10-19 LAB — CULTURE, RESPIRATORY W GRAM STAIN

## 2010-10-19 LAB — APTT: aPTT: 34

## 2010-10-19 LAB — LIPID PANEL
HDL: 14 — ABNORMAL LOW
VLDL: 46 — ABNORMAL HIGH

## 2010-10-19 LAB — T3, FREE: T3, Free: 2.5 pg/mL (ref 2.3–4.2)

## 2010-10-19 LAB — T4, FREE: Free T4: 0.93 ng/dL (ref 0.89–1.80)

## 2010-10-19 LAB — TISSUE CULTURE

## 2010-11-17 ENCOUNTER — Other Ambulatory Visit: Payer: Self-pay | Admitting: Cardiology

## 2010-12-27 ENCOUNTER — Encounter: Payer: Self-pay | Admitting: Cardiology

## 2011-02-04 ENCOUNTER — Other Ambulatory Visit: Payer: Self-pay | Admitting: Cardiology

## 2011-02-04 NOTE — Telephone Encounter (Signed)
Refilled hctz one time only 

## 2011-02-23 ENCOUNTER — Other Ambulatory Visit: Payer: Self-pay | Admitting: Cardiology

## 2011-03-09 ENCOUNTER — Other Ambulatory Visit: Payer: Self-pay | Admitting: Cardiology

## 2011-03-11 ENCOUNTER — Encounter: Payer: Self-pay | Admitting: *Deleted

## 2011-03-11 DIAGNOSIS — I214 Non-ST elevation (NSTEMI) myocardial infarction: Secondary | ICD-10-CM | POA: Insufficient documentation

## 2011-03-11 DIAGNOSIS — I255 Ischemic cardiomyopathy: Secondary | ICD-10-CM | POA: Insufficient documentation

## 2011-03-11 DIAGNOSIS — Z8673 Personal history of transient ischemic attack (TIA), and cerebral infarction without residual deficits: Secondary | ICD-10-CM | POA: Insufficient documentation

## 2011-03-16 ENCOUNTER — Ambulatory Visit (INDEPENDENT_AMBULATORY_CARE_PROVIDER_SITE_OTHER): Payer: Medicare Other | Admitting: Cardiology

## 2011-03-16 ENCOUNTER — Encounter: Payer: Self-pay | Admitting: Cardiology

## 2011-03-16 VITALS — BP 142/80 | HR 71 | Ht 61.0 in | Wt 156.4 lb

## 2011-03-16 DIAGNOSIS — Z951 Presence of aortocoronary bypass graft: Secondary | ICD-10-CM | POA: Insufficient documentation

## 2011-03-16 DIAGNOSIS — I2510 Atherosclerotic heart disease of native coronary artery without angina pectoris: Secondary | ICD-10-CM

## 2011-03-16 DIAGNOSIS — I10 Essential (primary) hypertension: Secondary | ICD-10-CM

## 2011-03-16 DIAGNOSIS — I255 Ischemic cardiomyopathy: Secondary | ICD-10-CM

## 2011-03-16 DIAGNOSIS — I2589 Other forms of chronic ischemic heart disease: Secondary | ICD-10-CM

## 2011-03-16 DIAGNOSIS — E785 Hyperlipidemia, unspecified: Secondary | ICD-10-CM

## 2011-03-16 DIAGNOSIS — E119 Type 2 diabetes mellitus without complications: Secondary | ICD-10-CM

## 2011-03-16 MED ORDER — ROSUVASTATIN CALCIUM 20 MG PO TABS: 20.0000 mg | ORAL_TABLET | Freq: Every day | ORAL | Status: DC

## 2011-03-16 MED ORDER — RAMIPRIL 10 MG PO CAPS: 10.0000 mg | ORAL_CAPSULE | Freq: Every day | ORAL | Status: DC

## 2011-03-16 MED ORDER — METOPROLOL TARTRATE 50 MG PO TABS: 50.0000 mg | ORAL_TABLET | Freq: Two times a day (BID) | ORAL | Status: DC

## 2011-03-16 MED ORDER — HYDROCHLOROTHIAZIDE 25 MG PO TABS: 25.0000 mg | ORAL_TABLET | Freq: Every day | ORAL | Status: DC

## 2011-03-16 NOTE — Patient Instructions (Signed)
We will get a copy of your lab work from Dr. Collins Scotland.   Continue your current medication.  I will see you again in 1 year.

## 2011-03-16 NOTE — Assessment & Plan Note (Signed)
She remains asymptomatic from a cardiac standpoint. She's had no anginal symptoms. We'll continue with her aspirin. Continue with risk factor modification and medical therapy. I've encouraged her to stay active.

## 2011-03-16 NOTE — Assessment & Plan Note (Signed)
Blood pressure was high on initial evaluation but came down on repeat. Her readings at home have been excellent. I've made no changes in her therapy and we will continue with ACE inhibitor, metoprolol, and HCTZ.

## 2011-03-16 NOTE — Progress Notes (Signed)
[name] Date of Birth: 1940-05-18 Medical Record #[id number]  History of Present Illness: [name] is seen for yearly followup. She has a history of coronary disease and is status post coronary bypass surgery in 2010. Prior to her bypass surgery her ejection fraction was 35%. Repeat echocardiogram in September of 2011 showed a normal ejection fraction of 65-70 reports that she has done very well since her last visit. She denies any chest pain or shortness of breath. She has had no edema. She walks daily. She checks her blood pressure regularly at [facility] and her readings have been normal. She has lost 9 pounds since her last visit.  Current Outpatient Prescriptions on File Prior to Visit  Medication Sig Dispense Refill  . aspirin 325 MG tablet Take 325 mg by mouth daily.      Marland Kitchen glimepiride (AMARYL) 2 MG tablet Take 2 mg by mouth daily.       . IRON PO Take 65 mg by mouth daily.      . Multiple Vitamin (MULTIVITAMIN) tablet Take 1 tablet by mouth daily.      . Omega-3 Fatty Acids (FISH OIL PO) Take by mouth. Taking 4 daily      . DISCONTD: CRESTOR 20 MG tablet TAKE ONE TABLET BY MOUTH AT BEDTIME  30 each  0  . DISCONTD: hydrochlorothiazide (HYDRODIURIL) 25 MG tablet TAKE ONE TABLET BY MOUTH EVERY DAY. NO MORE REFILLS UNTIL SEEN.  30 tablet  0  . DISCONTD: metoprolol (LOPRESSOR) 50 MG tablet TAKE ONE TABLET BY MOUTH TWICE DAILY  60 tablet  0  . DISCONTD: ramipril (ALTACE) 10 MG capsule TAKE ONE CAPSULE BY MOUTH EVERY DAY  30 capsule  3    No Known Allergies  Past Medical History  Diagnosis Date  . Coronary artery disease   . Ischemic cardiomyopathy     EF 35-40%  . Hypertension   . Status post CVA   . Hyperlipidemia   . Non-ST elevation MI (NSTEMI) 09/2007    hx of  . Sepsis following intra-abdominal surgery   . Diabetes mellitus     Past Surgical History  Procedure Date  . Mitral valve repair   . Coronary artery bypass graft 2010    lima graft to  lad,saphenous vein graft tothe diagonal,saphenous vein graft to the intermediate branch  and saphenous vein graft to the distal right coronary  . Status post partial colectomy   . Cardiac catheterization 04/08/2008    History  Smoking status  . Never Smoker   Smokeless tobacco  . Not on file    History  Alcohol Use     Family History  Problem Relation Age of Onset  . Hypertension Mother   . Heart disease Father     Review of Systems: As noted in history of present illness.  All other systems were reviewed and are negative.  Physical Exam: BP 142/80  Pulse 71  Ht 5\' 1"  (1.549 m)  Wt 156 lb 6.4 oz (70.943 kg)  BMI 29.55 kg/m2 She is a pleasant white female in no acute distress. Her HEENT exam is unremarkable. Pupils are equal round and reactive. Sclera are clear. Oropharynx is clear. Neck is without JVD, adenopathy, thyromegaly, or bruits. Lungs are clear. Cardiac exam reveals a regular rate and rhythm without gallop or murmur. Abdomen is soft and nontender without masses or bruits. She has good femoral and pedal pulses. She has no edema today. Skin is warm and dry. She is alert and  oriented x3. Cranial nerves II through XII are intact. LABORATORY DATA: ECG today demonstrates normal sinus rhythm with a rate of 70 beats per minute. She has evidence of anteroseptal infarct age undetermined. There is left axis deviation.  Assessment / Plan:

## 2011-03-16 NOTE — Assessment & Plan Note (Signed)
She is on chronic statin therapy. I've requested a copy of her most recent lab work from Dr. Collins Scotland.

## 2012-03-15 ENCOUNTER — Other Ambulatory Visit: Payer: Self-pay | Admitting: Cardiology

## 2012-04-03 ENCOUNTER — Telehealth: Payer: Self-pay | Admitting: Cardiology

## 2012-04-03 MED ORDER — METOPROLOL TARTRATE 50 MG PO TABS: ORAL_TABLET | ORAL | Status: DC

## 2012-04-03 MED ORDER — HYDROCHLOROTHIAZIDE 25 MG PO TABS: 25.0000 mg | ORAL_TABLET | Freq: Every day | ORAL | Status: DC

## 2012-04-03 NOTE — Telephone Encounter (Signed)
Returned patient's call and to let her know her request have been sent in.

## 2012-04-03 NOTE — Telephone Encounter (Signed)
New Problem:    Patient called in needing a 30 day refill of her metoprolol (LOPRESSOR) 50 MG tablet and hydrochlorothiazide (HYDRODIURIL) 25 MG tablet.  Please call back once this has filled.

## 2012-04-18 ENCOUNTER — Other Ambulatory Visit: Payer: Self-pay | Admitting: Cardiology

## 2012-05-10 ENCOUNTER — Encounter: Payer: Self-pay | Admitting: Cardiology

## 2012-05-10 ENCOUNTER — Ambulatory Visit (INDEPENDENT_AMBULATORY_CARE_PROVIDER_SITE_OTHER): Payer: 59 | Admitting: Cardiology

## 2012-05-10 VITALS — BP 152/80 | HR 72 | Ht 61.0 in | Wt 164.5 lb

## 2012-05-10 DIAGNOSIS — I10 Essential (primary) hypertension: Secondary | ICD-10-CM

## 2012-05-10 DIAGNOSIS — Z951 Presence of aortocoronary bypass graft: Secondary | ICD-10-CM

## 2012-05-10 DIAGNOSIS — E785 Hyperlipidemia, unspecified: Secondary | ICD-10-CM

## 2012-05-10 DIAGNOSIS — I2510 Atherosclerotic heart disease of native coronary artery without angina pectoris: Secondary | ICD-10-CM

## 2012-05-10 MED ORDER — METOPROLOL TARTRATE 50 MG PO TABS: ORAL_TABLET | ORAL | Status: DC

## 2012-05-10 MED ORDER — HYDROCHLOROTHIAZIDE 25 MG PO TABS: 25.0000 mg | ORAL_TABLET | Freq: Every day | ORAL | Status: DC

## 2012-05-10 MED ORDER — ROSUVASTATIN CALCIUM 20 MG PO TABS: 20.0000 mg | ORAL_TABLET | Freq: Every day | ORAL | Status: DC

## 2012-05-10 NOTE — Patient Instructions (Signed)
Continue your current medication  Keep a diary of your blood pressure readings so you can show these to Dr. Collins Scotland.  I will see you in one year.

## 2012-05-10 NOTE — Progress Notes (Signed)
[name] Date of Birth: 09-Jul-1940 Medical Record #[id number]  History of Present Illness: [name] is seen for yearly followup. She has a history of coronary disease and is status post coronary bypass surgery in 2010. Prior to her bypass surgery her ejection fraction was 35%. Repeat echocardiogram in September of 2011 showed a normal ejection fraction of 65-70%. She reports she has had a good year. She denies any symptoms of chest pain or shortness of breath. She has been following a good diet. She walks every day. Her blood pressure has been up and down. Her dose of ramipril has been increased to 15 mg per day.  Current Outpatient Prescriptions on File Prior to Visit  Medication Sig Dispense Refill  . aspirin 325 MG tablet Take 325 mg by mouth daily.      Marland Kitchen glimepiride (AMARYL) 2 MG tablet Take 2 mg by mouth daily.       . IRON PO Take 65 mg by mouth daily.      . Multiple Vitamin (MULTIVITAMIN) tablet Take 1 tablet by mouth daily.      . Omega-3 Fatty Acids (FISH OIL PO) Take by mouth. Taking 4 daily      . ramipril (ALTACE) 10 MG capsule Take 1 capsule (10 mg total) by mouth daily.  30 capsule  11   No current facility-administered medications on file prior to visit.    No Known Allergies  Past Medical History  Diagnosis Date  . Coronary artery disease   . Ischemic cardiomyopathy     EF 35-40%  . Hypertension   . Status post CVA   . Hyperlipidemia   . Non-ST elevation MI (NSTEMI) 09/2007    hx of  . Sepsis following intra-abdominal surgery   . Diabetes mellitus     Past Surgical History  Procedure Laterality Date  . Mitral valve repair    . Coronary artery bypass graft  2010    lima graft to lad,saphenous vein graft tothe diagonal,saphenous vein graft to the intermediate branch  and saphenous vein graft to the distal right coronary  . Status post partial colectomy    . Cardiac catheterization  04/08/2008    History  Smoking status  . Never Smoker     Smokeless tobacco  . Not on file    History  Alcohol Use     Family History  Problem Relation Age of Onset  . Hypertension Mother   . Heart disease Father     Review of Systems: As noted in history of present illness.  All other systems were reviewed and are negative.  Physical Exam: BP 152/80  Pulse 72  Ht 5\' 1"  (1.549 m)  Wt 164 lb 8 oz (74.617 kg)  BMI 31.1 kg/m2 She is a pleasant white female in no acute distress. Her HEENT exam is unremarkable. Pupils are equal round and reactive. Sclera are clear. Oropharynx is clear. Neck is without JVD, adenopathy, thyromegaly, or bruits. Lungs are clear. Cardiac exam reveals a regular rate and rhythm without gallop or murmur. Abdomen is soft and nontender without masses or bruits. She has good femoral and pedal pulses. She has no edema today. Skin is warm and dry. She is alert and oriented x3. Cranial nerves II through XII are intact. LABORATORY DATA: ECG today demonstrates normal sinus rhythm with a rate of 69 beats per minute. There is left axis deviation and evidence of old infarction. This is unchanged from February 2013.  Assessment / Plan: 1. Coronary  disease status post CABG in 2010. She remains asymptomatic. Last ejection fraction was normal. We will continue on aspirin, metoprolol, and statin therapy.  2. Hypertension. Blood pressure is elevated. I recommended that she keep a diary of her blood pressure readings and take these with her when she sees her primary care on her next visit. Can increase her ACE inhibitor to 20 mg or add amlodipine.  3. Hypercholesterolemia on chronic statin therapy.

## 2013-05-17 ENCOUNTER — Encounter: Payer: Self-pay | Admitting: Cardiology

## 2013-05-17 ENCOUNTER — Ambulatory Visit (INDEPENDENT_AMBULATORY_CARE_PROVIDER_SITE_OTHER): Payer: 59 | Admitting: Cardiology

## 2013-05-17 VITALS — BP 174/86 | HR 69 | Ht 61.0 in | Wt 169.8 lb

## 2013-05-17 DIAGNOSIS — E785 Hyperlipidemia, unspecified: Secondary | ICD-10-CM

## 2013-05-17 DIAGNOSIS — I2510 Atherosclerotic heart disease of native coronary artery without angina pectoris: Secondary | ICD-10-CM

## 2013-05-17 DIAGNOSIS — Z951 Presence of aortocoronary bypass graft: Secondary | ICD-10-CM

## 2013-05-17 DIAGNOSIS — I10 Essential (primary) hypertension: Secondary | ICD-10-CM

## 2013-05-17 MED ORDER — ROSUVASTATIN CALCIUM 20 MG PO TABS: 20.0000 mg | ORAL_TABLET | Freq: Every day | ORAL | Status: DC

## 2013-05-17 MED ORDER — AMLODIPINE BESYLATE 5 MG PO TABS: 5.0000 mg | ORAL_TABLET | Freq: Every day | ORAL | Status: DC

## 2013-05-17 MED ORDER — METOPROLOL TARTRATE 50 MG PO TABS: ORAL_TABLET | ORAL | Status: DC

## 2013-05-17 MED ORDER — HYDROCHLOROTHIAZIDE 25 MG PO TABS: 25.0000 mg | ORAL_TABLET | Freq: Every day | ORAL | Status: DC

## 2013-05-17 NOTE — Patient Instructions (Signed)
Continue your current therapy  We will add amlodipine 5 mg daily for blood pressure  I will get a copy of your lab work from Dr. Collins ScotlandSpear  I will have you follow up in one month.

## 2013-05-17 NOTE — Progress Notes (Signed)
[name] Date of Birth: 01/06/1941 Medical Record #[id number]  History of Present Illness: [name] is seen for yearly followup. She has a history of coronary disease and is status post coronary bypass surgery in 2010. Prior to her bypass surgery her ejection fraction was 35%. Repeat echocardiogram in September of 2011 showed a normal ejection fraction of 65-70%. She reports she has had a good year. She denies any symptoms of chest pain or shortness of breath. She has been following a good diet. She walks every day. Her blood pressure has been consistently elevated at her other doctor's appointments and at the drug store. She states she doesn't eat salt or sweets.  Current Outpatient Prescriptions on File Prior to Visit  Medication Sig Dispense Refill  . aspirin 325 MG tablet Take 325 mg by mouth daily.      Marland Kitchen. glimepiride (AMARYL) 2 MG tablet Take 2 mg by mouth daily.       . IRON PO Take 65 mg by mouth daily.      . Multiple Vitamin (MULTIVITAMIN) tablet Take 1 tablet by mouth daily.      . Omega-3 Fatty Acids (FISH OIL PO) Take by mouth. Taking 4 daily      . ramipril (ALTACE) 10 MG capsule Take 1 capsule (10 mg total) by mouth daily.  30 capsule  11  . ramipril (ALTACE) 5 MG capsule Take 5 mg by mouth daily.       No current facility-administered medications on file prior to visit.    No Known Allergies  Past Medical History  Diagnosis Date  . Coronary artery disease   . Ischemic cardiomyopathy     EF 35-40%  . Hypertension   . Status post CVA   . Hyperlipidemia   . Non-ST elevation MI (NSTEMI) 09/2007    hx of  . Sepsis following intra-abdominal surgery   . Diabetes mellitus     Past Surgical History  Procedure Laterality Date  . Mitral valve repair    . Coronary artery bypass graft  2010    lima graft to lad,saphenous vein graft tothe diagonal,saphenous vein graft to the intermediate branch  and saphenous vein graft to the distal right coronary  . Status  post partial colectomy    . Cardiac catheterization  04/08/2008    History  Smoking status  . Never Smoker   Smokeless tobacco  . Not on file    History  Alcohol Use     Family History  Problem Relation Age of Onset  . Hypertension Mother   . Heart disease Father     Review of Systems: As noted in history of present illness.  All other systems were reviewed and are negative.  Physical Exam: BP 174/86  Pulse 69  Ht 5\' 1"  (1.549 m)  Wt 169 lb 12.8 oz (77.021 kg)  BMI 32.10 kg/m2 She is a pleasant white female in no acute distress. Her HEENT exam is unremarkable.  Neck is without JVD, adenopathy, thyromegaly, or bruits. Lungs are clear. Cardiac exam reveals a regular rate and rhythm without gallop or murmur. Abdomen is soft and nontender without masses or bruits. She has good femoral and pedal pulses. She has no edema today. Skin is warm and dry. She is alert and oriented x3. Cranial nerves II through XII are intact.  LABORATORY DATA: ECG today demonstrates normal sinus rhythm with a rate of 69 beats per minute. There is left axis deviation and evidence of old septal infarction.  This is unchanged from prior Ecgs.  Assessment / Plan: 1. Coronary disease status post CABG in 2010. She remains asymptomatic. Last ejection fraction was normal. We will continue on aspirin, metoprolol, and statin therapy.  2. Hypertension. Blood pressure is high. I recommended that we add amlodipine 5 mg daily. Continue current HCTZ, ramipril, and lopressor. I will have her follow up in one month.  3. Hypercholesterolemia on chronic statin therapy. I have requested her most recent lab work from Dr. Collins ScotlandSpear.

## 2013-06-28 ENCOUNTER — Ambulatory Visit: Payer: 59 | Admitting: Nurse Practitioner

## 2013-07-23 ENCOUNTER — Encounter: Payer: Self-pay | Admitting: Nurse Practitioner

## 2013-07-23 ENCOUNTER — Ambulatory Visit (INDEPENDENT_AMBULATORY_CARE_PROVIDER_SITE_OTHER): Payer: 59 | Admitting: Nurse Practitioner

## 2013-07-23 VITALS — BP 160/88 | HR 78 | Ht 61.0 in | Wt 169.8 lb

## 2013-07-23 DIAGNOSIS — I2510 Atherosclerotic heart disease of native coronary artery without angina pectoris: Secondary | ICD-10-CM

## 2013-07-23 DIAGNOSIS — E785 Hyperlipidemia, unspecified: Secondary | ICD-10-CM

## 2013-07-23 DIAGNOSIS — I10 Essential (primary) hypertension: Secondary | ICD-10-CM

## 2013-07-23 MED ORDER — RAMIPRIL 10 MG PO CAPS: 10.0000 mg | ORAL_CAPSULE | Freq: Two times a day (BID) | ORAL | Status: DC

## 2013-07-23 NOTE — Patient Instructions (Addendum)
Stay on your current medicines but I am increasing your Ramipril to 10 mg two times a day - take 10 mg in the morning and 2 of your 5 mg in the evening. The new prescription for the 10 mg twice a day is at the drug store  Watch your salt -  Try to not have any thing out of a box or can - no more chicken noodle soup  See Dr. SwazilandJordan in May of 2016  Call the [facility] office at [phone] if you have any questions, problems or concerns.     Low-Sodium Eating Plan Sodium raises blood pressure and causes water to be held in the body. Getting less sodium from food will help lower your blood pressure, reduce any swelling, and protect your heart, liver, and kidneys. We get sodium by adding salt (sodium chloride) to food. Most of our sodium comes from canned, boxed, and frozen foods. Restaurant foods, fast foods, and pizza are also very high in sodium. Even if you take medicine to lower your blood pressure or to reduce fluid in your body, getting less sodium from your food is important. WHAT IS MY PLAN? Most people should limit their sodium intake to 2,300 mg a day. Your health care provider recommends that you limit your sodium intake to ___less than 1500 mg_______ a day.  WHAT DO I NEED TO KNOW ABOUT THIS EATING PLAN? For the low-sodium eating plan, you will follow these general guidelines:  Choose foods with a % Daily Value for sodium of less than 5% (as listed on the food label).   Use salt-free seasonings or herbs instead of table salt or sea salt.   Check with your health care provider or pharmacist before using salt substitutes.   Eat fresh foods.  Eat more vegetables and fruits.  Limit canned vegetables. If you do use them, rinse them well to decrease the sodium.   Limit cheese to 1 oz (28 g) per day.   Eat lower-sodium products, often labeled as "lower sodium" or "no salt added."  Avoid foods that contain monosodium glutamate (MSG). MSG is  sometimes added to [place] food and some canned foods.  Check food labels (Nutrition Facts labels) on foods to learn how much sodium is in one serving.  Eat more home-cooked food and less restaurant, buffet, and fast food.  When eating at a restaurant, ask that your food be prepared with less salt or none, if possible.  HOW DO I READ FOOD LABELS FOR SODIUM INFORMATION? The Nutrition Facts label lists the amount of sodium in one serving of the food. If you eat more than one serving, you must multiply the listed amount of sodium by the number of servings. Food labels may also identify foods as:  Sodium free--Less than 5 mg in a serving.  Very low sodium--35 mg or less in a serving.  Low sodium--140 mg or less in a serving.  Light in sodium--50% less sodium in a serving. For example, if a food that usually has 300 mg of sodium is changed to become light in sodium, it will have 150 mg of sodium.  Reduced sodium--25% less sodium in a serving. For example, if a food that usually has 400 mg of sodium is changed to reduced sodium, it will have 300 mg of sodium. WHAT FOODS CAN I EAT? Grains Low-sodium cereals, including oats, puffed wheat and rice, and shredded wheat cereals. Low-sodium crackers. Unsalted rice and pasta. Lower-sodium bread.  Vegetables  Frozen or fresh vegetables. Low-sodium or reduced-sodium canned vegetables. Low-sodium or reduced-sodium tomato sauce and paste. Low-sodium or reduced-sodium tomato and vegetable juices.  Fruits Fresh, frozen, and canned fruit. Fruit juice.  Meat and Other Protein Products Low-sodium canned tuna and salmon. Fresh or frozen meat, poultry, seafood, and fish. Lamb. Unsalted nuts. Dried beans, peas, and lentils without added salt. Unsalted canned beans. Homemade soups without salt. Eggs.  Dairy Milk. Soy milk. Ricotta cheese. Low-sodium or reduced-sodium cheeses. Yogurt.  Condiments Fresh and dried herbs and spices. Salt-free seasonings.  Onion and garlic powders. Low-sodium varieties of mustard and ketchup. Lemon juice.  Fats and Oils Reduced-sodium salad dressings. Unsalted butter.  Other Unsalted popcorn and pretzels.  The items listed above may not be a complete list of recommended foods or beverages. Contact your dietitian for more options. WHAT FOODS ARE NOT RECOMMENDED? Grains Instant hot cereals. Bread stuffing, pancake, and biscuit mixes. Croutons. Seasoned rice or pasta mixes. Noodle soup cups. Boxed or frozen macaroni and cheese. Self-rising flour. Regular salted crackers. Vegetables Regular canned vegetables. Regular canned tomato sauce and paste. Regular tomato and vegetable juices. Frozen vegetables in sauces. Salted french fries. Olives. Rosita FirePickles. Relishes. Sauerkraut. Salsa. Meat and Other Protein Products Salted, canned, smoked, spiced, or pickled meats, seafood, or fish. Bacon, ham, sausage, hot dogs, corned beef, chipped beef, and packaged luncheon meats. Salt pork. Jerky. Pickled herring. Anchovies, regular canned tuna, and sardines. Salted nuts. Dairy Processed cheese and cheese spreads. Cheese curds. Blue cheese and cottage cheese. Buttermilk.  Condiments Onion and garlic salt, seasoned salt, table salt, and sea salt. Canned and packaged gravies. Worcestershire sauce. Tartar sauce. Barbecue sauce. Teriyaki sauce. Soy sauce, including reduced sodium. Steak sauce. Fish sauce. Oyster sauce. Cocktail sauce. Horseradish. Regular ketchup and mustard. Meat flavorings and tenderizers. Bouillon cubes. Hot sauce. Tabasco sauce. Marinades. Taco seasonings. Relishes. Fats and Oils Regular salad dressings. Salted butter. Margarine. Ghee. Bacon fat.  Other Potato and tortilla chips. Corn chips and puffs. Salted popcorn and pretzels. Canned or dried soups. Pizza. Frozen entrees and pot pies.  The items listed above may not be a complete list of foods and beverages to avoid. Contact your dietitian for more  information. Document Released: 06/25/2001 Document Revised: 01/08/2013 Document Reviewed: 11/07/2012 [facility] Patient Information 2015 [employer] [place]. This information is not intended to replace advice given to you by your health care provider. Make sure you discuss any questions you have with your health care provider.

## 2013-07-23 NOTE — Progress Notes (Signed)
Park PopeShirley H Musich Date of Birth: 06-17-1940 Medical Record #[id number]  History of Present Illness: Ms. [name] is seen back today for a 2 month check. Seen for Dr. SwazilandJordan. She has a history of CAD and had CABG in 2010. EF prior to surgery was 35%. Repeat echo from 2011 showed EF of 65 to 70%.  Other issues include HTN, past CVA, HLD and DM.  Seen here 2 months ago - was doing ok - BP was up and Norvasc was added.   Comes in today. Here alone. She is doing ok. No chest pain. Not short of breath. Not dizzy or lightheaded. BP running about the same at home and at the drug store. She tells me she does not use salt but as we talk more, she is eating a can of chicken noodle soup every day for dinner - has done so for years. Did not realize that it is high in salt. She is pretty active. She feels like she is doing well. Does have some swelling in her feet - no worse with the addition of Norvasc.  Current Outpatient Prescriptions  Medication Sig Dispense Refill  . amLODipine (NORVASC) 5 MG tablet Take 1 tablet (5 mg total) by mouth daily.  30 tablet  11  . aspirin 325 MG tablet Take 325 mg by mouth daily.      Marland Kitchen. glimepiride (AMARYL) 2 MG tablet Take 2 mg by mouth daily.       . hydrochlorothiazide (HYDRODIURIL) 25 MG tablet Take 1 tablet (25 mg total) by mouth daily.  30 tablet  11  . IRON PO Take 65 mg by mouth daily.      . metoprolol (LOPRESSOR) 50 MG tablet TAKE ONE TABLET BY MOUTH TWICE DAILY  60 tablet  11  . Multiple Vitamin (MULTIVITAMIN) tablet Take 1 tablet by mouth daily.      . Omega-3 Fatty Acids (FISH OIL PO) Take by mouth. Taking 4 daily      . ramipril (ALTACE) 10 MG capsule Take 1 capsule (10 mg total) by mouth daily.  30 capsule  11  . ramipril (ALTACE) 5 MG capsule Take 5 mg by mouth daily.      . rosuvastatin (CRESTOR) 20 MG tablet Take 1 tablet (20 mg total) by mouth daily.  30 tablet  11   No current facility-administered medications for this visit.    No Known  Allergies  Past Medical History  Diagnosis Date  . Coronary artery disease   . Ischemic cardiomyopathy     EF 35-40%  . Hypertension   . Status post CVA   . Hyperlipidemia   . Non-ST elevation MI (NSTEMI) 09/2007    hx of  . Sepsis following intra-abdominal surgery   . Diabetes mellitus     Past Surgical History  Procedure Laterality Date  . Mitral valve repair    . Coronary artery bypass graft  2010    lima graft to lad,saphenous vein graft tothe diagonal,saphenous vein graft to the intermediate branch  and saphenous vein graft to the distal right coronary  . Status post partial colectomy    . Cardiac catheterization  04/08/2008    History  Smoking status  . Never Smoker   Smokeless tobacco  . Not on file    History  Alcohol Use No    Family History  Problem Relation Age of Onset  . Hypertension Mother   . Heart disease Father     Review of Systems: The review of  systems is per the HPI.  All other systems were reviewed and are negative.  Physical Exam: BP 160/88  Pulse 78  Ht 5\' 1"  (1.549 m)  Wt 169 lb 12.8 oz (77.021 kg)  BMI 32.10 kg/m2  SpO2 97% Patient is very pleasant and in no acute distress. Skin is warm and dry. Color is normal.  HEENT is unremarkable. Normocephalic/atraumatic. PERRL. Sclera are nonicteric. Neck is supple. No masses. No JVD. Lungs are clear. Cardiac exam shows a regular rate and rhythm. Abdomen is soft. Extremities are with trace ankle edema. Gait and ROM are intact. No gross neurologic deficits noted.  Wt Readings from Last 3 Encounters:  07/23/13 169 lb 12.8 oz (77.021 kg)  05/17/13 169 lb 12.8 oz (77.021 kg)  05/10/12 164 lb 8 oz (74.617 kg)    LABORATORY DATA/PROCEDURES:  Lab Results  Component Value Date   WBC 6.1 09/17/2008   HGB 10.5* 09/17/2008   HCT 31.1* 09/17/2008   PLT 124* 09/17/2008   GLUCOSE 122* 09/11/2008   CHOL  Value: 201        ATP III CLASSIFICATION:  <200     mg/dL   Desirable  [id number]  mg/dL   Borderline High   >=914>=240    mg/dL   High* 78/29/562110/23/2009   TRIG 231* 11/09/2007   HDL 14* 11/09/2007   LDLCALC  Value: 141        Total Cholesterol/HDL:CHD Risk Coronary Heart Disease Risk Table                     Men   Women  1/2 Average Risk   3.4   3.3* 11/09/2007   ALT 53* 09/11/2008   AST 45* 09/11/2008   NA 141 09/11/2008   K 4.0 DELTA CHECK NOTED 09/17/2008   CL 104 09/11/2008   CREATININE 0.63 09/17/2008   BUN 13 09/11/2008   CO2 26 09/11/2008   TSH 5.471 Test methodology is 3rd generation TSH 11/15/2007   INR 0.9 09/11/2008   HGBA1C  Value: 6.3 (NOTE)   The ADA recommends the following therapeutic goal for glycemic   control related to Hgb A1C measurement:   Goal of Therapy:   < 7.0% Hgb A1C   Reference: American Diabetes Association: Clinical Practice   Recommendations 2008, Diabetes Care,  2008, 31:(Suppl 1).* 04/17/2008    BNP (last 3 results) No results found for this basename: PROBNP,  in the last 8760 hours   Assessment / Plan: 1. HTN - has not taken her medicines yet today - BP not any better at home per her report - she is getting too much salt - discussed at length today. Increase her Altace to 10 mg BID. She says she cannot afford to come back here for a recheck. I have asked her to continue to monitor at home.   2. CAD with prior CABG -  No active symptoms reported.  3. Past LV dysfunction - with last EF normalized by echo in 2011.  4. HLD - on statin  5. DM -  Followed by PCP  See back next May by Dr. SwazilandJordan. Will be available as needed. Low salt diet encouraged.   Patient is agreeable to this plan and will call if any problems develop in the interim.   Rosalio MacadamiaLori C. Emanuele Mcwhirter, RN, ANP-C [facility] [address] [place], [place]  [id number] [phone]

## 2014-02-14 ENCOUNTER — Encounter: Payer: Self-pay | Admitting: Cardiology

## 2014-02-25 ENCOUNTER — Telehealth: Payer: Self-pay | Admitting: Cardiology

## 2014-02-26 NOTE — Telephone Encounter (Signed)
Closed encounter °

## 2014-04-25 ENCOUNTER — Encounter: Payer: Self-pay | Admitting: Cardiology

## 2014-04-25 ENCOUNTER — Ambulatory Visit (INDEPENDENT_AMBULATORY_CARE_PROVIDER_SITE_OTHER): Payer: Medicare PPO | Admitting: Cardiology

## 2014-04-25 VITALS — BP 156/78 | HR 65 | Ht 61.0 in | Wt 164.5 lb

## 2014-04-25 DIAGNOSIS — Z951 Presence of aortocoronary bypass graft: Secondary | ICD-10-CM

## 2014-04-25 DIAGNOSIS — I25810 Atherosclerosis of coronary artery bypass graft(s) without angina pectoris: Secondary | ICD-10-CM | POA: Diagnosis not present

## 2014-04-25 DIAGNOSIS — I10 Essential (primary) hypertension: Secondary | ICD-10-CM

## 2014-04-25 DIAGNOSIS — E785 Hyperlipidemia, unspecified: Secondary | ICD-10-CM

## 2014-04-25 MED ORDER — HYDROCHLOROTHIAZIDE 25 MG PO TABS: 25.0000 mg | ORAL_TABLET | Freq: Every day | ORAL | Status: DC

## 2014-04-25 MED ORDER — METOPROLOL TARTRATE 50 MG PO TABS: ORAL_TABLET | ORAL | Status: DC

## 2014-04-25 MED ORDER — AMLODIPINE BESYLATE 5 MG PO TABS: 5.0000 mg | ORAL_TABLET | Freq: Every day | ORAL | Status: DC

## 2014-04-25 MED ORDER — ROSUVASTATIN CALCIUM 20 MG PO TABS: 20.0000 mg | ORAL_TABLET | Freq: Every day | ORAL | Status: DC

## 2014-04-25 MED ORDER — RAMIPRIL 10 MG PO CAPS: 10.0000 mg | ORAL_CAPSULE | Freq: Two times a day (BID) | ORAL | Status: DC

## 2014-04-25 NOTE — Patient Instructions (Signed)
Continue your current therapy  I will see you in one year   

## 2014-04-25 NOTE — Progress Notes (Signed)
[name] Date of Birth: [birth year] Medical Record #[id number]  History of Present Illness: [name] is seen for  followup CAD. She has a history of coronary disease and is status post coronary bypass surgery in 2010. Prior to her bypass surgery her ejection fraction was 35%. Repeat echocardiogram in September of 2011 showed a normal ejection fraction of 65-70%. She reports she has had a good year. She denies any symptoms of chest pain or shortness of breath. She has been following a good diet. She walks every day. She reports her blood pressure has been controlled at her other doctor visits. She hasn't taken her medication yet this am.   Current Outpatient Prescriptions on File Prior to Visit  Medication Sig Dispense Refill  . aspirin 325 MG tablet Take 325 mg by mouth daily.    Marland Kitchen glimepiride (AMARYL) 2 MG tablet Take 2 mg by mouth daily.     . IRON PO Take 65 mg by mouth daily.    . Multiple Vitamin (MULTIVITAMIN) tablet Take 1 tablet by mouth daily.    . Omega-3 Fatty Acids (FISH OIL PO) Take by mouth. Taking 4 daily     No current facility-administered medications on file prior to visit.    No Known Allergies  Past Medical History  Diagnosis Date  . Coronary artery disease   . Ischemic cardiomyopathy     EF 35-40%  . Hypertension   . Status post CVA   . Hyperlipidemia   . Non-ST elevation MI (NSTEMI) 09/2007    hx of  . Sepsis following intra-abdominal surgery   . Diabetes mellitus     Past Surgical History  Procedure Laterality Date  . Mitral valve repair    . Coronary artery bypass graft  2010    lima graft to lad,saphenous vein graft tothe diagonal,saphenous vein graft to the intermediate branch  and saphenous vein graft to the distal right coronary  . Status post partial colectomy    . Cardiac catheterization  04/08/2008    History  Smoking status  . Never Smoker   Smokeless tobacco  . Not on file    History  Alcohol Use No    Family  History  Problem Relation Age of Onset  . Hypertension Mother   . Heart disease Father     Review of Systems: As noted in history of present illness.  All other systems were reviewed and are negative.  Physical Exam: BP 156/78 mmHg  Pulse 65  Ht  (1.549 m)  Wt 164 lb 8 oz (74.617 kg)  BMI 31.10 kg/m2 She is a pleasant white female in no acute distress. Her HEENT exam is unremarkable.  Neck is without JVD, adenopathy, thyromegaly, or bruits. Lungs are clear. Cardiac exam reveals a regular rate and rhythm without gallop or murmur. Abdomen is soft and nontender without masses or bruits. She has good femoral and pedal pulses. She has no edema today. Skin is warm and dry. She is alert and oriented x3. Cranial nerves II through XII are intact.  LABORATORY DATA: ECG today demonstrates normal sinus rhythm with a rate of 65 beats per minute. There is left axis deviation and evidence of old septal infarction. This is unchanged from prior Ecgs. I have personally reviewed and interpreted this study.   Assessment / Plan: 1. Coronary disease status post CABG in 2010. She remains asymptomatic. Last ejection fraction was normal. We will continue on aspirin, metoprolol, and statin therapy.  2. Hypertension. Blood pressure  is elevated but she reports good control otherwise. I recommend continuing current HCTZ, ramipril, amlodipine, and lopressor.   3. Hypercholesterolemia on chronic statin therapy. Labs reviewed from primary care in Jan. Chol-149, trig-249, HDL 32, LDL-68. Continue therapy and lose weight.  I will follow up in one year.

## 2014-08-12 ENCOUNTER — Other Ambulatory Visit: Payer: Self-pay | Admitting: Nurse Practitioner

## 2014-08-20 ENCOUNTER — Other Ambulatory Visit: Payer: Self-pay | Admitting: Nurse Practitioner

## 2014-11-13 ENCOUNTER — Telehealth: Payer: Self-pay

## 2014-11-13 MED ORDER — RAMIPRIL 10 MG PO CAPS: 10.0000 mg | ORAL_CAPSULE | Freq: Two times a day (BID) | ORAL | Status: DC

## 2014-11-13 NOTE — Telephone Encounter (Signed)
Received a call from patient requesting altace refill.90 day refill sent to pharmacy.

## 2015-04-24 ENCOUNTER — Encounter: Payer: Self-pay | Admitting: *Deleted

## 2015-04-28 ENCOUNTER — Encounter: Payer: Self-pay | Admitting: Cardiology

## 2015-04-28 ENCOUNTER — Ambulatory Visit (INDEPENDENT_AMBULATORY_CARE_PROVIDER_SITE_OTHER): Payer: Medicare Other | Admitting: Cardiology

## 2015-04-28 VITALS — BP 154/80 | HR 71 | Ht 61.0 in | Wt 161.0 lb

## 2015-04-28 DIAGNOSIS — E119 Type 2 diabetes mellitus without complications: Secondary | ICD-10-CM

## 2015-04-28 DIAGNOSIS — E785 Hyperlipidemia, unspecified: Secondary | ICD-10-CM

## 2015-04-28 DIAGNOSIS — I25810 Atherosclerosis of coronary artery bypass graft(s) without angina pectoris: Secondary | ICD-10-CM | POA: Diagnosis not present

## 2015-04-28 DIAGNOSIS — Z951 Presence of aortocoronary bypass graft: Secondary | ICD-10-CM

## 2015-04-28 DIAGNOSIS — I10 Essential (primary) hypertension: Secondary | ICD-10-CM | POA: Diagnosis not present

## 2015-04-28 MED ORDER — ROSUVASTATIN CALCIUM 20 MG PO TABS: 20.0000 mg | ORAL_TABLET | Freq: Every day | ORAL | Status: DC

## 2015-04-28 MED ORDER — HYDROCHLOROTHIAZIDE 25 MG PO TABS: 25.0000 mg | ORAL_TABLET | Freq: Every day | ORAL | Status: DC

## 2015-04-28 MED ORDER — METOPROLOL TARTRATE 50 MG PO TABS: ORAL_TABLET | ORAL | Status: DC

## 2015-04-28 MED ORDER — AMLODIPINE BESYLATE 5 MG PO TABS: 5.0000 mg | ORAL_TABLET | Freq: Every day | ORAL | Status: DC

## 2015-04-28 MED ORDER — ASPIRIN EC 81 MG PO TBEC: 81.0000 mg | DELAYED_RELEASE_TABLET | Freq: Every day | ORAL | Status: AC

## 2015-04-28 MED ORDER — RAMIPRIL 10 MG PO CAPS: 10.0000 mg | ORAL_CAPSULE | Freq: Two times a day (BID) | ORAL | Status: DC

## 2015-04-28 NOTE — Patient Instructions (Signed)
Reduce aspirin to 81 mg daily  Continue your other therapy  I will see you in one year

## 2015-04-30 NOTE — Progress Notes (Signed)
Park PopeShirley H Lopez Date of Birth: [birth year] Medical Record #[id number]  History of Present Illness: [name] is seen for  followup CAD. She has a history of coronary disease and is status post coronary bypass surgery in 2010. Prior to her bypass surgery her ejection fraction was 35%. Repeat echocardiogram in September of 2011 showed a normal ejection fraction of 65-70%.  On follow up today she reports she has had a good year. She denies any symptoms of chest pain or shortness of breath. She has been following a good diet. She walks twice a day.  Current Outpatient Prescriptions on File Prior to Visit  Medication Sig Dispense Refill  . glimepiride (AMARYL) 2 MG tablet Take 2 mg by mouth daily.     . IRON PO Take 65 mg by mouth daily.    . Multiple Vitamin (MULTIVITAMIN) tablet Take 1 tablet by mouth daily.    . Omega-3 Fatty Acids (FISH OIL PO) Take by mouth. Taking 4 daily     No current facility-administered medications on file prior to visit.    No Known Allergies  Past Medical History  Diagnosis Date  . Coronary artery disease   . Ischemic cardiomyopathy     EF 35-40%  . Hypertension   . Status post CVA   . Hyperlipidemia   . Non-ST elevation MI (NSTEMI) (HCC) 09/2007    hx of  . Sepsis following intra-abdominal surgery (HCC)   . Diabetes mellitus     Past Surgical History  Procedure Laterality Date  . Mitral valve repair    . Coronary artery bypass graft  2010    lima graft to lad,saphenous vein graft tothe diagonal,saphenous vein graft to the intermediate branch  and saphenous vein graft to the distal right coronary  . Status post partial colectomy    . Cardiac catheterization  04/08/2008    History  Smoking status  . Never Smoker   Smokeless tobacco  . Not on file    History  Alcohol Use No    Family History  Problem Relation Age of Onset  . Hypertension Mother   . Heart disease Father     PPM  . Melanoma Mother     Review of Systems: As  noted in history of present illness.  All other systems were reviewed and are negative.  Physical Exam: BP 154/80 mmHg  Pulse 71  Ht 5\' 1"  (1.549 m)  Wt 73.029 kg (161 lb)  BMI 30.44 kg/m2 She is a pleasant white female in no acute distress. Her HEENT exam is unremarkable.  Neck is without JVD, adenopathy, thyromegaly, or bruits. Lungs are clear. Cardiac exam reveals a regular rate and rhythm without gallop or murmur. Abdomen is soft and nontender without masses or bruits. She has good femoral and pedal pulses. She has no edema today. Skin is warm and dry. She is alert and oriented x3. Cranial nerves II through XII are intact.  LABORATORY DATA: ECG today demonstrates normal sinus rhythm with PVCs and a rate of 71 beats per minute. There is LAFB and NS TWA. I have personally reviewed and interpreted this study.   Assessment / Plan: 1. Coronary disease status post CABG in 2010. She remains asymptomatic. Last ejection fraction was normal. We will continue on aspirin, metoprolol, and statin therapy. We discussed follow up stress testing since she is 7 years out from CABG and did not have typical angina on presentation. She would like to postpone this since her husband is currently having some  health issues. Will follow up next year and discuss again.  2. Hypertension. Blood pressure is mildly elevated but she reports good control otherwise. I recommend continuing current HCTZ, ramipril, amlodipine, and lopressor.   3. Hypercholesterolemia on chronic statin therapy. Will check lipid panel with next lab draw at primary care next month.  I will follow up in one year.

## 2015-07-07 ENCOUNTER — Encounter (HOSPITAL_COMMUNITY): Payer: Self-pay | Admitting: Emergency Medicine

## 2015-07-07 ENCOUNTER — Emergency Department (HOSPITAL_COMMUNITY)
Admission: EM | Admit: 2015-07-07 | Discharge: 2015-07-07 | Disposition: A | Discharge status: Home / Self Care | Payer: Medicare Other | Attending: Emergency Medicine | Admitting: Emergency Medicine

## 2015-07-07 DIAGNOSIS — R55 Syncope and collapse: Secondary | ICD-10-CM | POA: Insufficient documentation

## 2015-07-07 DIAGNOSIS — Z7984 Long term (current) use of oral hypoglycemic drugs: Secondary | ICD-10-CM | POA: Insufficient documentation

## 2015-07-07 DIAGNOSIS — Z951 Presence of aortocoronary bypass graft: Secondary | ICD-10-CM | POA: Diagnosis not present

## 2015-07-07 DIAGNOSIS — E785 Hyperlipidemia, unspecified: Secondary | ICD-10-CM | POA: Diagnosis not present

## 2015-07-07 DIAGNOSIS — K529 Noninfective gastroenteritis and colitis, unspecified: Secondary | ICD-10-CM | POA: Insufficient documentation

## 2015-07-07 DIAGNOSIS — R112 Nausea with vomiting, unspecified: Secondary | ICD-10-CM | POA: Diagnosis present

## 2015-07-07 DIAGNOSIS — Z7982 Long term (current) use of aspirin: Secondary | ICD-10-CM | POA: Diagnosis not present

## 2015-07-07 DIAGNOSIS — I2510 Atherosclerotic heart disease of native coronary artery without angina pectoris: Secondary | ICD-10-CM | POA: Insufficient documentation

## 2015-07-07 DIAGNOSIS — I10 Essential (primary) hypertension: Secondary | ICD-10-CM | POA: Diagnosis not present

## 2015-07-07 DIAGNOSIS — I252 Old myocardial infarction: Secondary | ICD-10-CM | POA: Diagnosis not present

## 2015-07-07 DIAGNOSIS — E119 Type 2 diabetes mellitus without complications: Secondary | ICD-10-CM | POA: Diagnosis not present

## 2015-07-07 LAB — URINALYSIS, ROUTINE W REFLEX MICROSCOPIC
Bilirubin Urine: NEGATIVE
GLUCOSE, UA: NEGATIVE mg/dL
Ketones, ur: NEGATIVE mg/dL
Leukocytes, UA: NEGATIVE
Nitrite: POSITIVE — AB
PH: 7 (ref 5.0–8.0)
PROTEIN: NEGATIVE mg/dL
SPECIFIC GRAVITY, URINE: 1.021 (ref 1.005–1.030)

## 2015-07-07 LAB — COMPREHENSIVE METABOLIC PANEL
ALT: 20 U/L (ref 14–54)
AST: 23 U/L (ref 15–41)
Albumin: 3.2 g/dL — ABNORMAL LOW (ref 3.5–5.0)
Alkaline Phosphatase: 81 U/L (ref 38–126)
Anion gap: 8 (ref 5–15)
BUN: 24 mg/dL — ABNORMAL HIGH (ref 6–20)
CHLORIDE: 102 mmol/L (ref 101–111)
CO2: 25 mmol/L (ref 22–32)
CREATININE: 0.77 mg/dL (ref 0.44–1.00)
Calcium: 9.6 mg/dL (ref 8.9–10.3)
GFR calc non Af Amer: >60 mL/min (ref >60)
Glucose, Bld: 157 mg/dL — ABNORMAL HIGH (ref 65–99)
POTASSIUM: 3.3 mmol/L — AB (ref 3.5–5.1)
SODIUM: 135 mmol/L (ref 135–145)
Total Bilirubin: 0.4 mg/dL (ref 0.3–1.2)
Total Protein: 6.1 g/dL — ABNORMAL LOW (ref 6.5–8.1)

## 2015-07-07 LAB — CBC WITH DIFFERENTIAL/PLATELET
BASOS ABS: 0 10*3/uL (ref 0.0–0.1)
BASOS PCT: 0 %
Eosinophils Absolute: 0 10*3/uL (ref 0.0–0.7)
Eosinophils Relative: 0 %
LYMPHS PCT: 15 %
Lymphs Abs: 2.3 10*3/uL (ref 0.7–4.0)
MONO ABS: 0.6 10*3/uL (ref 0.1–1.0)
Monocytes Relative: 4 %
NEUTROS ABS: 12.8 10*3/uL — AB (ref 1.7–7.7)
Neutrophils Relative %: 81 %

## 2015-07-07 LAB — CBC
HEMATOCRIT: 29.3 % — AB (ref 36.0–46.0)
Hemoglobin: 9.4 g/dL — ABNORMAL LOW (ref 12.0–15.0)
MCH: 27 pg (ref 26.0–34.0)
MCHC: 32.1 g/dL (ref 30.0–36.0)
MCV: 84.2 fL (ref 78.0–100.0)
PLATELETS: 263 10*3/uL (ref 150–400)
RBC: 3.48 MIL/uL — AB (ref 3.87–5.11)
RDW: 14.2 % (ref 11.5–15.5)
WBC: 15.9 10*3/uL — ABNORMAL HIGH (ref 4.0–10.5)

## 2015-07-07 LAB — URINE MICROSCOPIC-ADD ON

## 2015-07-07 LAB — LIPASE, BLOOD: LIPASE: 31 U/L (ref 11–51)

## 2015-07-07 MED ORDER — POTASSIUM CHLORIDE CRYS ER 20 MEQ PO TBCR
40.0000 meq | EXTENDED_RELEASE_TABLET | Freq: Once | ORAL | Status: AC
  Administered 2015-07-07: 40 meq via ORAL
  Filled 2015-07-07: qty 2

## 2015-07-07 MED ORDER — CIPROFLOXACIN IN D5W 400 MG/200ML IV SOLN
400.0000 mg | Freq: Once | INTRAVENOUS | Status: AC
  Administered 2015-07-07: 400 mg via INTRAVENOUS
  Filled 2015-07-07: qty 200

## 2015-07-07 MED ORDER — SODIUM CHLORIDE 0.9 % IV BOLUS (SEPSIS)
1000.0000 mL | Freq: Once | INTRAVENOUS | Status: AC
  Administered 2015-07-07: 1000 mL via INTRAVENOUS

## 2015-07-07 MED ORDER — CIPROFLOXACIN HCL 500 MG PO TABS: 500.0000 mg | ORAL_TABLET | Freq: Two times a day (BID) | ORAL | Status: DC

## 2015-07-07 MED ORDER — METRONIDAZOLE IN NACL 5-0.79 MG/ML-% IV SOLN
500.0000 mg | Freq: Once | INTRAVENOUS | Status: AC
  Administered 2015-07-07: 500 mg via INTRAVENOUS
  Filled 2015-07-07: qty 100

## 2015-07-07 MED ORDER — METRONIDAZOLE 500 MG PO TABS: 500.0000 mg | ORAL_TABLET | Freq: Four times a day (QID) | ORAL | Status: DC

## 2015-07-07 NOTE — ED Provider Notes (Signed)
CSN: [id number]     Arrival date & time 07/07/15  1103 History   First MD Initiated Contact with Patient 07/07/15 1142     Chief Complaint  Patient presents with  . Nausea  . Emesis  . Near Syncope     (Consider location/radiation/quality/duration/timing/severity/associated sxs/prior Treatment) Patient is a 75 y.o. female presenting with vomiting and near-syncope. The history is provided by the patient (Patient complains of vomiting and diarrhea for over a week.).  Emesis Severity:  Moderate Timing:  Constant Quality:  Malodorous material Able to tolerate:  Liquids Progression:  Unchanged Chronicity:  New Recent urination:  Normal Context: not post-tussive   Relieved by:  Nothing Associated symptoms: diarrhea   Associated symptoms: no abdominal pain and no headaches   Near Syncope Pertinent negatives include no chest pain, no abdominal pain and no headaches.    Past Medical History  Diagnosis Date  . Coronary artery disease   . Ischemic cardiomyopathy     EF 35-40%  . Hypertension   . Status post CVA   . Hyperlipidemia   . Non-ST elevation MI (NSTEMI) (HCC) 09/2007    hx of  . Sepsis following intra-abdominal surgery (HCC)   . Diabetes mellitus    Past Surgical History  Procedure Laterality Date  . Mitral valve repair    . Coronary artery bypass graft  2010    lima graft to lad,saphenous vein graft tothe diagonal,saphenous vein graft to the intermediate branch  and saphenous vein graft to the distal right coronary  . Status post partial colectomy    . Cardiac catheterization  04/08/2008   Family History  Problem Relation Age of Onset  . Hypertension Mother   . Heart disease Father     PPM  . Melanoma Mother    Social History  Substance Use Topics  . Smoking status: Never Smoker   . Smokeless tobacco: None  . Alcohol Use: No   OB History    No data available     Review of Systems  Constitutional: Negative for appetite change and fatigue.  HENT:  Negative for congestion, ear discharge and sinus pressure.   Eyes: Negative for discharge.  Respiratory: Negative for cough.   Cardiovascular: Positive for near-syncope. Negative for chest pain.  Gastrointestinal: Positive for vomiting and diarrhea. Negative for abdominal pain.  Genitourinary: Negative for frequency and hematuria.  Musculoskeletal: Negative for back pain.  Skin: Negative for rash.  Neurological: Negative for seizures and headaches.  Psychiatric/Behavioral: Negative for hallucinations.      Allergies  Review of patient's allergies indicates no known allergies.  Home Medications   Prior to Admission medications   Medication Sig Start Date End Date Taking? Authorizing Provider  amLODipine (NORVASC) 5 MG tablet Take 1 tablet (5 mg total) by mouth daily. 04/28/15   Peter M [place], MD  aspirin EC 81 MG tablet Take 1 tablet (81 mg total) by mouth daily. 04/28/15   Peter M [place], MD  ciprofloxacin (CIPRO) 500 MG tablet Take 1 tablet (500 mg total) by mouth 2 (two) times daily. 07/07/15   Bethann Berkshire, MD  glimepiride (AMARYL) 2 MG tablet Take 2 mg by mouth daily.  02/23/11   Historical Provider, MD  hydrochlorothiazide (HYDRODIURIL) 25 MG tablet Take 1 tablet (25 mg total) by mouth daily. 04/28/15   Peter M [place], MD  IRON PO Take 65 mg by mouth daily.    Historical Provider, MD  metoprolol (LOPRESSOR) 50 MG tablet TAKE ONE TABLET BY MOUTH TWICE DAILY  04/28/15   Peter M SwazilandJordan, MD  metroNIDAZOLE (FLAGYL) 500 MG tablet Take 1 tablet (500 mg total) by mouth 4 (four) times daily. One po bid x 7 days 07/07/15   Bethann BerkshireJoseph Bebe Moncure, MD  Multiple Vitamin (MULTIVITAMIN) tablet Take 1 tablet by mouth daily.    Historical Provider, MD  Omega-3 Fatty Acids (FISH OIL PO) Take by mouth. Taking 4 daily    Historical Provider, MD  ramipril (ALTACE) 10 MG capsule Take 1 capsule (10 mg total) by mouth 2 (two) times daily. 04/28/15   Peter M [place]azilandJordan, MD  rosuvastatin (CRESTOR) 20 MG tablet Take 1 tablet  (20 mg total) by mouth daily. 04/28/15   Peter M [place]azilandJordan, MD   BP 127/59 mmHg  Pulse 74  Temp(Src) 98.3 F (36.8 C) (Oral)  Resp 16  SpO2 99% Physical Exam  Constitutional: She is oriented to person, place, and time. She appears well-developed.  HENT:  Head: Normocephalic.  Eyes: Conjunctivae and EOM are normal. No scleral icterus.  Neck: Neck supple. No thyromegaly present.  Cardiovascular: Normal rate and regular rhythm.  Exam reveals no gallop and no friction rub.   No murmur heard. Pulmonary/Chest: No stridor. She has no wheezes. She has no rales. She exhibits no tenderness.  Abdominal: She exhibits no distension. There is no tenderness. There is no rebound.  Musculoskeletal: Normal range of motion. She exhibits no edema.  Lymphadenopathy:    She has no cervical adenopathy.  Neurological: She is oriented to person, place, and time. She exhibits normal muscle tone. Coordination normal.  Skin: No rash noted. No erythema.  Psychiatric: She has a normal mood and affect. Her behavior is normal.    ED Course  Procedures (including critical care time) Labs Review Labs Reviewed  COMPREHENSIVE METABOLIC PANEL - Abnormal; Notable for the following:    Potassium 3.3 (*)    Glucose, Bld 157 (*)    BUN 24 (*)    Total Protein 6.1 (*)    Albumin 3.2 (*)    All other components within normal limits  CBC - Abnormal; Notable for the following:    WBC 15.9 (*)    RBC 3.48 (*)    Hemoglobin 9.4 (*)    HCT 29.3 (*)    All other components within normal limits  URINALYSIS, ROUTINE W REFLEX MICROSCOPIC (NOT AT [facility]) - Abnormal; Notable for the following:    APPearance CLOUDY (*)    Hgb urine dipstick TRACE (*)    Nitrite POSITIVE (*)    All other components within normal limits  CBC WITH DIFFERENTIAL/PLATELET - Abnormal; Notable for the following:    Neutro Abs 12.8 (*)    All other components within normal limits  URINE MICROSCOPIC-ADD ON - Abnormal; Notable for the following:     Squamous Epithelial / LPF 6-30 (*)    Bacteria, UA MANY (*)    All other components within normal limits  LIPASE, BLOOD    Imaging Review No results found. I have personally reviewed and evaluated these images and lab results as part of my medical decision-making.   EKG Interpretation None      MDM   Final diagnoses:  Colitis    Patient with vomiting and diarrhea. Suspect colitis. Patient will be put on Flagyl Cipro and will take Zofran at home she will follow-up with her doctor in 2-3 days    Bethann BerkshireJoseph Maksim Peregoy, MD 07/07/15 [phone]

## 2015-07-07 NOTE — ED Notes (Signed)
Pt states last week she felt sick and vomiting, went to her PCP and was diagnosed with gastroenteritis. Three/four days later she felt better. Pt family states Sunday morning she nearly passed out at church and vomiting. Poor appetite. Pt vomited and had multiple episodes of diarrhea. Pt denies abdominal pain. Pt ambulatory with steady gait, family states her PCP said she was dehydrated due to persistent vomiting and diarrhea. Pt AAOX4. Denies pain at this time.

## 2015-07-07 NOTE — Progress Notes (Signed)
07/07/2015 A. Estephania Licciardi RNCM 1722pm   EDCM [facility].  EDCM called [facility] and spoke to EDP Adela LankFloyd who recommends Flagyl 500mg  po BID for 7 days.  [facility] Herbert SetaHeather [facility] with this information.  No further EDCM needs at this time.

## 2015-07-07 NOTE — Discharge Instructions (Signed)
Drink plenty of fluids follow-up with your doctor in 2-3 days

## 2015-08-13 ENCOUNTER — Emergency Department (HOSPITAL_COMMUNITY): Payer: Medicare Other

## 2015-08-13 ENCOUNTER — Encounter (HOSPITAL_COMMUNITY): Payer: Self-pay | Admitting: Emergency Medicine

## 2015-08-13 ENCOUNTER — Emergency Department (HOSPITAL_COMMUNITY)
Admission: EM | Admit: 2015-08-13 | Discharge: 2015-08-13 | Disposition: A | Discharge status: Home / Self Care | Payer: Medicare Other | Attending: Emergency Medicine | Admitting: Emergency Medicine

## 2015-08-13 DIAGNOSIS — R55 Syncope and collapse: Secondary | ICD-10-CM

## 2015-08-13 DIAGNOSIS — I2510 Atherosclerotic heart disease of native coronary artery without angina pectoris: Secondary | ICD-10-CM | POA: Insufficient documentation

## 2015-08-13 DIAGNOSIS — Z951 Presence of aortocoronary bypass graft: Secondary | ICD-10-CM | POA: Insufficient documentation

## 2015-08-13 DIAGNOSIS — I10 Essential (primary) hypertension: Secondary | ICD-10-CM | POA: Diagnosis not present

## 2015-08-13 DIAGNOSIS — Z7984 Long term (current) use of oral hypoglycemic drugs: Secondary | ICD-10-CM | POA: Insufficient documentation

## 2015-08-13 DIAGNOSIS — E119 Type 2 diabetes mellitus without complications: Secondary | ICD-10-CM | POA: Diagnosis not present

## 2015-08-13 DIAGNOSIS — Z79899 Other long term (current) drug therapy: Secondary | ICD-10-CM | POA: Diagnosis not present

## 2015-08-13 DIAGNOSIS — R4182 Altered mental status, unspecified: Secondary | ICD-10-CM | POA: Diagnosis present

## 2015-08-13 DIAGNOSIS — Z7982 Long term (current) use of aspirin: Secondary | ICD-10-CM | POA: Insufficient documentation

## 2015-08-13 LAB — I-STAT CHEM 8, ED
BUN: 15 mg/dL (ref 6–20)
CALCIUM ION: 1.18 mmol/L (ref 1.12–1.23)
CHLORIDE: 100 mmol/L — AB (ref 101–111)
CREATININE: 0.6 mg/dL (ref 0.44–1.00)
GLUCOSE: 224 mg/dL — AB (ref 65–99)
HCT: 38 % (ref 36.0–46.0)
HEMOGLOBIN: 12.9 g/dL (ref 12.0–15.0)
POTASSIUM: 3.4 mmol/L — AB (ref 3.5–5.1)
Sodium: 140 mmol/L (ref 135–145)
TCO2: 24 mmol/L (ref 0–100)

## 2015-08-13 LAB — I-STAT CG4 LACTIC ACID, ED: Lactic Acid, Venous: 2.66 mmol/L (ref 0.5–1.9)

## 2015-08-13 LAB — URINALYSIS, ROUTINE W REFLEX MICROSCOPIC
Bilirubin Urine: NEGATIVE
GLUCOSE, UA: NEGATIVE mg/dL
HGB URINE DIPSTICK: NEGATIVE
Ketones, ur: NEGATIVE mg/dL
LEUKOCYTES UA: NEGATIVE
Nitrite: NEGATIVE
PROTEIN: NEGATIVE mg/dL
SPECIFIC GRAVITY, URINE: 1.016 (ref 1.005–1.030)
pH: 6.5 (ref 5.0–8.0)

## 2015-08-13 LAB — CBC WITH DIFFERENTIAL/PLATELET
Basophils Absolute: 0 10*3/uL (ref 0.0–0.1)
Basophils Relative: 0 %
Eosinophils Absolute: 0 10*3/uL (ref 0.0–0.7)
Eosinophils Relative: 0 %
HEMATOCRIT: 36.5 % (ref 36.0–46.0)
HEMOGLOBIN: 11.4 g/dL — AB (ref 12.0–15.0)
LYMPHS ABS: 0.8 10*3/uL (ref 0.7–4.0)
LYMPHS PCT: 8 %
MCH: 26.9 pg (ref 26.0–34.0)
MCHC: 31.2 g/dL (ref 30.0–36.0)
MCV: 86.1 fL (ref 78.0–100.0)
MONO ABS: 0.3 10*3/uL (ref 0.1–1.0)
MONOS PCT: 3 %
NEUTROS ABS: 8.4 10*3/uL — AB (ref 1.7–7.7)
NEUTROS PCT: 89 %
Platelets: 200 10*3/uL (ref 150–400)
RBC: 4.24 MIL/uL (ref 3.87–5.11)
RDW: 15.4 % (ref 11.5–15.5)
WBC: 9.5 10*3/uL (ref 4.0–10.5)

## 2015-08-13 LAB — I-STAT TROPONIN, ED: Troponin i, poc: 0 ng/mL (ref 0.00–0.08)

## 2015-08-13 LAB — RAPID URINE DRUG SCREEN, HOSP PERFORMED
AMPHETAMINES: NOT DETECTED
BENZODIAZEPINES: NOT DETECTED
Barbiturates: NOT DETECTED
COCAINE: NOT DETECTED
Opiates: NOT DETECTED
Tetrahydrocannabinol: NOT DETECTED

## 2015-08-13 LAB — BASIC METABOLIC PANEL
ANION GAP: 10 (ref 5–15)
BUN: 14 mg/dL (ref 6–20)
CHLORIDE: 101 mmol/L (ref 101–111)
CO2: 25 mmol/L (ref 22–32)
Calcium: 9.2 mg/dL (ref 8.9–10.3)
Creatinine, Ser: 0.72 mg/dL (ref 0.44–1.00)
GFR calc Af Amer: >60 mL/min (ref >60)
GLUCOSE: 222 mg/dL — AB (ref 65–99)
POTASSIUM: 3.3 mmol/L — AB (ref 3.5–5.1)
Sodium: 136 mmol/L (ref 135–145)

## 2015-08-13 LAB — PROTIME-INR
INR: 0.97
Prothrombin Time: 12.9 seconds (ref 11.4–15.2)

## 2015-08-13 LAB — ETHANOL

## 2015-08-13 LAB — CBG MONITORING, ED: Glucose-Capillary: 213 mg/dL — ABNORMAL HIGH (ref 65–99)

## 2015-08-13 LAB — MAGNESIUM: Magnesium: 1.8 mg/dL (ref 1.7–2.4)

## 2015-08-13 MED ORDER — POTASSIUM CHLORIDE CRYS ER 20 MEQ PO TBCR
40.0000 meq | EXTENDED_RELEASE_TABLET | Freq: Once | ORAL | Status: AC
  Administered 2015-08-13: 40 meq via ORAL
  Filled 2015-08-13: qty 2

## 2015-08-13 MED ORDER — SODIUM CHLORIDE 0.9 % IV BOLUS (SEPSIS)
1000.0000 mL | Freq: Once | INTRAVENOUS | Status: AC
  Administered 2015-08-13: 1000 mL via INTRAVENOUS

## 2015-08-13 NOTE — ED Notes (Signed)
Per husband   Pt woke him up at approx 345 am "with a  real loud snoring sound, which is uncommon for her." He attempted to wake her up and kept making a snoring sound through her mouth "Her tongue was sticking out and foaming at the mouth." Unresponsive to him he sat her up passed out stage. Shook her and kept calling her name. Husband states her "eyes rolled back in her head." He thought she was going to throw up. Husband denies body shaking during the time of all the other symptoms. "Breathing so heard it was like she was snorting."   "[employer] 99 mg of K per her MD request. 150 mg of potassium (self administration.) Natures own brand"   "On June 20 th she had diarrhea. She almost fell off the commode and her eyes rolled back while having uncontrollable diarrhea."

## 2015-08-13 NOTE — ED Provider Notes (Signed)
[facility]EMER[facility]ENCY DEPT Provider Note   CSN: [id number] Arrival date & time: 08/13/15  0530  First Provider Contact:  First MD Initiated Contact with Patient 08/13/15 0540        History   Chief Complaint Chief Complaint  Patient presents with  . Altered Mental Status    HPI [name] is a 75 y.o. female with a past medical history of diabetes, hypertension, hyperlipidemia presenting today with possible seizure. Patient did not give history due to the acuity of her condition. Per EMS and her husband, patient had a possible syncopal episode. She began foaming at the mouth and was not responsive. Husband denies any seizure-like shaking activity. Patient denies biting her tongue or any incontinence. EMS states patient was postictal on arrival. No medications were given. She has no formal history of seizure. This did occur approximately one month ago but it was in the setting of dehydration. No other history of syncope noted. Currently patient states she feels like she is back at her baseline, she denies any pain anywhere. She has not been sick recently, no fevers, vomiting or diarrhea. There are no further complaints.   10 Systems reviewed and are negative for acute change except as noted in the HPI.   HPI  Past Medical History:  Diagnosis Date  . Coronary artery disease   . Diabetes mellitus   . Hyperlipidemia   . Hypertension   . Ischemic cardiomyopathy    EF 35-40%  . Non-ST elevation MI (NSTEMI) (HCC) 09/2007   hx of  . Sepsis following intra-abdominal surgery (HCC)   . Status post CVA     Patient Active Problem List   Diagnosis Date Noted  . 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 Coronary artery disease   . Hypertension   . Hyperlipidemia   . Diabetes mellitus (HCC)   . Status post CVA   . Non-ST elevation MI (NSTEMI) [facility])     Past Surgical History:  Procedure Laterality Date  . CARDIAC CATHETERIZATION  04/08/2008  . CORONARY ARTERY BYPASS  GRAFT  2010   lima graft to lad,saphenous vein graft tothe diagonal,saphenous vein graft to the intermediate branch  and saphenous vein graft to the distal right coronary  . MITRAL VALVE REPAIR    . status post partial colectomy      OB History    No data available       Home Medications    Prior to Admission medications   Medication Sig Start Date End Date Taking? Authorizing Provider  amLODipine (NORVASC) 5 MG tablet Take 1 tablet (5 mg total) by mouth daily. 04/28/15  Yes Peter M Swaziland, MD  aspirin EC 81 MG tablet Take 1 tablet (81 mg total) by mouth daily. 04/28/15  Yes Peter M Swaziland, MD  glimepiride (AMARYL) 2 MG tablet Take 2 mg by mouth daily.  02/23/11  Yes Historical Provider, MD  hydrochlorothiazide (HYDRODIURIL) 25 MG tablet Take 1 tablet (25 mg total) by mouth daily. 04/28/15  Yes Peter M Swaziland, MD  IRON PO Take 65 mg by mouth daily.   Yes Historical Provider, MD  metoprolol (LOPRESSOR) 50 MG tablet TAKE ONE TABLET BY MOUTH TWICE DAILY 04/28/15  Yes Peter M Swaziland, MD  Multiple Vitamin (MULTIVITAMIN) tablet Take 1 tablet by mouth daily.   Yes Historical Provider, MD  ramipril (ALTACE) 10 MG capsule Take 1 capsule (10 mg total) by mouth 2 (two) times daily. 04/28/15  Yes Peter M Swaziland, MD  rosuvastatin (CRESTOR)  20 MG tablet Take 1 tablet (20 mg total) by mouth daily. 04/28/15  Yes Peter M Swaziland, MD  ciprofloxacin (CIPRO) 500 MG tablet Take 1 tablet (500 mg total) by mouth 2 (two) times daily. 07/07/15   Bethann Berkshire, MD  metroNIDAZOLE (FLAGYL) 500 MG tablet Take 1 tablet (500 mg total) by mouth 4 (four) times daily. One po bid x 7 days 07/07/15   Bethann Berkshire, MD  Omega-3 Fatty Acids (FISH OIL PO) Take by mouth. Taking 4 daily    Historical Provider, MD    Family History Family History  Problem Relation Age of Onset  . Hypertension Mother   . Melanoma Mother   . Heart disease Father     PPM    Social History Social History  Substance Use Topics  . Smoking status:  Never Smoker  . Smokeless tobacco: Never Used  . Alcohol use No     Allergies   Review of patient's allergies indicates no known allergies.   Review of Systems Review of Systems   Physical Exam Updated Vital Signs BP 135/62 (BP Location: Right Arm)   Pulse 80   Temp 97.6 F (36.4 C) (Oral)   Resp 17   Ht  (1.549 m)   SpO2 95%   Physical Exam  Constitutional: She is oriented to person, place, and time. She appears well-developed and well-nourished. No distress.  HENT:  Head: Normocephalic and atraumatic.  Nose: Nose normal.  Mouth/Throat: Oropharynx is clear and moist. No oropharyngeal exudate.  Eyes: Conjunctivae and EOM are normal. Pupils are equal, round, and reactive to light. No scleral icterus.  Neck: Normal range of motion. Neck supple. No JVD present. No tracheal deviation present. No thyromegaly present.  Cardiovascular: Normal rate, regular rhythm and normal heart sounds.  Exam reveals no gallop and no friction rub.   No murmur heard. Pulmonary/Chest: Effort normal and breath sounds normal. No respiratory distress. She has no wheezes. She exhibits no tenderness.  Abdominal: Soft. Bowel sounds are normal. She exhibits no distension and no mass. There is no tenderness. There is no rebound and no guarding.  Musculoskeletal: Normal range of motion. She exhibits no edema or tenderness.  Lymphadenopathy:    She has no cervical adenopathy.  Neurological: She is alert and oriented to person, place, and time. No cranial nerve deficit. She exhibits normal muscle tone.  Normal strength and sensation in all instruments. Normal cerebellar testing.  Skin: Skin is warm and dry. No rash noted. No erythema. No pallor.  Nursing note and vitals reviewed.    ED Treatments / Results  Labs (all labs ordered are listed, but only abnormal results are displayed) Labs Reviewed  CBC WITH DIFFERENTIAL/PLATELET - Abnormal; Notable for the following:       Result Value    Hemoglobin 11.4 (*)    Neutro Abs 8.4 (*)    All other components within normal limits  BASIC METABOLIC PANEL - Abnormal; Notable for the following:    Potassium 3.3 (*)    Glucose, Bld 222 (*)    All other components within normal limits  CBG MONITORING, ED - Abnormal; Notable for the following:    Glucose-Capillary 213 (*)    All other components within normal limits  I-STAT CHEM 8, ED - Abnormal; Notable for the following:    Potassium 3.4 (*)    Chloride 100 (*)    Glucose, Bld 224 (*)    All other components within normal limits  I-STAT CG4 LACTIC ACID, ED -  Abnormal; Notable for the following:    Lactic Acid, Venous 2.66 (*)    All other components within normal limits  URINE CULTURE  PROTIME-INR  URINALYSIS, ROUTINE W REFLEX MICROSCOPIC (NOT AT [facility])  ETHANOL  MAGNESIUM  URINE RAPID DRUG SCREEN, HOSP PERFORMED  CBG MONITORING, ED  I-STAT TROPOININ, ED    EKG  EKG Interpretation  Date/Time:  Thursday August 13 2015 05:37:36 EDT Ventricular Rate:  81 PR Interval:    QRS Duration: 100 QT Interval:  419 QTC Calculation: 487 R Axis:   -77 Text Interpretation:  Sinus rhythm Ventricular premature complex Left anterior fascicular block Anteroseptal infarct, old No significant change since last tracing Confirmed by Erroll Luna [phone]) on 08/13/2015 6:06:25 AM       Radiology Dg Chest 2 View  Result Date: 08/13/2015 CLINICAL DATA:  75 year old female with syncope EXAM: CHEST  2 VIEW COMPARISON:  Chest radiograph dated 05/12/2008 FINDINGS: Stable cardiomegaly with mild congestive changes. Median sternotomy wires, CABG clips, and mitral valve annuloplasty noted. There is no focal consolidation, pleural effusion, or pneumothorax. No acute osseous pathology. IMPRESSION: Cardiomegaly with mild congestive changes.  No focal consolidation. Electronically Signed   By: Elgie Collard M.D.   On: 08/13/2015 06:42  Ct Head Wo Contrast  Result Date: 08/13/2015 CLINICAL  DATA:  75 year old female with seizures and syncope EXAM: CT HEAD WITHOUT CONTRAST TECHNIQUE: Contiguous axial images were obtained from the base of the skull through the vertex without intravenous contrast. COMPARISON:  Brain MRI dated 11/09/2007 FINDINGS: There is mild age-related atrophy and chronic microvascular ischemic changes. Focal area of old infarct and encephalomalacia noted in the right cerebellum in the region of the right PICA territory. There is no acute intracranial hemorrhage. No mass effect or midline shift noted. Bilateral maxillary sinus retention cysts or polyps. The visualized paranasal sinuses and mastoid air cells are otherwise clear. The calvarium is intact. IMPRESSION: No acute intracranial hemorrhage. Mild age-related atrophy and chronic microvascular ischemic disease. Old right cerebellar infarct. If symptoms persist and there are no contraindications, MRI may provide better evaluation if clinically indicated Electronically Signed   By: Elgie Collard M.D.   On: 08/13/2015 06:41   Procedures Procedures (including critical care time)  Medications Ordered in ED Medications  potassium chloride SA (K-DUR,KLOR-CON) CR tablet 40 mEq (not administered)  sodium chloride 0.9 % bolus 1,000 mL (1,000 mLs Intravenous New Bag/Given 08/13/15 0558)     Initial Impression / Assessment and Plan / ED Course  I have reviewed the triage vital signs and the nursing notes.  Pertinent labs & imaging results that were available during my care of the patient were reviewed by me and considered in my medical decision making (see chart for details).  Clinical Course    Patient presents to the ED for possible syncope vs seizure.  Currently patient feels normal and her exam is normal.  Will work up like first time seizure and obtain CT head.  Also lab work up with infectious work up as well.  Potassium was replaced.  EKG does not show any cause for syncope.  Will continue to closely  monitor.  7:26 AM infectious workup negative.  Plan t DC home with PCP fu.  No further syncope here. VS remain within her normal limits and she is safe for DC.  Final Clinical Impressions(s) / ED Diagnoses   Final diagnoses:  None    New Prescriptions New Prescriptions   No medications on file     Tipton Ballow  Mora Bellman, MD 08/13/15 0730

## 2015-08-14 LAB — URINE CULTURE: CULTURE: NO GROWTH

## 2015-08-18 ENCOUNTER — Other Ambulatory Visit: Payer: Self-pay | Admitting: Family Medicine

## 2015-08-18 DIAGNOSIS — T671XXS Heat syncope, sequela: Secondary | ICD-10-CM

## 2015-08-20 ENCOUNTER — Ambulatory Visit (INDEPENDENT_AMBULATORY_CARE_PROVIDER_SITE_OTHER): Payer: Medicare Other | Admitting: Nurse Practitioner

## 2015-08-20 ENCOUNTER — Encounter (INDEPENDENT_AMBULATORY_CARE_PROVIDER_SITE_OTHER): Payer: Medicare Other

## 2015-08-20 ENCOUNTER — Encounter: Payer: Self-pay | Admitting: Nurse Practitioner

## 2015-08-20 VITALS — BP 128/68 | HR 65 | Ht 61.0 in | Wt 155.8 lb

## 2015-08-20 DIAGNOSIS — I255 Ischemic cardiomyopathy: Secondary | ICD-10-CM | POA: Insufficient documentation

## 2015-08-20 DIAGNOSIS — I25810 Atherosclerosis of coronary artery bypass graft(s) without angina pectoris: Secondary | ICD-10-CM

## 2015-08-20 DIAGNOSIS — R55 Syncope and collapse: Secondary | ICD-10-CM

## 2015-08-20 DIAGNOSIS — Z9889 Other specified postprocedural states: Secondary | ICD-10-CM

## 2015-08-20 DIAGNOSIS — I119 Hypertensive heart disease without heart failure: Secondary | ICD-10-CM

## 2015-08-20 DIAGNOSIS — I340 Nonrheumatic mitral (valve) insufficiency: Secondary | ICD-10-CM | POA: Insufficient documentation

## 2015-08-20 DIAGNOSIS — R569 Unspecified convulsions: Secondary | ICD-10-CM | POA: Insufficient documentation

## 2015-08-20 NOTE — Progress Notes (Signed)
Office Visit    Patient Name: [name] Date of Encounter: 08/20/2015  Primary Care Provider:  Lilia Argue Primary Cardiologist:  P. Swaziland, MD   Chief Complaint     75 year old female with a history of CAD status post CABG and mitral valve repair, who presents after recent ER visit for syncope versus seizure.  Past Medical History    Past Medical History:  Diagnosis Date  . Coronary artery disease    a. 09/2007 s/p NSTEMI;  b. 04/2008 s/p CABG x 4 (LIMA->LAD, VG->D1, VG->RI, VG->dRCA).  . Diabetes mellitus   . Hyperlipidemia   . Hypertension   . Ischemic cardiomyopathy    a. 2010 EF 35-40%;  b. 09/2009 EF 65-70%.  . Mitral regurgitation    a.04/2008 s/p MVR @ time of CABG w/ a 30mm Edwards Physio II annuloplasty ring (model # [id number], ser # [id number]).  . Non-ST elevation MI (NSTEMI) (HCC) 09/2007   hx of  . PFO (patent foramen ovale)    a. 04/2008 s/p PFO closure @ time of CABG.  . Seizure (HCC)    a. 07/2014 ED visit for seizure vs syncope.  . Sepsis following intra-abdominal surgery (HCC)   . Status post CVA    Past Surgical History:  Procedure Laterality Date  . CARDIAC CATHETERIZATION  04/08/2008  . CORONARY ARTERY BYPASS GRAFT  2010   lima graft to lad,saphenous vein graft tothe diagonal,saphenous vein graft to the intermediate branch  and saphenous vein graft to the distal right coronary  . MITRAL VALVE REPAIR    . status post partial colectomy      Allergies  No Known Allergies  History of Present Illness    75 year old female with the above, past medical history. She has a history of coronary artery disease and is status post coronary artery bypass grafting in April 2010. She also underwent mitral valve repair and closure of a patent foramen ovale at that time. She also has a history of hypertension, hyperlipidemia, diabetes, and ischemic cardiopathy was subsequently normalization of LV function. She was last seen in clinic a few months ago and was  doing well with the plan to follow-up in one year, however one week ago, her husband awoke from sleep and noted that she was making loud gurgling type sounds. When he turned on the light, her eyes were closed and tongue was hanging out of her mouth. She was not responsive. He called EMS. She remained unresponsive for approximately 20-25 minutes but by the time EMS arrived, she was more responsive though somewhat foggy. Her blood sugar was apparently in the 160s. She was also apparently hemodynamically stable. She was taken to the [place] where she was asymptomatic. Head CT was negative for acute findings and ECG and lab work were nonacute. She was subsequently discharged home. She since followed up with primary care and is scheduled for a carotid ultrasound next week. Cardiology follow-up was established to assist in the evaluation for syncope versus seizure. She has had no recurrence of symptoms and denies chest pain, dyspnea, PND, orthopnea, dizziness, or early satiety. Of note, her husband points out that in June, she had a syncopal episode while sitting in church, during which time she was unresponsive for a few minutes. Patient doesn't have any significant recollection of the events surrounding that event.  Home Medications    Prior to Admission medications   Medication Sig Start Date End Date Taking? Authorizing Provider  amLODipine (NORVASC) 5 MG tablet Take  1 tablet (5 mg total) by mouth daily. 04/28/15  Yes Peter M Swaziland, MD  aspirin EC 81 MG tablet Take 1 tablet (81 mg total) by mouth daily. 04/28/15  Yes Peter M Swaziland, MD  glimepiride (AMARYL) 2 MG tablet Take 2 mg by mouth daily.  02/23/11  Yes Historical Provider, MD  hydrochlorothiazide (HYDRODIURIL) 25 MG tablet Take 1 tablet (25 mg total) by mouth daily. 04/28/15  Yes Peter M Swaziland, MD  IRON PO Take 65 mg by mouth daily.   Yes Historical Provider, MD  metoprolol (LOPRESSOR) 50 MG tablet TAKE ONE TABLET BY MOUTH TWICE DAILY 04/28/15  Yes Peter  M Swaziland, MD  Multiple Vitamin (MULTIVITAMIN) tablet Take 1 tablet by mouth daily.   Yes Historical Provider, MD  Omega-3 Fatty Acids (FISH OIL PO) Take by mouth. Taking 4 daily   Yes Historical Provider, MD  ramipril (ALTACE) 10 MG capsule Take 1 capsule (10 mg total) by mouth 2 (two) times daily. 04/28/15  Yes Peter M Swaziland, MD  rosuvastatin (CRESTOR) 20 MG tablet Take 1 tablet (20 mg total) by mouth daily. 04/28/15  Yes Peter M Swaziland, MD    Review of Systems    Altered mental status with unresponsiveness as outlined above occurring about a week ago and prompting ER visit.  She denies chest pain, palpitations, dyspnea, pnd, orthopnea, n, v, dizziness, edema, weight gain, or early satiety.   All other systems reviewed and are otherwise negative except as noted above.  Physical Exam    VS:  BP 128/68   Pulse 65   Ht 5\' 1"  (1.549 m)   Wt 155 lb 12.8 oz (70.7 kg)   BMI 29.44 kg/m  , BMI Body mass index is 29.44 kg/m. GEN: Well nourished, well developed, in no acute distress.  HEENT: normal.  Neck: Supple, no JVD, carotid bruits, or masses. Cardiac: RRR, no murmurs, rubs, or gallops. No clubbing, cyanosis, edema.  Radials/DP/PT 2+ and equal bilaterally.  Respiratory:  Respirations regular and unlabored, clear to auscultation bilaterally. GI: Soft, nontender, nondistended, BS + x 4. MS: no deformity or atrophy. Skin: warm and dry, no rash. Neuro:  Strength and sensation are intact. Psych: Normal affect.  Accessory Clinical Findings    ECG - Regular sinus rhythm, 65, left axis deviation, delayed R-wave progression-no acute ST or T changes.  Assessment & Plan    1.  Syncope versus seizure: Patient was recently evaluated in the [place] secondary to altered mental status and unresponsiveness over about a 25 minute period.  Her husband says that though she was unresponsive, she was breathing. She was hemodynamically stable and evaluate by EMS but by that time, she was also more  responsive. There were no acute findings on labs, ECG, or CT of the head in the ER. Her husband also points out that she had a syncopal spell while at church in June. ECG is without acute findings today. Given her prior history, I will follow-up a 2-D echocardiogram and also place a 30 day event monitor to rule out possible cardiogenic causes of loss of consciousness.  2. Coronary artery disease status post coronary artery bypass grafting: She has not been having any chest pain or significant dyspnea. She remains on aspirin, beta blocker, and statin therapy. Follow-up echo as above and consider ischemic evaluation if LV function abnormal.  3. Hypertensive heart disease: Blood pressure is stable on beta blocker, diuretic, and calcium channel blocker therapy.  4. Hyperlipidemia: She remains on Crestor therapy. This  is followed by primary care.  5. Status post mitral valve repair: She denies any dyspnea. Follow-up echo as above.  6. Type 2 diabetes mellitus: Followed closely by primary care. She remains on Amaryl.  7. Disposition: Follow-up echo and event monitor. Follow-up with Dr. Swaziland in approximately 2 months or sooner if necessary.  Nicolasa Ducking, NP 08/20/2015, 12:57 PM

## 2015-08-20 NOTE — Patient Instructions (Signed)
Medication Instructions: Ward Givens, NP, recommends that you continue on your current medications as directed. Please refer to the Current Medication list given to you today.  Labwork: NONE ORDERED  Testing/Procedures: 1. Echocardiogram - Your physician has requested that you have an echocardiogram. Echocardiography is a painless test that uses sound waves to create images of your heart. It provides your doctor with information about the size and shape of your heart and how well your heart's chambers and valves are working. This procedure takes approximately one hour. There are no restrictions for this procedure. This will be done at our [facility] location - [address], [address].  2. 30-Day Cardiac Event Monitor - Your physician has recommended that you wear an event monitor. Event monitors are medical devices that record the heart's electrical activity. Doctors most often [place] these monitors to diagnose arrhythmias. Arrhythmias are problems with the speed or rhythm of the heartbeat. The monitor is a small, portable device. You can wear one while you do your normal daily activities. This is usually used to diagnose what is causing palpitations/syncope (passing out).  Follow-up: Thayer Ohm recommends that you schedule a follow-up appointment in 2 months with Dr Swaziland.  If you need a refill on your cardiac medications before your next appointment, please call your pharmacy.

## 2015-08-24 ENCOUNTER — Ambulatory Visit
Admission: RE | Admit: 2015-08-24 | Discharge: 2015-08-24 | Disposition: A | Discharge status: Home / Self Care | Payer: Medicare Other | Source: Ambulatory Visit | Attending: Family Medicine | Admitting: Family Medicine

## 2015-08-24 DIAGNOSIS — T671XXS Heat syncope, sequela: Secondary | ICD-10-CM

## 2015-08-26 ENCOUNTER — Telehealth: Payer: Self-pay | Admitting: Cardiology

## 2015-08-26 NOTE — Telephone Encounter (Signed)
New message   Pt husband verbalized that he is calling to see how much his wife's co-pay will be for her Echo on 09/01/2015

## 2015-08-27 ENCOUNTER — Other Ambulatory Visit: Payer: Self-pay | Admitting: Family Medicine

## 2015-08-27 DIAGNOSIS — R55 Syncope and collapse: Secondary | ICD-10-CM

## 2015-08-27 DIAGNOSIS — I25709 Atherosclerosis of coronary artery bypass graft(s), unspecified, with unspecified angina pectoris: Secondary | ICD-10-CM

## 2015-09-01 ENCOUNTER — Ambulatory Visit (HOSPITAL_COMMUNITY): Payer: Medicare Other | Attending: Cardiovascular Disease

## 2015-09-01 ENCOUNTER — Other Ambulatory Visit: Payer: Self-pay

## 2015-09-01 DIAGNOSIS — E785 Hyperlipidemia, unspecified: Secondary | ICD-10-CM | POA: Insufficient documentation

## 2015-09-01 DIAGNOSIS — I340 Nonrheumatic mitral (valve) insufficiency: Secondary | ICD-10-CM | POA: Diagnosis not present

## 2015-09-01 DIAGNOSIS — E119 Type 2 diabetes mellitus without complications: Secondary | ICD-10-CM | POA: Diagnosis not present

## 2015-09-01 DIAGNOSIS — I252 Old myocardial infarction: Secondary | ICD-10-CM | POA: Insufficient documentation

## 2015-09-01 DIAGNOSIS — I255 Ischemic cardiomyopathy: Secondary | ICD-10-CM | POA: Insufficient documentation

## 2015-09-01 DIAGNOSIS — R55 Syncope and collapse: Secondary | ICD-10-CM | POA: Diagnosis not present

## 2015-09-01 DIAGNOSIS — Z951 Presence of aortocoronary bypass graft: Secondary | ICD-10-CM | POA: Diagnosis not present

## 2015-09-01 DIAGNOSIS — I10 Essential (primary) hypertension: Secondary | ICD-10-CM | POA: Diagnosis not present

## 2015-12-15 ENCOUNTER — Encounter: Payer: Self-pay | Admitting: Cardiology

## 2015-12-20 NOTE — Progress Notes (Signed)
[name] Date of Birth: [birth year] Medical Record #[id number]  History of Present Illness: [name] is seen for  followup CAD. She has a history of coronary disease and is status post coronary bypass surgery in 2010. Prior to her bypass surgery her ejection fraction was 35%. Repeat echocardiogram in September of 2011 showed a normal ejection fraction of 65-70%. In July 2017 she was admitted with an episode of unresponsiveness. Her husband awoke from sleep and noted that she was making loud gurgling type sounds. When he turned on the light, her eyes were closed and tongue was hanging out of her mouth. She was not responsive. He called EMS. She remained unresponsive for approximately 20-25 minutes but by the time EMS arrived, she was more responsive though somewhat foggy. Her blood sugar was apparently in the 160s. She was also apparently hemodynamically stable. She was taken to the [facility] where she was asymptomatic. Head CT was negative for acute findings and ECG and lab work were nonacute. She was subsequently discharged home. Carotid dopplers and Echo were unremarkable. Event monitor was normal.   On follow up today she reports that her husband is concerned that she stops breathing at night and then catches up. Some snoring. She denies excessive somnolence or fatigue. She is active "going all the time"   Current Outpatient Prescriptions on File Prior to Visit  Medication Sig Dispense Refill  . amLODipine (NORVASC) 5 MG tablet Take 1 tablet (5 mg total) by mouth daily. 30 tablet 11  . aspirin EC 81 MG tablet Take 1 tablet (81 mg total) by mouth daily.    Marland Kitchen. glimepiride (AMARYL) 2 MG tablet Take 2 mg by mouth daily.     . hydrochlorothiazide (HYDRODIURIL) 25 MG tablet Take 1 tablet (25 mg total) by mouth daily. 30 tablet 11  . IRON PO Take 65 mg by mouth daily.    . metoprolol (LOPRESSOR) 50 MG tablet TAKE ONE TABLET BY MOUTH TWICE DAILY 60 tablet 11  . Multiple Vitamin  (MULTIVITAMIN) tablet Take 1 tablet by mouth daily.    . Omega-3 Fatty Acids (FISH OIL PO) Take by mouth. Taking 4 daily    . ramipril (ALTACE) 10 MG capsule Take 1 capsule (10 mg total) by mouth 2 (two) times daily. 60 capsule 11  . rosuvastatin (CRESTOR) 20 MG tablet Take 1 tablet (20 mg total) by mouth daily. 30 tablet 11   No current facility-administered medications on file prior to visit.     No Known Allergies  Past Medical History:  Diagnosis Date  . Coronary artery disease    a. 09/2007 s/p NSTEMI;  b. 04/2008 s/p CABG x 4 (LIMA->LAD, VG->D1, VG->RI, VG->dRCA).  . Diabetes mellitus   . Hyperlipidemia   . Hypertension   . Ischemic cardiomyopathy    a. 2010 EF 35-40%;  b. 09/2009 EF 65-70%.  . Mitral regurgitation    a.04/2008 s/p MVR @ time of CABG w/ a 30mm Edwards Physio II annuloplasty ring (model # [id number], ser # [id number]).  . Non-ST elevation MI (NSTEMI) (HCC) 09/2007   hx of  . PFO (patent foramen ovale)    a. 04/2008 s/p PFO closure @ time of CABG.  . Seizure (HCC)    a. 07/2014 ED visit for seizure vs syncope.  . Sepsis following intra-abdominal surgery (HCC)   . Status post CVA     Past Surgical History:  Procedure Laterality Date  . CARDIAC CATHETERIZATION  04/08/2008  . CORONARY ARTERY BYPASS GRAFT  2010   lima graft to lad,saphenous vein graft tothe diagonal,saphenous vein graft to the intermediate branch  and saphenous vein graft to the distal right coronary  . MITRAL VALVE REPAIR    . status post partial colectomy      History  Smoking Status  . Never Smoker  Smokeless Tobacco  . Never Used    History  Alcohol Use No    Family History  Problem Relation Age of Onset  . Hypertension Mother   . Melanoma Mother   . Heart disease Father     PPM    Review of Systems: As noted in history of present illness.  All other systems were reviewed and are negative.  Physical Exam: BP 140/70 (BP Location: Right Arm, Patient Position: Sitting, Cuff Size:  Normal)   Pulse 72   Ht 5\' 1"  (1.549 m)   Wt 159 lb 3.2 oz (72.2 kg)   SpO2 98%   BMI 30.08 kg/m  She is a pleasant white female in no acute distress. Her HEENT exam is unremarkable.  Neck is without JVD, adenopathy, thyromegaly, or bruits. Lungs are clear. Cardiac exam reveals a regular rate and rhythm without gallop or murmur. Abdomen is soft and nontender without masses or bruits. She has good femoral and pedal pulses. She has no edema today. Skin is warm and dry. She is alert and oriented x3. Cranial nerves II through XII are intact.  LABORATORY DATA:  Lab Results  Component Value Date   WBC 9.5 08/13/2015   HGB 11.4 (L) 08/13/2015   HCT 36.5 08/13/2015   PLT 200 08/13/2015   GLUCOSE 222 (H) 08/13/2015   CHOL (H) 11/09/2007    201        ATP III CLASSIFICATION:  <200     mg/dL   Desirable  [id number]  mg/dL   Borderline High  >=914>=240    mg/dL   High   TRIG 782231 (H) 95/62/130810/23/2009   HDL 14 (L) 11/09/2007   LDLCALC (H) 11/09/2007    141        Total Cholesterol/HDL:CHD Risk Coronary Heart Disease Risk Table                     Men   Women  1/2 Average Risk   3.4   3.3   ALT 20 07/07/2015   AST 23 07/07/2015   NA 136 08/13/2015   K 3.3 (L) 08/13/2015   CL 101 08/13/2015   CREATININE 0.72 08/13/2015   BUN 14 08/13/2015   CO2 25 08/13/2015   TSH 5.471 Test methodology is 3rd generation TSH(H) 11/15/2007   INR 0.97 08/13/2015   HGBA1C (H) 04/17/2008    6.3 (NOTE)   The ADA recommends the following therapeutic goal for glycemic   control related to Hgb A1C measurement:   Goal of Therapy:   < 7.0% Hgb A1C   Reference: American Diabetes Association: Clinical Practice   Recommendations 2008, Diabetes Care,  2008, 31:(Suppl 1).    Echo: 09/01/15:Study Conclusions  - Left ventricle: The cavity size was normal. Wall thickness was   normal. Systolic function was normal. The estimated ejection   fraction was in the range of 60% to 65%. Wall motion was normal;   there were no regional  wall motion abnormalities. Left   ventricular diastolic function parameters were normal. - Mitral valve: Prior procedures included surgical repair. The   findings are consistent with mild stenosis. There was mild   regurgitation. - Pulmonary  arteries: Systolic pressure was mildly increased. PA   peak pressure: 32 mm Hg (S).  Assessment / Plan: 1. Coronary disease status post CABG in 2010. She remains asymptomatic. Last ejection fraction was normal. We will continue on aspirin, metoprolol, and statin therapy.   2. Episode of unresponsiveness. Evaluation benign. Event monitor showed no arrhythmia. ? Seizure. Echo normal. ? Is she has sleep apnea with witnessed apnea spells. Will arrange overnight oximetry to screen for sleep apnea.  3. Hypertension. Blood pressure is well controlled.  I recommend continuing current HCTZ, ramipril, amlodipine, and lopressor.   4. Hypercholesterolemia on chronic statin therapy.   I will follow up in 6 months

## 2015-12-22 ENCOUNTER — Telehealth: Payer: Self-pay

## 2015-12-22 ENCOUNTER — Encounter: Payer: Self-pay | Admitting: Cardiology

## 2015-12-22 ENCOUNTER — Ambulatory Visit (INDEPENDENT_AMBULATORY_CARE_PROVIDER_SITE_OTHER): Payer: Medicare Other | Admitting: Cardiology

## 2015-12-22 VITALS — BP 140/70 | HR 72 | Ht 61.0 in | Wt 159.2 lb

## 2015-12-22 DIAGNOSIS — I25810 Atherosclerosis of coronary artery bypass graft(s) without angina pectoris: Secondary | ICD-10-CM | POA: Diagnosis not present

## 2015-12-22 DIAGNOSIS — I10 Essential (primary) hypertension: Secondary | ICD-10-CM

## 2015-12-22 DIAGNOSIS — E119 Type 2 diabetes mellitus without complications: Secondary | ICD-10-CM

## 2015-12-22 DIAGNOSIS — E7800 Pure hypercholesterolemia, unspecified: Secondary | ICD-10-CM

## 2015-12-22 DIAGNOSIS — Z951 Presence of aortocoronary bypass graft: Secondary | ICD-10-CM

## 2015-12-22 MED ORDER — HYDROCHLOROTHIAZIDE 25 MG PO TABS: 25.0000 mg | ORAL_TABLET | Freq: Every day | ORAL | 11 refills | Status: DC

## 2015-12-22 MED ORDER — METOPROLOL TARTRATE 50 MG PO TABS: ORAL_TABLET | ORAL | 11 refills | Status: DC

## 2015-12-22 MED ORDER — ROSUVASTATIN CALCIUM 20 MG PO TABS: 20.0000 mg | ORAL_TABLET | Freq: Every day | ORAL | 11 refills | Status: DC

## 2015-12-22 MED ORDER — RAMIPRIL 10 MG PO CAPS: 10.0000 mg | ORAL_CAPSULE | Freq: Two times a day (BID) | ORAL | 11 refills | Status: DC

## 2015-12-22 MED ORDER — AMLODIPINE BESYLATE 5 MG PO TABS: 5.0000 mg | ORAL_TABLET | Freq: Every day | ORAL | 11 refills | Status: DC

## 2015-12-22 NOTE — Patient Instructions (Signed)
Continue your current medication   We will arrange overnight oximetry to screen for sleep apnea.  I will see you in 6 months.

## 2015-12-22 NOTE — Telephone Encounter (Signed)
Order for overnight oximetry faxed to [facility] at fax # [phone].

## 2015-12-30 ENCOUNTER — Encounter: Payer: Self-pay | Admitting: Cardiology

## 2016-01-01 ENCOUNTER — Telehealth: Payer: Self-pay | Admitting: Cardiology

## 2016-01-01 NOTE — Telephone Encounter (Signed)
New message    Pt husband is calling for results for lab

## 2016-01-01 NOTE — Telephone Encounter (Signed)
Returned call to patient.Dr.Jordan reviewed over night oximetry done 12/29/15, you do not require O2 at night.

## 2016-02-05 ENCOUNTER — Observation Stay (HOSPITAL_COMMUNITY)
Admission: EM | Admit: 2016-02-05 | Discharge: 2016-02-07 | Disposition: A | Discharge status: Home / Self Care | Payer: Medicare Other | Attending: Internal Medicine | Admitting: Internal Medicine

## 2016-02-05 ENCOUNTER — Encounter (HOSPITAL_COMMUNITY): Payer: Self-pay | Admitting: Emergency Medicine

## 2016-02-05 ENCOUNTER — Observation Stay (HOSPITAL_COMMUNITY): Payer: Medicare Other

## 2016-02-05 ENCOUNTER — Emergency Department (HOSPITAL_COMMUNITY): Payer: Medicare Other

## 2016-02-05 DIAGNOSIS — I272 Pulmonary hypertension, unspecified: Secondary | ICD-10-CM | POA: Insufficient documentation

## 2016-02-05 DIAGNOSIS — J320 Chronic maxillary sinusitis: Secondary | ICD-10-CM | POA: Diagnosis not present

## 2016-02-05 DIAGNOSIS — Z951 Presence of aortocoronary bypass graft: Secondary | ICD-10-CM | POA: Diagnosis not present

## 2016-02-05 DIAGNOSIS — I4510 Unspecified right bundle-branch block: Secondary | ICD-10-CM | POA: Diagnosis not present

## 2016-02-05 DIAGNOSIS — D649 Anemia, unspecified: Secondary | ICD-10-CM | POA: Diagnosis present

## 2016-02-05 DIAGNOSIS — M40204 Unspecified kyphosis, thoracic region: Secondary | ICD-10-CM | POA: Diagnosis not present

## 2016-02-05 DIAGNOSIS — R55 Syncope and collapse: Secondary | ICD-10-CM | POA: Insufficient documentation

## 2016-02-05 DIAGNOSIS — E119 Type 2 diabetes mellitus without complications: Secondary | ICD-10-CM

## 2016-02-05 DIAGNOSIS — I255 Ischemic cardiomyopathy: Secondary | ICD-10-CM | POA: Diagnosis not present

## 2016-02-05 DIAGNOSIS — I700 Atherosclerosis of aorta: Secondary | ICD-10-CM | POA: Insufficient documentation

## 2016-02-05 DIAGNOSIS — I050 Rheumatic mitral stenosis: Secondary | ICD-10-CM | POA: Insufficient documentation

## 2016-02-05 DIAGNOSIS — E876 Hypokalemia: Secondary | ICD-10-CM | POA: Insufficient documentation

## 2016-02-05 DIAGNOSIS — Z8673 Personal history of transient ischemic attack (TIA), and cerebral infarction without residual deficits: Secondary | ICD-10-CM | POA: Diagnosis not present

## 2016-02-05 DIAGNOSIS — Z7982 Long term (current) use of aspirin: Secondary | ICD-10-CM | POA: Insufficient documentation

## 2016-02-05 DIAGNOSIS — R569 Unspecified convulsions: Secondary | ICD-10-CM | POA: Diagnosis not present

## 2016-02-05 DIAGNOSIS — I252 Old myocardial infarction: Secondary | ICD-10-CM | POA: Insufficient documentation

## 2016-02-05 DIAGNOSIS — I10 Essential (primary) hypertension: Secondary | ICD-10-CM | POA: Diagnosis not present

## 2016-02-05 DIAGNOSIS — F419 Anxiety disorder, unspecified: Secondary | ICD-10-CM | POA: Diagnosis not present

## 2016-02-05 DIAGNOSIS — Z952 Presence of prosthetic heart valve: Secondary | ICD-10-CM | POA: Diagnosis not present

## 2016-02-05 DIAGNOSIS — E785 Hyperlipidemia, unspecified: Secondary | ICD-10-CM | POA: Diagnosis not present

## 2016-02-05 DIAGNOSIS — Z9889 Other specified postprocedural states: Secondary | ICD-10-CM | POA: Diagnosis not present

## 2016-02-05 DIAGNOSIS — I340 Nonrheumatic mitral (valve) insufficiency: Secondary | ICD-10-CM | POA: Diagnosis not present

## 2016-02-05 DIAGNOSIS — I2510 Atherosclerotic heart disease of native coronary artery without angina pectoris: Secondary | ICD-10-CM | POA: Diagnosis not present

## 2016-02-05 LAB — CBC WITH DIFFERENTIAL/PLATELET
BASOS ABS: 0 10*3/uL (ref 0.0–0.1)
BASOS PCT: 0 %
Eosinophils Absolute: 0 10*3/uL (ref 0.0–0.7)
Eosinophils Relative: 0 %
HCT: 23.9 % — ABNORMAL LOW (ref 36.0–46.0)
Hemoglobin: 7.6 g/dL — ABNORMAL LOW (ref 12.0–15.0)
Lymphocytes Relative: 10 %
Lymphs Abs: 0.5 10*3/uL — ABNORMAL LOW (ref 0.7–4.0)
MCH: 27.1 pg (ref 26.0–34.0)
MCHC: 31.8 g/dL (ref 30.0–36.0)
MCV: 85.4 fL (ref 78.0–100.0)
MONO ABS: 0.3 10*3/uL (ref 0.1–1.0)
Monocytes Relative: 6 %
Neutro Abs: 4.4 10*3/uL (ref 1.7–7.7)
Neutrophils Relative %: 84 %
PLATELETS: 91 10*3/uL — AB (ref 150–400)
RBC: 2.8 MIL/uL — ABNORMAL LOW (ref 3.87–5.11)
RDW: 14.9 % (ref 11.5–15.5)
WBC: 5.3 10*3/uL (ref 4.0–10.5)

## 2016-02-05 LAB — URINALYSIS, ROUTINE W REFLEX MICROSCOPIC
BILIRUBIN URINE: NEGATIVE
Glucose, UA: NEGATIVE mg/dL
Hgb urine dipstick: NEGATIVE
KETONES UR: NEGATIVE mg/dL
NITRITE: NEGATIVE
PH: 5 (ref 5.0–8.0)
Protein, ur: NEGATIVE mg/dL
Specific Gravity, Urine: 1.015 (ref 1.005–1.030)

## 2016-02-05 LAB — I-STAT CHEM 8, ED
BUN: 27 mg/dL — ABNORMAL HIGH (ref 6–20)
CALCIUM ION: 1.16 mmol/L (ref 1.15–1.40)
Chloride: 103 mmol/L (ref 101–111)
Creatinine, Ser: 0.7 mg/dL (ref 0.44–1.00)
Glucose, Bld: 179 mg/dL — ABNORMAL HIGH (ref 65–99)
HCT: 41 % (ref 36.0–46.0)
HEMOGLOBIN: 13.9 g/dL (ref 12.0–15.0)
Potassium: 3.6 mmol/L (ref 3.5–5.1)
SODIUM: 141 mmol/L (ref 135–145)
TCO2: 27 mmol/L (ref 0–100)

## 2016-02-05 LAB — CBC
HCT: 41.9 % (ref 36.0–46.0)
HEMOGLOBIN: 13.5 g/dL (ref 12.0–15.0)
MCH: 27.1 pg (ref 26.0–34.0)
MCHC: 32.2 g/dL (ref 30.0–36.0)
MCV: 84 fL (ref 78.0–100.0)
Platelets: 150 10*3/uL (ref 150–400)
RBC: 4.99 MIL/uL (ref 3.87–5.11)
RDW: 14.8 % (ref 11.5–15.5)
WBC: 9.9 10*3/uL (ref 4.0–10.5)

## 2016-02-05 LAB — CK: CK TOTAL: 212 U/L (ref 38–234)

## 2016-02-05 LAB — LACTIC ACID, PLASMA
LACTIC ACID, VENOUS: 2.2 mmol/L — AB (ref 0.5–1.9)
Lactic Acid, Venous: 1.5 mmol/L (ref 0.5–1.9)
Lactic Acid, Venous: 1.7 mmol/L (ref 0.5–1.9)

## 2016-02-05 LAB — HEMOGLOBIN AND HEMATOCRIT, BLOOD
HCT: 41.6 % (ref 36.0–46.0)
Hemoglobin: 13.6 g/dL (ref 12.0–15.0)

## 2016-02-05 LAB — COMPREHENSIVE METABOLIC PANEL
ALT: 36 U/L (ref 14–54)
ANION GAP: 11 (ref 5–15)
AST: 62 U/L — AB (ref 15–41)
Albumin: 3.6 g/dL (ref 3.5–5.0)
Alkaline Phosphatase: 81 U/L (ref 38–126)
BUN: 19 mg/dL (ref 6–20)
CO2: 23 mmol/L (ref 22–32)
Calcium: 9.1 mg/dL (ref 8.9–10.3)
Chloride: 105 mmol/L (ref 101–111)
Creatinine, Ser: 0.7 mg/dL (ref 0.44–1.00)
GFR calc Af Amer: >60 mL/min (ref >60)
Glucose, Bld: 186 mg/dL — ABNORMAL HIGH (ref 65–99)
POTASSIUM: 3.8 mmol/L (ref 3.5–5.1)
Sodium: 139 mmol/L (ref 135–145)
Total Bilirubin: 1.4 mg/dL — ABNORMAL HIGH (ref 0.3–1.2)
Total Protein: 6.1 g/dL — ABNORMAL LOW (ref 6.5–8.1)

## 2016-02-05 LAB — CBG MONITORING, ED
GLUCOSE-CAPILLARY: 194 mg/dL — AB (ref 65–99)
Glucose-Capillary: 156 mg/dL — ABNORMAL HIGH (ref 65–99)
Glucose-Capillary: 109 mg/dL — ABNORMAL HIGH (ref 65–99)

## 2016-02-05 LAB — GLUCOSE, CAPILLARY: GLUCOSE-CAPILLARY: 112 mg/dL — AB (ref 65–99)

## 2016-02-05 LAB — TYPE AND SCREEN
ABO/RH(D): O POS
ANTIBODY SCREEN: NEGATIVE

## 2016-02-05 LAB — MAGNESIUM
MAGNESIUM: 1.8 mg/dL (ref 1.7–2.4)
MAGNESIUM: 1.8 mg/dL (ref 1.7–2.4)

## 2016-02-05 LAB — PROTIME-INR
INR: 0.92
PROTHROMBIN TIME: 12.3 s (ref 11.4–15.2)

## 2016-02-05 LAB — RAPID URINE DRUG SCREEN, HOSP PERFORMED
Amphetamines: NOT DETECTED
Barbiturates: NOT DETECTED
Benzodiazepines: NOT DETECTED
Cocaine: NOT DETECTED
OPIATES: NOT DETECTED
TETRAHYDROCANNABINOL: NOT DETECTED

## 2016-02-05 LAB — I-STAT CG4 LACTIC ACID, ED: LACTIC ACID, VENOUS: 2.33 mmol/L — AB (ref 0.5–1.9)

## 2016-02-05 LAB — BILIRUBIN, DIRECT: BILIRUBIN DIRECT: 0.1 mg/dL (ref 0.1–0.5)

## 2016-02-05 LAB — PHOSPHORUS: PHOSPHORUS: 2.8 mg/dL (ref 2.5–4.6)

## 2016-02-05 LAB — ETHANOL: Alcohol, Ethyl (B): <5 mg/dL (ref <5)

## 2016-02-05 MED ORDER — GADOBENATE DIMEGLUMINE 529 MG/ML IV SOLN
10.0000 mL | Freq: Once | INTRAVENOUS | Status: AC
  Administered 2016-02-05: 10 mL via INTRAVENOUS

## 2016-02-05 MED ORDER — LORAZEPAM 0.5 MG PO TABS
0.5000 mg | ORAL_TABLET | Freq: Once | ORAL | Status: AC
  Administered 2016-02-05: 0.5 mg via ORAL
  Filled 2016-02-05: qty 1

## 2016-02-05 MED ORDER — LEVETIRACETAM 500 MG PO TABS
500.0000 mg | ORAL_TABLET | Freq: Two times a day (BID) | ORAL | Status: DC
  Administered 2016-02-05 – 2016-02-07 (×5): 500 mg via ORAL
  Filled 2016-02-05 (×5): qty 1

## 2016-02-05 MED ORDER — ENOXAPARIN SODIUM 40 MG/0.4ML ~~LOC~~ SOLN
40.0000 mg | SUBCUTANEOUS | Status: DC
  Administered 2016-02-05 – 2016-02-06 (×2): 40 mg via SUBCUTANEOUS
  Filled 2016-02-05 (×3): qty 0.4

## 2016-02-05 MED ORDER — SODIUM CHLORIDE 0.9 % IV SOLN
INTRAVENOUS | Status: DC
  Administered 2016-02-05: 08:00:00 via INTRAVENOUS

## 2016-02-05 MED ORDER — FERROUS FUMARATE 324 (106 FE) MG PO TABS
106.0000 mg | ORAL_TABLET | Freq: Every day | ORAL | Status: DC
  Administered 2016-02-06 – 2016-02-07 (×2): 106 mg via ORAL
  Filled 2016-02-05 (×2): qty 1

## 2016-02-05 MED ORDER — ASPIRIN EC 81 MG PO TBEC
81.0000 mg | DELAYED_RELEASE_TABLET | Freq: Every day | ORAL | Status: DC
  Administered 2016-02-05 – 2016-02-07 (×3): 81 mg via ORAL
  Filled 2016-02-05 (×3): qty 1

## 2016-02-05 MED ORDER — ONDANSETRON HCL 4 MG PO TABS: 4.0000 mg | ORAL_TABLET | Freq: Four times a day (QID) | ORAL | Status: DC | PRN

## 2016-02-05 MED ORDER — ROSUVASTATIN CALCIUM 20 MG PO TABS
20.0000 mg | ORAL_TABLET | Freq: Every day | ORAL | Status: DC
  Administered 2016-02-05 – 2016-02-06 (×2): 20 mg via ORAL
  Filled 2016-02-05 (×2): qty 1

## 2016-02-05 MED ORDER — GLIMEPIRIDE 4 MG PO TABS
2.0000 mg | ORAL_TABLET | Freq: Every day | ORAL | Status: DC
  Administered 2016-02-05 – 2016-02-07 (×3): 2 mg via ORAL
  Filled 2016-02-05 (×3): qty 1

## 2016-02-05 MED ORDER — ACETAMINOPHEN 650 MG RE SUPP: 650.0000 mg | RECTAL | Status: DC | PRN

## 2016-02-05 MED ORDER — ONDANSETRON HCL 4 MG/2ML IJ SOLN: 4.0000 mg | Freq: Four times a day (QID) | INTRAMUSCULAR | Status: DC | PRN

## 2016-02-05 MED ORDER — AMLODIPINE BESYLATE 5 MG PO TABS
5.0000 mg | ORAL_TABLET | Freq: Every day | ORAL | Status: DC
  Administered 2016-02-05 – 2016-02-07 (×3): 5 mg via ORAL
  Filled 2016-02-05 (×3): qty 1

## 2016-02-05 MED ORDER — SODIUM CHLORIDE 0.9 % IV SOLN
75.0000 mL/h | INTRAVENOUS | Status: DC
  Administered 2016-02-05 – 2016-02-06 (×2): 75 mL/h via INTRAVENOUS

## 2016-02-05 MED ORDER — INSULIN ASPART 100 UNIT/ML ~~LOC~~ SOLN: 0.0000 [IU] | Freq: Every day | SUBCUTANEOUS | Status: DC

## 2016-02-05 MED ORDER — RAMIPRIL 10 MG PO CAPS
10.0000 mg | ORAL_CAPSULE | Freq: Two times a day (BID) | ORAL | Status: DC
  Administered 2016-02-05 – 2016-02-07 (×4): 10 mg via ORAL
  Filled 2016-02-05: qty 2
  Filled 2016-02-05: qty 1
  Filled 2016-02-05: qty 2
  Filled 2016-02-05: qty 1
  Filled 2016-02-05: qty 2
  Filled 2016-02-05 (×3): qty 1
  Filled 2016-02-05: qty 2

## 2016-02-05 MED ORDER — ACETAMINOPHEN 325 MG PO TABS
650.0000 mg | ORAL_TABLET | ORAL | Status: DC | PRN
Start: 2016-02-05 — End: 2016-02-07

## 2016-02-05 MED ORDER — METOPROLOL TARTRATE 50 MG PO TABS
50.0000 mg | ORAL_TABLET | Freq: Two times a day (BID) | ORAL | Status: DC
  Administered 2016-02-05 – 2016-02-07 (×5): 50 mg via ORAL
  Filled 2016-02-05 (×3): qty 1
  Filled 2016-02-05: qty 2
  Filled 2016-02-05: qty 1

## 2016-02-05 MED ORDER — INSULIN ASPART 100 UNIT/ML ~~LOC~~ SOLN
0.0000 [IU] | Freq: Three times a day (TID) | SUBCUTANEOUS | Status: DC
  Administered 2016-02-05: 2 [IU] via SUBCUTANEOUS
  Filled 2016-02-05: qty 1

## 2016-02-05 NOTE — ED Notes (Signed)
POCT CBG resulted 109; Clark, RN aware

## 2016-02-05 NOTE — ED Notes (Signed)
Patient in EEG.  

## 2016-02-05 NOTE — ED Triage Notes (Signed)
Per EMS, pt from home with possible seizure, husband states wife woke him with jerking movements, hands raised and tongue out. Upon arrival, pt A&O x 4 and able to follow commands upon arrival to this ED. CBG-173, BP-128/73, HR-103

## 2016-02-05 NOTE — Procedures (Signed)
ELECTROENCEPHALOGRAM REPORT  Date of Study: 02/05/2016  Patient's Name: [name] MRN: [id number] Date of Birth: August 19, 1940  Referring Provider: Junious SilkAllison Ellis, NP  Clinical History: This is a 76 year old woman with jerking movements.  Medications: insulin aspart (novoLOG) injection 0-9 Units  LORazepam (ATIVAN) tablet 0.5 mg   Technical Summary: A multichannel digital EEG recording measured by the international 10-20 system with electrodes applied with paste and impedances below 5000 ohms performed in our laboratory with EKG monitoring in an awake and drowsy patient.  Hyperventilation was not performed. Photic stimulation was performed.  The digital EEG was referentially recorded, reformatted, and digitally filtered in a variety of bipolar and referential montages for optimal display.    Description: The patient is awake and drowsy during the recording.  During maximal wakefulness, there is a symmetric, medium voltage 9.5 Hz posterior dominant rhythm that attenuates with eye opening.  There is occasional focal theta and delta slowing over the left temporal region. During drowsiness, there is an increase in theta and delta slowing of the background, with shifting asymmetry over the bilateral temporal regions.  Deeper stages of sleep were not seen. Photic stimulation did not elicit any abnormalities.  There were no epileptiform discharges or electrographic seizures seen.    EKG lead was unremarkable.  Impression: This awake and drowsy EEG is abnormal due to occasional focal slowing over the left temporal region.  Clinical Correlation of the above findings indicates focal cerebral dysfunction over the left temporal region suggestive of underlying structural or physiologic abnormality. The absence of epileptiform discharges does not exclude a clinical diagnosis of epilepsy. Clinical correlation is advised.   Patrcia DollyKaren Aquino, M.D.

## 2016-02-05 NOTE — ED Notes (Signed)
Ordered lunch tray, carb modified

## 2016-02-05 NOTE — ED Notes (Signed)
POCT CBG resulted 156; Clark, RN aware

## 2016-02-05 NOTE — ED Provider Notes (Addendum)
[facility]-EMERGENCY DEPT Provider Note   CSN: [id number] Arrival date & time: 02/05/16  7829  By signing my name below, I, Freida Busman, attest that this documentation has been prepared under the direction and in the presence of Tomasita Crumble, MD . Electronically Signed: Freida Busman, Scribe. 02/05/2016. 3:29 AM.  History   Chief Complaint Chief Complaint  Patient presents with  . Seizures     The history is provided by the patient. No language interpreter was used.     HPI Comments:  [name] is a 76 y.o. female with a history of DM, and HTN,  who presents to the Emergency Department via EMS, for seizure like activity PTA. The pt's husband called EMS concerned pt was having seizure. He noticed her tongue was out of her mouth, hands in the air, and she was altered. Pt had similar episode in July 2017. Pt is not on any seizure meds. Pt cannot recall events after going to bed last night. She is complaining of dizziness and confusion.  Pt denies pain at this time.  She also denies tongue bite, urinary incontinece, cough, fever, and urinary symptoms.  Past Medical History:  Diagnosis Date  . Coronary artery disease    a. 09/2007 s/p NSTEMI;  b. 04/2008 s/p CABG x 4 (LIMA->LAD, VG->D1, VG->RI, VG->dRCA).  . Diabetes mellitus   . Hyperlipidemia   . Hypertension   . Ischemic cardiomyopathy    a. 2010 EF 35-40%;  b. 09/2009 EF 65-70%.  . Mitral regurgitation    a.04/2008 s/p MVR @ time of CABG w/ a 30mm Edwards Physio II annuloplasty ring (model # [id number], ser # [id number]).  . Non-ST elevation MI (NSTEMI) (HCC) 09/2007   hx of  . PFO (patent foramen ovale)    a. 04/2008 s/p PFO closure @ time of CABG.  . Seizure (HCC)    a. 07/2014 ED visit for seizure vs syncope.  . Sepsis following intra-abdominal surgery (HCC)   . Status post CVA     Patient Active Problem List   Diagnosis Date Noted  . Ischemic cardiomyopathy   . Mitral regurgitation   . Seizure (HCC)   . S/P CABG (coronary  artery bypass graft) 03/16/2011  . Coronary artery disease   . Hypertension   . Hyperlipidemia   . Diabetes mellitus (HCC)   . Status post CVA   . Non-ST elevation MI (NSTEMI) [facility])     Past Surgical History:  Procedure Laterality Date  . CARDIAC CATHETERIZATION  04/08/2008  . CORONARY ARTERY BYPASS GRAFT  2010   lima graft to lad,saphenous vein graft tothe diagonal,saphenous vein graft to the intermediate branch  and saphenous vein graft to the distal right coronary  . MITRAL VALVE REPAIR    . status post partial colectomy      OB History    No data available       Home Medications    Prior to Admission medications   Medication Sig Start Date End Date Taking? Authorizing Provider  amLODipine (NORVASC) 5 MG tablet Take 1 tablet (5 mg total) by mouth daily. 12/22/15   Peter M [place], MD  aspirin EC 81 MG tablet Take 1 tablet (81 mg total) by mouth daily. 04/28/15   Peter M [place], MD  glimepiride (AMARYL) 2 MG tablet Take 2 mg by mouth daily.  02/23/11   Historical Provider, MD  hydrochlorothiazide (HYDRODIURIL) 25 MG tablet Take 1 tablet (25 mg total) by mouth daily. 12/22/15   Peter M [place], MD  IRON PO Take 65 mg by mouth daily.    Historical Provider, MD  metoprolol (LOPRESSOR) 50 MG tablet TAKE ONE TABLET BY MOUTH TWICE DAILY 12/22/15   Peter M SwazilandJordan, MD  Multiple Vitamin (MULTIVITAMIN) tablet Take 1 tablet by mouth daily.    Historical Provider, MD  Omega-3 Fatty Acids (FISH OIL PO) Take by mouth. Taking 4 daily    Historical Provider, MD  ramipril (ALTACE) 10 MG capsule Take 1 capsule (10 mg total) by mouth 2 (two) times daily. 12/22/15   Peter M [place]Jordan, MD  rosuvastatin (CRESTOR) 20 MG tablet Take 1 tablet (20 mg total) by mouth daily. 12/22/15   Peter M [place]azilandJordan, MD    Family History Family History  Problem Relation Age of Onset  . Hypertension Mother   . Melanoma Mother   . Heart disease Father     PPM    Social History Social History  Substance Use Topics  .  Smoking status: Never Smoker  . Smokeless tobacco: Never Used  . Alcohol use No     Allergies   Patient has no known allergies.   Review of Systems Review of Systems  10 systems reviewed and all are negative for acute change except as noted in the HPI.  Physical Exam Updated Vital Signs BP 150/72   Pulse 84   Temp 98.1 F (36.7 C) (Oral)   Resp 15   Ht 5\' 1"  (1.549 m)   Wt 150 lb (68 kg)   SpO2 95%   BMI 28.34 kg/m   Physical Exam  Constitutional: She is oriented to person, place, and time. She appears well-developed and well-nourished. No distress.  HENT:  Head: Normocephalic and atraumatic.  Nose: Nose normal.  Mouth/Throat: Oropharynx is clear and moist. No oropharyngeal exudate.  Eyes: Conjunctivae and EOM are normal. Pupils are equal, round, and reactive to light. No scleral icterus.  Neck: Normal range of motion. Neck supple. No JVD present. No tracheal deviation present. No thyromegaly present.  Cardiovascular: Normal rate, regular rhythm and normal heart sounds.  Exam reveals no gallop and no friction rub.   No murmur heard. Pulmonary/Chest: Effort normal and breath sounds normal. No respiratory distress. She has no wheezes. She exhibits no tenderness.  Abdominal: Soft. Bowel sounds are normal. She exhibits no distension and no mass. There is no tenderness. There is no rebound and no guarding.  Genitourinary: Rectal exam shows guaiac negative stool.  Musculoskeletal: Normal range of motion. She exhibits no edema or tenderness.  Lymphadenopathy:    She has no cervical adenopathy.  Neurological: She is alert and oriented to person, place, and time. No cranial nerve deficit. She exhibits normal muscle tone.  Normal strength and sensation to all extremities Normal cerebellar testing  Skin: Skin is warm and dry. No rash noted. No erythema. No pallor.  Nursing note and vitals reviewed.    ED Treatments / Results  DIAGNOSTIC STUDIES:  Oxygen Saturation is 100%  on room air, normal by my interpretation.     Labs (all labs ordered are listed, but only abnormal results are displayed) Labs Reviewed  CBC WITH DIFFERENTIAL/PLATELET - Abnormal; Notable for the following:       Result Value   RBC 2.80 (*)    Hemoglobin 7.6 (*)    HCT 23.9 (*)    Lymphs Abs 0.5 (*)    All other components within normal limits  COMPREHENSIVE METABOLIC PANEL - Abnormal; Notable for the following:    Glucose, Bld 186 (*)  Total Protein 6.1 (*)    AST 62 (*)    Total Bilirubin 1.4 (*)    All other components within normal limits  URINALYSIS, ROUTINE W REFLEX MICROSCOPIC - Abnormal; Notable for the following:    APPearance CLOUDY (*)    Leukocytes, UA TRACE (*)    All other components within normal limits  I-STAT CHEM 8, ED - Abnormal; Notable for the following:    BUN 27 (*)    Glucose, Bld 179 (*)    All other components within normal limits  I-STAT CG4 LACTIC ACID, ED - Abnormal; Notable for the following:    Lactic Acid, Venous 2.33 (*)    All other components within normal limits  URINE CULTURE  PROTIME-INR  MAGNESIUM  RAPID URINE DRUG SCREEN, HOSP PERFORMED  ETHANOL  CBG MONITORING, ED  POC OCCULT BLOOD, ED    EKG  EKG Interpretation  Date/Time:  Friday February 05 2016 03:27:24 EST Ventricular Rate:  84 PR Interval:    QRS Duration: 108 QT Interval:  400 QTC Calculation: 473 R Axis:   -61 Text Interpretation:  Sinus rhythm Incomplete RBBB and LAFB Probable anteroseptal infarct, old PVC no longer present Confirmed by Erroll Luna [phone]) on 02/05/2016 3:30:18 AM       Radiology Dg Chest 2 View  Result Date: 02/05/2016 CLINICAL DATA:  76 y/o  F; altered mental status. EXAM: CHEST  2 VIEW COMPARISON:  08/13/2015 chest radiograph FINDINGS: Stable mildly enlarged cardiac silhouette. Aortic atherosclerosis with calcification. Post median sternotomy, CABG, and mitral annuloplasty. Pulmonary venous hypertension. No focal consolidation. No  pleural effusion. Exaggerated thoracic kyphosis. No acute osseous abnormality is evident. IMPRESSION: Mild stable cardiomegaly.  Pulmonary venous hypertension. Electronically Signed   By: Mitzi Hansen M.D.   On: 02/05/2016 04:10   Ct Head Wo Contrast  Result Date: 02/05/2016 CLINICAL DATA:  Altered mental status. EXAM: CT HEAD WITHOUT CONTRAST TECHNIQUE: Contiguous axial images were obtained from the base of the skull through the vertex without intravenous contrast. COMPARISON:  Head CT 08/13/2015 FINDINGS: Brain: No evidence of acute infarction, hemorrhage, hydrocephalus, extra-axial collection or mass lesion/mass effect. Remote right cerebellar infarct. Generalized atrophy and chronic small vessel ischemia, stable. Vascular: Atherosclerosis of skullbase vasculature without hyperdense vessel or abnormal calcification. Skull: Normal. Negative for fracture or focal lesion. Sinuses/Orbits: Chronic mucous retention cyst in the left maxillary sinus is unchanged. No acute sinus inflammation. No acute orbital abnormality. Other: None. IMPRESSION: 1.  No acute intracranial abnormality. 2. Stable atrophy, chronic small vessel ischemia, and remote right cerebellar infarct. Electronically Signed   By: Rubye Oaks M.D.   On: 02/05/2016 03:46    Procedures Procedures (including critical care time)  Medications Ordered in ED Medications - No data to display   Initial Impression / Assessment and Plan / ED Course  I have reviewed the triage vital signs and the nursing notes.  Pertinent labs & imaging results that were available during my care of the patient were reviewed by me and considered in my medical decision making (see chart for details).     Patient presents to the ED for sycnope vs seizure.  She does not recall the event but she was in bed the entire time.  CT had is normal. Labs reveal an acute drop in hemoglobin to 7.6.  This is concerning for symptomatic anemia causing syncope.  Hemoccult is negative.  Plan to admit to hospitalist for further evaluation of her anemia.  Remainder of labs and ifectious work up  is negative. Type and screen sent.  Final Clinical Impressions(s) / ED Diagnoses   Final diagnoses:  None    New Prescriptions New Prescriptions   No medications on file     I personally performed the services described in this documentation, which was scribed in my presence. The recorded information has been reviewed and is accurate.       Tomasita Crumble, MD 02/05/16 [id number]    Tomasita Crumble, MD 02/05/16 [phone]

## 2016-02-05 NOTE — ED Notes (Signed)
Hemoccult card  at bedside 

## 2016-02-05 NOTE — Progress Notes (Signed)
EEG Completed; Results Pending  

## 2016-02-05 NOTE — H&P (Signed)
History and Physical    Katrina PopeShirley H Botero ZOX:[id number] DOB: [birth year] DOA: 02/05/2016   PCP: Lilia ArgueKAPLAN,KRISTEN, PA-C   Patient coming from/Resides with: Private residence/lives with husband  Admission status: Observation/telemetry -it may be medically necessary to stay a minimum 2 midnights to rule out impending and/or unexpected changes in physiologic status that may differ from initial evaluation performed in the ER and/or at time of admission therefore consider reevaluation of admission status and 24 hours.   Chief Complaint: Seizure-like activity  HPI: [name] is a 76 y.o. female with medical history significant for CAD status post CABG, history of prior ischemic cardiomyopathy with recovered EF, diabetes on oral agents, this up of dyslipidemia, hypertension and prior stroke. EMS was called to the patient's home by her husband after he awakened noting the wife was having jerking movements, her hands or raise her tongue was out. Unclear if post ictal phase. Upon arrival of EMS patient was alert and oriented to follow commands with CBG 173, BP 128/73 and heart rate 103. She had similar episode in July and was sent to the ER for evaluation. According to the documentation the husband was awakened with a real loud snoring sound which was uncommon for her. He was unable to wake in the patient. Her tongue was sticking out and she was foaming at the mouth. She was unresponsive and he attempted to sit her up and she was unable to sit up. He described her "eyes rolling up in the back of her head". She apparently was breathing hard. Patient was evaluated by her cardiologist for routine follow-up and based on the symptoms there were concerns that patient may have sleep apnea syndrome with overnight pulse oximetry study was obtained that was within normal limits without evidence of hypoxemia.  ED Course:  Vital Signs: BP (!) 120/51   Pulse 69   Temp 98.1 F (36.7 C) (Oral)   Resp 20   Ht 5'  1" (1.549 m)   Wt 68 kg (150 lb)   SpO2 92%   BMI 28.34 kg/m  CT head without contrast: No acute intracranial abnormality 2 view chest x-ray: Mild stable cardiomegaly with pulmonary venous hypertension. Lab data: Sodium 139, potassium 3.8, CO2 23, glucose 186, BUN 19, creatinine 0.7, AST 62, total bilirubin 1.4, alkaline phosphatase normal. Lactic acid 2.33, white count 9900 differential not obtained, hemoglobin 13.6, platelets 150,000, coags normal, urinalysis cloudy in appearance with trace leukocytes otherwise normal, alcohol level less than 5, urine drug screen negative, urine culture pending. Medications and treatments: None  Review of Systems:  In addition to the HPI above,  No Fever-chills, myalgias or other constitutional symptoms No Headache, changes with Vision or hearing, new weakness, tingling, numbness in any extremity, dizziness, dysarthria or word finding difficulty, gait disturbance or imbalance No problems swallowing food or Liquids, indigestion/reflux, choking or coughing while eating, abdominal pain with or after eating No Chest pain, Cough or Shortness of Breath, palpitations, orthopnea or DOE No Abdominal pain, N/V, melena,hematochezia, dark tarry stools, constipation No dysuria, malodorous urine, hematuria or flank pain No new skin rashes, lesions, masses or bruises, No new joint pains, aches, swelling or redness No recent unintentional weight gain or loss No polyuria, polydypsia or polyphagia   Past Medical History:  Diagnosis Date  . Coronary artery disease    a. 09/2007 s/p NSTEMI;  b. 04/2008 s/p CABG x 4 (LIMA->LAD, VG->D1, VG->RI, VG->dRCA).  . Diabetes mellitus   . Hyperlipidemia   . Hypertension   . Ischemic  cardiomyopathy    a. 2010 EF 35-40%;  b. 09/2009 EF 65-70%.  . Mitral regurgitation    a.04/2008 s/p MVR @ time of CABG w/ a 30mm Edwards Physio II annuloplasty ring (model # [id number], ser # [id number]).  . Non-ST elevation MI (NSTEMI) (HCC) 09/2007   hx of    . PFO (patent foramen ovale)    a. 04/2008 s/p PFO closure @ time of CABG.  . Seizure (HCC)    a. 07/2014 ED visit for seizure vs syncope.  . Sepsis following intra-abdominal surgery (HCC)   . Status post CVA     Past Surgical History:  Procedure Laterality Date  . CARDIAC CATHETERIZATION  04/08/2008  . CORONARY ARTERY BYPASS GRAFT  2010   lima graft to lad,saphenous vein graft tothe diagonal,saphenous vein graft to the intermediate branch  and saphenous vein graft to the distal right coronary  . MITRAL VALVE REPAIR    . status post partial colectomy      Social History   Social History  . Marital status: Married    Spouse name: N/A  . Number of children: 3  . Years of education: N/A   Occupational History  . Not on file.   Social History Main Topics  . Smoking status: Never Smoker  . Smokeless tobacco: Never Used  . Alcohol use No  . Drug use: No  . Sexual activity: Not Currently   Other Topics Concern  . Not on file   Social History Narrative  . No narrative on file    Mobility: Without assistive devices Work history: Not obtained   No Known Allergies  Family History  Problem Relation Age of Onset  . Hypertension Mother   . Melanoma Mother   . Heart disease Father     PPM    Prior to Admission medications   Medication Sig Start Date End Date Taking? Authorizing Provider  amLODipine (NORVASC) 5 MG tablet Take 1 tablet (5 mg total) by mouth daily. 12/22/15  Yes Peter M [place], MD  aspirin EC 81 MG tablet Take 1 tablet (81 mg total) by mouth daily. 04/28/15  Yes Peter M [place], MD  glimepiride (AMARYL) 2 MG tablet Take 2 mg by mouth daily.  02/23/11  Yes Historical Provider, MD  hydrochlorothiazide (HYDRODIURIL) 25 MG tablet Take 1 tablet (25 mg total) by mouth daily. 12/22/15  Yes Peter M [place], MD  IRON PO Take 27 mg by mouth daily.    Yes Historical Provider, MD  metoprolol (LOPRESSOR) 50 MG tablet TAKE ONE TABLET BY MOUTH TWICE DAILY 12/22/15  Yes Peter M  [place], MD  Potassium 99 MG TABS Take 1 tablet by mouth daily.   Yes Historical Provider, MD  ramipril (ALTACE) 10 MG capsule Take 1 capsule (10 mg total) by mouth 2 (two) times daily. 12/22/15  Yes Peter M [place], MD  rosuvastatin (CRESTOR) 20 MG tablet Take 1 tablet (20 mg total) by mouth daily. 12/22/15  Yes Peter M [place], MD    Physical Exam: Vitals:   02/05/16 0615 02/05/16 0630 02/05/16 0645 02/05/16 0700  BP: 126/56 127/63 122/59 (!) 120/51  Pulse: 73 75 70 69  Resp: 19 20 19 20   Temp:      TempSrc:      SpO2: 92% 92% 93% 92%  Weight:      Height:          Constitutional: NAD, calm, comfortable Eyes: PERRL, lids and conjunctivae normal ENMT: Mucous membranes are moist. Posterior pharynx clear  of any exudate or lesions.Normal dentition.  Neck: normal, supple, no masses, no thyromegaly Respiratory: clear to auscultation bilaterally, no wheezing, no crackles. Normal respiratory effort. No accessory muscle use.  Cardiovascular: Regular rate and rhythm, no murmurs / rubs / gallops. No extremity edema. 2+ pedal pulses. No carotid bruits.  Abdomen: no tenderness, no masses palpated. No hepatosplenomegaly. Bowel sounds positive.  Musculoskeletal: no clubbing / cyanosis. No joint deformity upper and lower extremities. Good ROM, no contractures. Normal muscle tone.  Skin: no rashes, lesions, ulcers. No induration Neurologic: CN 2-12 grossly intact. Sensation intact, DTR normal. Strength 5/5 x all 4 extremities.  Psychiatric: Normal judgment and insight. Alert and oriented x 3. Normal mood.    Labs on Admission: I have personally reviewed following labs and imaging studies  CBC:  Recent Labs Lab 02/05/16 0415 02/05/16 0422 02/05/16 0554  WBC 5.3  --  9.9  NEUTROABS 4.4  --   --   HGB 7.6* 13.9 13.5  13.6  HCT 23.9* 41.0 41.9  41.6  MCV 85.4  --  84.0  PLT 91*  --  150   Basic Metabolic Panel:  Recent Labs Lab 02/05/16 0415 02/05/16 0422  NA 139 141  K 3.8 3.6    CL 105 103  CO2 23  --   GLUCOSE 186* 179*  BUN 19 27*  CREATININE 0.70 0.70  CALCIUM 9.1  --   MG 1.8  --    GFR: Estimated Creatinine Clearance: 55.3 mL/min (by C-G formula based on SCr of 0.7 mg/dL). Liver Function Tests:  Recent Labs Lab 02/05/16 0415  AST 62*  ALT 36  ALKPHOS 81  BILITOT 1.4*  PROT 6.1*  ALBUMIN 3.6   No results for input(s): LIPASE, AMYLASE in the last 168 hours. No results for input(s): AMMONIA in the last 168 hours. Coagulation Profile:  Recent Labs Lab 02/05/16 0415  INR 0.92   Cardiac Enzymes: No results for input(s): CKTOTAL, CKMB, CKMBINDEX, TROPONINI in the last 168 hours. BNP (last 3 results) No results for input(s): PROBNP in the last 8760 hours. HbA1C: No results for input(s): HGBA1C in the last 72 hours. CBG:  Recent Labs Lab 02/05/16 0743  GLUCAP 156*   Lipid Profile: No results for input(s): CHOL, HDL, LDLCALC, TRIG, CHOLHDL, LDLDIRECT in the last 72 hours. Thyroid Function Tests: No results for input(s): TSH, T4TOTAL, FREET4, T3FREE, THYROIDAB in the last 72 hours. Anemia Panel: No results for input(s): VITAMINB12, FOLATE, FERRITIN, TIBC, IRON, RETICCTPCT in the last 72 hours. Urine analysis:    Component Value Date/Time   COLORURINE YELLOW 02/05/2016 0417   APPEARANCEUR CLOUDY (A) 02/05/2016 0417   LABSPEC 1.015 02/05/2016 0417   PHURINE 5.0 02/05/2016 0417   GLUCOSEU NEGATIVE 02/05/2016 0417   HGBUR NEGATIVE 02/05/2016 0417   BILIRUBINUR NEGATIVE 02/05/2016 0417   KETONESUR NEGATIVE 02/05/2016 0417   PROTEINUR NEGATIVE 02/05/2016 0417   UROBILINOGEN 1.0 04/17/2008 1516   NITRITE NEGATIVE 02/05/2016 0417   LEUKOCYTESUR TRACE (A) 02/05/2016 0417   Sepsis Labs: @LABRCNTIP (procalcitonin:4,lacticidven:4) )No results found for this or any previous visit (from the past 240 hour(s)).   Radiological Exams on Admission: Dg Chest 2 View  Result Date: 02/05/2016 CLINICAL DATA:  76 y/o  F; altered mental status.  EXAM: CHEST  2 VIEW COMPARISON:  08/13/2015 chest radiograph FINDINGS: Stable mildly enlarged cardiac silhouette. Aortic atherosclerosis with calcification. Post median sternotomy, CABG, and mitral annuloplasty. Pulmonary venous hypertension. No focal consolidation. No pleural effusion. Exaggerated thoracic kyphosis. No acute osseous abnormality is  evident. IMPRESSION: Mild stable cardiomegaly.  Pulmonary venous hypertension. Electronically Signed   By: Mitzi Hansen M.D.   On: 02/05/2016 04:10   Ct Head Wo Contrast  Result Date: 02/05/2016 CLINICAL DATA:  Altered mental status. EXAM: CT HEAD WITHOUT CONTRAST TECHNIQUE: Contiguous axial images were obtained from the base of the skull through the vertex without intravenous contrast. COMPARISON:  Head CT 08/13/2015 FINDINGS: Brain: No evidence of acute infarction, hemorrhage, hydrocephalus, extra-axial collection or mass lesion/mass effect. Remote right cerebellar infarct. Generalized atrophy and chronic small vessel ischemia, stable. Vascular: Atherosclerosis of skullbase vasculature without hyperdense vessel or abnormal calcification. Skull: Normal. Negative for fracture or focal lesion. Sinuses/Orbits: Chronic mucous retention cyst in the left maxillary sinus is unchanged. No acute sinus inflammation. No acute orbital abnormality. Other: None. IMPRESSION: 1.  No acute intracranial abnormality. 2. Stable atrophy, chronic small vessel ischemia, and remote right cerebellar infarct. Electronically Signed   By: Rubye Oaks M.D.   On: 02/05/2016 03:46    EKG: (Independently reviewed) sinus rhythm with ventricular rate 84 bpm, QTC 473 ms, incomplete right bundle branch block, no ischemic changes  Assessment/Plan Principal Problem:   Seizure  -Patient has experienced 2 episodes concerning for seizure-like activity with associated post ictal phase in the past 6 months; most recent was sewed associated with tonic-clonic movements -Neurological  evaluation in July with normal carotid duplex and stable echocardiogram; cardiac event monitor normal in the outpatient setting -Discussed with Dr. Nath/neurology who recommended MRI with and without contrast; he agreed with EEG ordered and recommended starting low-dose Keppra 500 twice a day -Lactic acid, check CK-gentle IV fluids until a.m.-there may be a degree of mild volume depletion noting subtle elevation in AST and total bilirubin as well -Ativan 0.5 mg PO minutes prior to MRI use for mild sedation related to anticipated anxiety -Orthostatic vital signs negative  Active Problems:   Status post CVA -Currently no strokelike symptoms -Continue baby aspirin and statin    Hypertension -Moderately controlled -Continue home medications: Norvasc, Lopressor and Altace -Hold diuretic    Diabetes mellitus  -Continue Amaryl -Hemoglobin A1c -CBGs -SSI    Ischemic cardiomyopathy -Echocardiogram August 2017: EF 60-65%, normal wall motion, mild mitral stenosis and mild regurgitation, mild pulmonary hypertension 32 mmHg -Continue medical management as above with beta blocker and ACE inhibitor -Holding thiazide diuretic    Coronary artery disease/ S/P CABG (coronary artery bypass graft) -Continue baby aspirin, beta blocker and statin -Currently asymptomatic -Evaluated by cardiologist December 2017    Hyperlipidemia -Statin      DVT prophylaxis: Lovenox Code Status: Full Family Communication: No family at bedside Disposition Plan: Anticipate discharge back to preadmission home environment when medically stable Consults called: Neurology/Nath (telephone consultation)    ELLIS,ALLISON L. ANP-BC [facility] Pager [phone]   If 7PM-7AM, please contact night-coverage [email or web address] Password [email or web address]  02/05/2016, 8:02 AM

## 2016-02-05 NOTE — ED Notes (Signed)
Assisted patient off of bedpan; patient was unable to go

## 2016-02-06 DIAGNOSIS — R569 Unspecified convulsions: Secondary | ICD-10-CM | POA: Diagnosis not present

## 2016-02-06 LAB — URINE CULTURE

## 2016-02-06 LAB — GLUCOSE, CAPILLARY
GLUCOSE-CAPILLARY: 91 mg/dL (ref 65–99)
GLUCOSE-CAPILLARY: 87 mg/dL (ref 65–99)
Glucose-Capillary: 102 mg/dL — ABNORMAL HIGH (ref 65–99)
Glucose-Capillary: 95 mg/dL (ref 65–99)

## 2016-02-06 LAB — COMPREHENSIVE METABOLIC PANEL
ALBUMIN: 3.1 g/dL — AB (ref 3.5–5.0)
ALT: 26 U/L (ref 14–54)
AST: 30 U/L (ref 15–41)
Alkaline Phosphatase: 72 U/L (ref 38–126)
Anion gap: 5 (ref 5–15)
BUN: 16 mg/dL (ref 6–20)
CHLORIDE: 111 mmol/L (ref 101–111)
CO2: 26 mmol/L (ref 22–32)
Calcium: 8.7 mg/dL — ABNORMAL LOW (ref 8.9–10.3)
Creatinine, Ser: 0.69 mg/dL (ref 0.44–1.00)
GFR calc Af Amer: >60 mL/min (ref >60)
Glucose, Bld: 102 mg/dL — ABNORMAL HIGH (ref 65–99)
POTASSIUM: 2.7 mmol/L — AB (ref 3.5–5.1)
Sodium: 142 mmol/L (ref 135–145)
Total Bilirubin: 0.9 mg/dL (ref 0.3–1.2)
Total Protein: 5.7 g/dL — ABNORMAL LOW (ref 6.5–8.1)

## 2016-02-06 LAB — HEMOGLOBIN A1C
HEMOGLOBIN A1C: 6.2 % — AB (ref 4.8–5.6)
Mean Plasma Glucose: 131 mg/dL

## 2016-02-06 MED ORDER — POTASSIUM CHLORIDE CRYS ER 20 MEQ PO TBCR
40.0000 meq | EXTENDED_RELEASE_TABLET | Freq: Once | ORAL | Status: AC
  Administered 2016-02-06: 40 meq via ORAL
  Filled 2016-02-06: qty 2

## 2016-02-06 MED ORDER — SODIUM CHLORIDE 0.9 % IV SOLN
30.0000 meq | Freq: Once | INTRAVENOUS | Status: AC
  Administered 2016-02-06: 30 meq via INTRAVENOUS
  Filled 2016-02-06: qty 15

## 2016-02-06 NOTE — Significant Event (Addendum)
CRITICAL VALUE ALERT  Critical value received:  2.7 potassium  Date of notification:  02-06-16  Time of notification:  0402  Critical value read back:Yes.    Nurse who received alert:  Scarlette SliceHannah Tarun Patchell, RN  MD notified (1st page):  K. Keenan BachelorSofia, [place]  Time of first page:  0403  MD notified (2nd page): K. Keenan BachelorSofia, [place]  Time of second page: [phone]  Responding MD:  M. Lynch, NP; orders placed for oral and IVPB potassium  Time MD responded: 0430

## 2016-02-06 NOTE — Progress Notes (Signed)
PROGRESS NOTE  [name]  ZOX:[id number] DOB: 1941/01/17  DOA: 02/05/2016 PCP: Lilia Argue   Brief Narrative:  Ms.Hjort is a 76 y.o. female with medical history significant for prior Stroke, CAD/ Ischemic Cardiomyopathy with recovered EF, status post CABG, DM 2, Dyslipidemia and Hypertension.  She was brought into the ED by EMS, presenting with a days history of an episode of jerking movements of her extremities with unresponsiveness associated with forming at the mouth and tongue sticking out. Her eyes were said to be "rolling up in the back of her head". There was no bowel or bladder incontinence. She had been in her usual state of health until she went to bed the night before presentation, and did not recall the event. She had been in bed throughout the event and did not sustain any traumatic injury.  She has had 2 episodes of seizure-like activity with postictal phase in the last 6 months with witnessed tonic-clonic movements noted with her last episode, status post normal carotid duplex and stable echocardiogram as well as event monitor in July 2017 for concerns of possible syncope. She has history of previous CVA without any stroke-like symptoms.  Evaluation in the ED revealed negative chest x-ray and CT scan of the head. A1c was 6.2% with hypokalemia' lactic acid level was initially elevated-normalized subsequently.  Assessment & Plan:   Principal Problem:   Seizure [facility]) Active Problems:   Status post CVA   Coronary artery disease   Hypertension   Hyperlipidemia   Diabetes mellitus (HCC)   S/P CABG (coronary artery bypass graft)   Ischemic cardiomyopathy  1. Seizures: New.Marland Kitchen CT/MRI brain negative EEG abnormal - neurologic consulted. Past seizure medication initiated.  2. Hypokalemia: Potassium repletion  3. CAD status post CABG/ischemic cardiomyopathy: Echo August 2017 indicated EF of 60-65% without any regional wall motion abnormality. Medical  management for now- continue beta blocker, antiplatelet and statin. Diuretic on hold.  4. Diabetes mellitus: Controlled with A1c of 6.2%.  Continue oral hypoglycemic with CBG checks.      DVT prophylaxis: Lovenox  Code Status: Full code  Family Communication:  Disposition Plan: Home   Consultants:  Neurology - S. Eshraghi   Procedures:  EEG, 02/06/2016- abnormal - occasional focal slowing over the left temporal region  Antimicrobials:  None    Subjective: No seizure activity or any other acute events noted overnight.   Objective:  Vitals:   02/06/16 0506 02/06/16 0914 02/06/16 1336 02/06/16 1700  BP: 118/61 (!) 122/54 (!) 121/57 (!) 113/54  Pulse: 63 64 61 (!) 57  Resp: 16 16 20 20   Temp: 98.4 F (36.9 C) 97.9 F (36.6 C) 98.2 F (36.8 C) 97.6 F (36.4 C)  TempSrc: Oral Oral Oral Oral  SpO2: 99% 100% 96% 96%  Weight:      Height:        Intake/Output Summary (Last 24 hours) at 02/06/16 1718 Last data filed at 02/06/16 1600  Gross per 24 hour  Intake          2641.25 ml  Output                0 ml  Net          2641.25 ml   Filed Weights   02/05/16 0327  Weight: 68 kg (150 lb)    Examination:  General exam: Resting comfortably, no acute distress  Respiratory system: Clear to auscultation. Respiratory effort normal. Cardiovascular system: S1 & S2 heard, RRR. No JVD, murmurs, rubs, gallops  or clicks. No pedal edema. Gastrointestinal system: Abdomen is nondistended, soft and nontender. No organomegaly or masses felt. Normal bowel sounds heard. Central nervous system: Alert and oriented. No focal neurological deficits. Extremities: Symmetric 5 x 5 power. Skin: No rashes, lesions or ulcers Psychiatry: Judgement and insight appear normal    Data Reviewed: I have personally reviewed following labs and imaging studies  CBC:  Recent Labs Lab 02/05/16 0415 02/05/16 0422 02/05/16 0554  WBC 5.3  --  9.9  NEUTROABS 4.4  --   --   HGB 7.6* 13.9  13.5  13.6  HCT 23.9* 41.0 41.9  41.6  MCV 85.4  --  84.0  PLT 91*  --  150   Basic Metabolic Panel:  Recent Labs Lab 02/05/16 0415 02/05/16 0422 02/05/16 1300 02/06/16 0250  NA 139 141  --  142  K 3.8 3.6  --  2.7*  CL 105 103  --  111  CO2 23  --   --  26  GLUCOSE 186* 179*  --  102*  BUN 19 27*  --  16  CREATININE 0.70 0.70  --  0.69  CALCIUM 9.1  --   --  8.7*  MG 1.8  --  1.8  --   PHOS  --   --  2.8  --    GFR: Estimated Creatinine Clearance: 55.3 mL/min (by C-G formula based on SCr of 0.69 mg/dL). Liver Function Tests:  Recent Labs Lab 02/05/16 0415 02/06/16 0250  AST 62* 30  ALT 36 26  ALKPHOS 81 72  BILITOT 1.4* 0.9  PROT 6.1* 5.7*  ALBUMIN 3.6 3.1*   No results for input(s): LIPASE, AMYLASE in the last 168 hours. No results for input(s): AMMONIA in the last 168 hours. Coagulation Profile:  Recent Labs Lab 02/05/16 0415  INR 0.92   Cardiac Enzymes:  Recent Labs Lab 02/05/16 0554  CKTOTAL 212   BNP (last 3 results) No results for input(s): PROBNP in the last 8760 hours. HbA1C:  Recent Labs  02/05/16 0737  HGBA1C 6.2*   CBG:  Recent Labs Lab 02/05/16 1232 02/05/16 2306 02/06/16 0614 02/06/16 1109 02/06/16 1614  GLUCAP 109* 112* 91 102* 95   Lipid Profile: No results for input(s): CHOL, HDL, LDLCALC, TRIG, CHOLHDL, LDLDIRECT in the last 72 hours. Thyroid Function Tests: No results for input(s): TSH, T4TOTAL, FREET4, T3FREE, THYROIDAB in the last 72 hours. Anemia Panel: No results for input(s): VITAMINB12, FOLATE, FERRITIN, TIBC, IRON, RETICCTPCT in the last 72 hours.  Sepsis Labs:  Recent Labs Lab 02/05/16 0415 02/05/16 0422 02/05/16 0554 02/05/16 0737 02/05/16 1300 02/05/16 1519  WBC 5.3  --  9.9  --   --   --   LATICACIDVEN  --  2.33*  --  2.2* 1.5 1.7    Recent Results (from the past 240 hour(s))  Urine culture     Status: Abnormal   Collection Time: 02/05/16  4:17 AM  Result Value Ref Range Status    Specimen Description URINE, CATHETERIZED  Final   Special Requests NONE  Final   Culture MULTIPLE SPECIES PRESENT, SUGGEST RECOLLECTION (A)  Final   Report Status 02/06/2016 FINAL  Final         Radiology Studies: Dg Chest 2 View  Result Date: 02/05/2016 CLINICAL DATA:  76 y/o  F; altered mental status. EXAM: CHEST  2 VIEW COMPARISON:  08/13/2015 chest radiograph FINDINGS: Stable mildly enlarged cardiac silhouette. Aortic atherosclerosis with calcification. Post median sternotomy, CABG, and mitral annuloplasty.  Pulmonary venous hypertension. No focal consolidation. No pleural effusion. Exaggerated thoracic kyphosis. No acute osseous abnormality is evident. IMPRESSION: Mild stable cardiomegaly.  Pulmonary venous hypertension. Electronically Signed   By: Mitzi HansenLance  Furusawa-Stratton M.D.   On: 02/05/2016 04:10   Ct Head Wo Contrast  Result Date: 02/05/2016 CLINICAL DATA:  Altered mental status. EXAM: CT HEAD WITHOUT CONTRAST TECHNIQUE: Contiguous axial images were obtained from the base of the skull through the vertex without intravenous contrast. COMPARISON:  Head CT 08/13/2015 FINDINGS: Brain: No evidence of acute infarction, hemorrhage, hydrocephalus, extra-axial collection or mass lesion/mass effect. Remote right cerebellar infarct. Generalized atrophy and chronic small vessel ischemia, stable. Vascular: Atherosclerosis of skullbase vasculature without hyperdense vessel or abnormal calcification. Skull: Normal. Negative for fracture or focal lesion. Sinuses/Orbits: Chronic mucous retention cyst in the left maxillary sinus is unchanged. No acute sinus inflammation. No acute orbital abnormality. Other: None. IMPRESSION: 1.  No acute intracranial abnormality. 2. Stable atrophy, chronic small vessel ischemia, and remote right cerebellar infarct. Electronically Signed   By: Rubye OaksMelanie  Ehinger M.D.   On: 02/05/2016 03:46   Mr Laqueta JeanBrain W WJWo Contrast  Result Date: 02/05/2016 CLINICAL DATA:  Seizure. EXAM: MRI  HEAD WITHOUT AND WITH CONTRAST TECHNIQUE: Multiplanar, multiecho pulse sequences of the brain and surrounding structures were obtained without and with intravenous contrast. CONTRAST:  10mL MULTIHANCE GADOBENATE DIMEGLUMINE 529 MG/ML IV SOLN COMPARISON:  CT head without contrast 02/05/2016 FINDINGS: Brain: A remote inferior right cerebellar infarct is again seen. Mild atrophy and white matter changes are otherwise within normal limits for age and without significant interval change. The ventricles are proportionate to the degree of atrophy. No significant extra-axial fluid collection is present. The brainstem and cerebellum are otherwise unremarkable. Dedicated imaging of the temporal lobes demonstrate symmetric size and signal of the hippocampal structures. The postcontrast images demonstrate no pathologic enhancement. Vascular: Flow is present in the major intracranial arteries. Skull and upper cervical spine: The skullbase is within normal limits. Midline sagittal structures are unremarkable. Craniocervical junction is within normal limits. The marrow signal is within normal limits. Sinuses/Orbits: A large polyp or mucous retention cyst is again noted within the left maxillary sinus. There is mild mucosal thickening in the left maxillary sinus as well. No fluid levels are present. The remaining paranasal sinuses and the mastoid air cells are within normal limits bilaterally. The globes and orbits are normal. IMPRESSION: 1. No acute or focal abnormality to explain seizure. 2. Remote a right cerebellar infarct. 3. Otherwise normal MRI appearance of the brain for age. 4. Bilateral maxillary sinus disease as described. Electronically Signed   By: Marin Robertshristopher  Mattern M.D.   On: 02/05/2016 12:05        Scheduled Meds: . amLODipine  5 mg Oral Daily  . aspirin EC  81 mg Oral Daily  . enoxaparin (LOVENOX) injection  40 mg Subcutaneous Q24H  . Ferrous Fumarate  106 mg of iron Oral Daily  . glimepiride  2 mg  Oral Daily  . insulin aspart  0-5 Units Subcutaneous QHS  . insulin aspart  0-9 Units Subcutaneous TID WC  . levETIRAcetam  500 mg Oral BID  . metoprolol  50 mg Oral BID  . ramipril  10 mg Oral BID  . rosuvastatin  20 mg Oral q1800   Continuous Infusions: . sodium chloride Stopped (02/05/16 1442)  . sodium chloride 75 mL/hr (02/06/16 0129)     LOS: 0 days    Time spent: 6733    OSEI-BONSU,Kobie Matkins, MD [facility] Pager  [phone]  If 7PM-7AM, please contact night-coverage [email or web address] Password [email or web address] 02/06/2016, 5:18 PM

## 2016-02-06 NOTE — Consult Note (Signed)
Neurology Consultation Reason for Consult: Seizures Referring Physician: Osei-Bonsu, G  CC: Seizures  History is obtained from: Patient, husband  HPI: [name] is a 76 y.o. female who presents with episode concerning for seizure. She was in bed, and around 3 AM the husband awoke to vocalization. He turned on the light and found her with her arms stretched out and completely tense. This continued for less than 5 minutes(he is unsure exactly time) followed by 20-25 minutes of confusion. She did not have any urinary incontinence or tongue biting.  She had a similar episode minus the rigid extremities back in June of last year.  She had a workup including MRI which did not reveal any clear seizure focus, as well as an EEG which demonstrated left temporal dysfunction without clear epileptiform discharges.    ROS: A 14 point ROS was performed and is negative except as noted in the HPI.   Past Medical History:  Diagnosis Date  . Coronary artery disease    a. 09/2007 s/p NSTEMI;  b. 04/2008 s/p CABG x 4 (LIMA->LAD, VG->D1, VG->RI, VG->dRCA).  . Diabetes mellitus   . Hyperlipidemia   . Hypertension   . Ischemic cardiomyopathy    a. 2010 EF 35-40%;  b. 09/2009 EF 65-70%.  . 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  . Non-ST elevation MI (NSTEMI) (HCC) 09/2007   hx of  . PFO (patent foramen ovale)    a. 04/2008 s/p PFO closure @ time of CABG.  . Seizure (HCC)    a. 07/2014 ED visit for seizure vs syncope.  . Sepsis following intra-abdominal surgery (HCC)   . Status post CVA      Family History  Problem Relation Age of Onset  . Hypertension Mother   . Melanoma Mother   . Heart disease Father     PPM     Social History:  reports that she has never smoked. She has never used smokeless tobacco. She reports that she does not drink alcohol or use drugs.   Exam: Current vital signs: BP  124/62 (BP Location: Right Arm)   Pulse 67   Temp 98.5 F (36.9 C) (Oral)   Resp 20   Ht 5\' 1"  (1.549 m)   Wt 68 kg (150 lb)   SpO2 94%   BMI 28.34 kg/m  Vital signs in last 24 hours: Temp:  [97.6 F (36.4 C)-98.5 F (36.9 C)] 98.5 F (36.9 C) (01/20 2228) Pulse Rate:  [57-67] 67 (01/20 2228) Resp:  [16-20] 20 (01/20 2228) BP: (106-124)/(53-62) 124/62 (01/20 2228) SpO2:  [94 %-100 %] 94 % (01/20 2228)   Physical Exam  Constitutional: Appears well-developed and well-nourished.  Psych: Affect appropriate to situation Eyes: No scleral injection HENT: No OP obstrucion Head: Normocephalic.  Cardiovascular: Normal rate and regular rhythm.  Respiratory: Effort normal and breath sounds normal to anterior ascultation GI: Soft.  No distension. There is no tenderness.  Skin: WDI  Neuro: Mental Status: Patient is awake, alert, oriented to person, place, month, year, and situation. Patient is able to give a clear and coherent history. No signs of aphasia or neglect Cranial Nerves: II: Visual Fields are full. Pupils are equal, round, and reactive to light.   III,IV, VI: EOMI without ptosis or diploplia.  V: Facial sensation is symmetric to temperature VII: Facial movement is symmetric.  VIII: hearing is intact to voice X:  Uvula elevates symmetrically XI: Shoulder shrug is symmetric. XII: tongue is midline without atrophy or fasciculations.  Motor: Tone is normal. Bulk is normal. 5/5 strength was present in all four extremities.  Sensory: Sensation is symmetric to light touch and temperature in the arms and legs. Cerebellar: FNF intact bilaterally      I have reviewed labs in epic and the results pertinent to this consultation are: CMP from admission-unremarkable  I have reviewed the images obtained: MRI brain-unremarkable  Impression: 76 year old female with 2 episodes concerning for seizure. Given that she has had 2 episodes, as well as a left temporal focal  dysfunction I do think starting antiepileptic therapy is indicated at this time.  Recommendations: 1) Keppra 500 mg twice a day 2) she can follow-up with neurology as an outpatient, given that [place], could consider following up with Dr. Gerilyn Pilgrimoonquah.  3) I discussed no driving with her for 6 months. 4) no further recommendations at this time, neurology will sign off. Please call with any further questions or concerns.  Ritta SlotMcNeill Kirkpatrick, MD [facility] [phone]  If 7pm- 7am, please page neurology on call as listed in AMION.

## 2016-02-07 DIAGNOSIS — R569 Unspecified convulsions: Secondary | ICD-10-CM | POA: Diagnosis not present

## 2016-02-07 DIAGNOSIS — I25810 Atherosclerosis of coronary artery bypass graft(s) without angina pectoris: Secondary | ICD-10-CM | POA: Diagnosis not present

## 2016-02-07 DIAGNOSIS — E119 Type 2 diabetes mellitus without complications: Secondary | ICD-10-CM

## 2016-02-07 LAB — GLUCOSE, CAPILLARY
GLUCOSE-CAPILLARY: 115 mg/dL — AB (ref 65–99)
Glucose-Capillary: 101 mg/dL — ABNORMAL HIGH (ref 65–99)

## 2016-02-07 LAB — BASIC METABOLIC PANEL
ANION GAP: 5 (ref 5–15)
BUN: 17 mg/dL (ref 6–20)
CALCIUM: 8.7 mg/dL — AB (ref 8.9–10.3)
CO2: 24 mmol/L (ref 22–32)
CREATININE: 0.75 mg/dL (ref 0.44–1.00)
Chloride: 112 mmol/L — ABNORMAL HIGH (ref 101–111)
Glucose, Bld: 142 mg/dL — ABNORMAL HIGH (ref 65–99)
Potassium: 3.5 mmol/L (ref 3.5–5.1)
SODIUM: 141 mmol/L (ref 135–145)

## 2016-02-07 LAB — MAGNESIUM: MAGNESIUM: 2.1 mg/dL (ref 1.7–2.4)

## 2016-02-07 MED ORDER — LEVETIRACETAM 500 MG PO TABS
500.0000 mg | ORAL_TABLET | Freq: Two times a day (BID) | ORAL | 0 refills | Status: DC
Start: 2016-02-07 — End: 2016-05-17

## 2016-02-07 NOTE — Progress Notes (Signed)
PROGRESS NOTE  [name]  KVQ:[id number] DOB: [birth year]  DOA: 02/05/2016 PCP: [name]   Brief Narrative:  [name] is a 76 y.o. female with medical history significant for prior Stroke, CAD/ Ischemic Cardiomyopathy with recovered EF, status post CABG, DM 2, Dyslipidemia and Hypertension.  She was brought into the ED by EMS, presenting with a days history of an episode of jerking movements of her extremities with unresponsiveness associated with forming at the mouth and tongue sticking out. Her eyes were said to be "rolling up in the back of her head". There was no bowel or bladder incontinence. She had been in her usual state of health until she went to bed the night before presentation, and did not recall the event. She had been in bed throughout the event and did not sustain any traumatic injury.  She has had 2 episodes of seizure-like activity with postictal phase in the last 6 months with witnessed tonic-clonic movements noted with her last episode, status post normal carotid duplex and stable echocardiogram as well as event monitor in July 2017 for concerns of possible syncope. She has history of previous CVA without any stroke-like symptoms.  Evaluation in the ED revealed negative chest x-ray and CT scan of the head. A1c was 6.2% with hypokalemia' lactic acid level was initially elevated-normalized subsequently.  Assessment & Plan:   Principal Problem:   Seizure [facility](HCC) Active Problems:   Status post CVA   Coronary artery disease   Hypertension   Hyperlipidemia   Diabetes mellitus (HCC)   S/P CABG (coronary artery bypass graft)   Ischemic cardiomyopathy  1. Seizures: New.Marland Kitchen. CT/MRI brain negative EEG abnormal - neurologic consulted. Past seizure medication initiated.  2. Hypokalemia: Potassium repletion  3. CAD status post CABG/ischemic cardiomyopathy: Echo August 2017 indicated EF of 60-65% without any regional wall motion abnormality. Medical  management for now- continue beta blocker, antiplatelet and statin. Diuretic on hold.  4. Diabetes mellitus: Controlled with A1c of 6.2%.  Continue oral hypoglycemic with CBG checks.      DVT prophylaxis: Lovenox  Code Status: Full code  Family Communication:  Disposition Plan: Home   Consultants:  Neurology - S. Eshraghi   Procedures:  EEG, 02/06/2016- abnormal - occasional focal slowing over the left temporal region  Antimicrobials:  None    Subjective: No seizure activity or any other acute events noted overnight.   Objective:  Vitals:   02/06/16 2228 02/07/16 0200 02/07/16 0626 02/07/16 0938  BP: 124/62 (!) 98/51 116/60 (!) 122/54  Pulse: 67 (!) 52 (!) 59 65  Resp: 20 18 16 16   Temp: 98.5 F (36.9 C) 98.4 F (36.9 C) 98.9 F (37.2 C) 98.5 F (36.9 C)  TempSrc: Oral Oral Oral Oral  SpO2: 94% 96% 96% 97%  Weight:      Height:        Intake/Output Summary (Last 24 hours) at 02/07/16 1210 Last data filed at 02/06/16 1943  Gross per 24 hour  Intake          1268.75 ml  Output                0 ml  Net          1268.75 ml   Filed Weights   02/05/16 0327  Weight: 68 kg (150 lb)    Examination:  General exam: Resting comfortably, no acute distress  Respiratory system: Clear to auscultation. Respiratory effort normal. Cardiovascular system: S1 & S2 heard, RRR. No JVD, murmurs, rubs, gallops  or clicks. No pedal edema. Gastrointestinal system: Abdomen is nondistended, soft and nontender. No organomegaly or masses felt. Normal bowel sounds heard. Central nervous system: Alert and oriented. No focal neurological deficits. Extremities: Symmetric 5 x 5 power. Skin: No rashes, lesions or ulcers Psychiatry: Judgement and insight appear normal    Data Reviewed: I have personally reviewed following labs and imaging studies  CBC:  Recent Labs Lab 02/05/16 0415 02/05/16 0422 02/05/16 0554  WBC 5.3  --  9.9  NEUTROABS 4.4  --   --   HGB 7.6* 13.9 13.5   13.6  HCT 23.9* 41.0 41.9  41.6  MCV 85.4  --  84.0  PLT 91*  --  150   Basic Metabolic Panel:  Recent Labs Lab 02/05/16 0415 02/05/16 0422 02/05/16 1300 02/06/16 0250 02/07/16 0213  NA 139 141  --  142 141  K 3.8 3.6  --  2.7* 3.5  CL 105 103  --  111 112*  CO2 23  --   --  26 24  GLUCOSE 186* 179*  --  102* 142*  BUN 19 27*  --  16 17  CREATININE 0.70 0.70  --  0.69 0.75  CALCIUM 9.1  --   --  8.7* 8.7*  MG 1.8  --  1.8  --  2.1  PHOS  --   --  2.8  --   --    GFR: Estimated Creatinine Clearance: 55.3 mL/min (by C-G formula based on SCr of 0.75 mg/dL). Liver Function Tests:  Recent Labs Lab 02/05/16 0415 02/06/16 0250  AST 62* 30  ALT 36 26  ALKPHOS 81 72  BILITOT 1.4* 0.9  PROT 6.1* 5.7*  ALBUMIN 3.6 3.1*   No results for input(s): LIPASE, AMYLASE in the last 168 hours. No results for input(s): AMMONIA in the last 168 hours. Coagulation Profile:  Recent Labs Lab 02/05/16 0415  INR 0.92   Cardiac Enzymes:  Recent Labs Lab 02/05/16 0554  CKTOTAL 212   BNP (last 3 results) No results for input(s): PROBNP in the last 8760 hours. HbA1C:  Recent Labs  02/05/16 0737  HGBA1C 6.2*   CBG:  Recent Labs Lab 02/06/16 1109 02/06/16 1614 02/06/16 2231 02/07/16 0631 02/07/16 1157  GLUCAP 102* 95 87 115* 101*   Lipid Profile: No results for input(s): CHOL, HDL, LDLCALC, TRIG, CHOLHDL, LDLDIRECT in the last 72 hours. Thyroid Function Tests: No results for input(s): TSH, T4TOTAL, FREET4, T3FREE, THYROIDAB in the last 72 hours. Anemia Panel: No results for input(s): VITAMINB12, FOLATE, FERRITIN, TIBC, IRON, RETICCTPCT in the last 72 hours.  Sepsis Labs:  Recent Labs Lab 02/05/16 0415 02/05/16 0422 02/05/16 0554 02/05/16 0737 02/05/16 1300 02/05/16 1519  WBC 5.3  --  9.9  --   --   --   LATICACIDVEN  --  2.33*  --  2.2* 1.5 1.7    Recent Results (from the past 240 hour(s))  Urine culture     Status: Abnormal   Collection Time:  02/05/16  4:17 AM  Result Value Ref Range Status   Specimen Description URINE, CATHETERIZED  Final   Special Requests NONE  Final   Culture MULTIPLE SPECIES PRESENT, SUGGEST RECOLLECTION (A)  Final   Report Status 02/06/2016 FINAL  Final         Radiology Studies: No results found.      Scheduled Meds: . amLODipine  5 mg Oral Daily  . aspirin EC  81 mg Oral Daily  . enoxaparin (LOVENOX) injection  40 mg Subcutaneous Q24H  . Ferrous Fumarate  106 mg of iron Oral Daily  . glimepiride  2 mg Oral Daily  . insulin aspart  0-5 Units Subcutaneous QHS  . insulin aspart  0-9 Units Subcutaneous TID WC  . levETIRAcetam  500 mg Oral BID  . metoprolol  50 mg Oral BID  . ramipril  10 mg Oral BID  . rosuvastatin  20 mg Oral q1800   Continuous Infusions: . sodium chloride Stopped (02/05/16 1442)  . sodium chloride Stopped (02/06/16 1943)     LOS: 0 days    Time spent: 58    OSEI-BONSU,Jayanth Szczesniak, MD [facility] Pager [phone]  If 7PM-7AM, please contact night-coverage [email or web address] Password [email or web address] 02/07/2016, 12:10 PM

## 2016-02-07 NOTE — Discharge Summary (Signed)
[name], is a 76 y.o. female  DOB 1940-02-02  MRN [id number].  Admission date:  02/05/2016  Admitting Physician  Eduard Clos, MD  Discharge Date:  02/07/2016   Primary MD  Lilia Argue  Recommendations for primary care physician for things to follow:  Hydrochlorothiazide was discontinued due to recurrent hypokalemia. Needs assessment of blood pressures as well as potassium levels as outpatient. Patient started on Keppra - consider checking Keppra levels. Patient needs continued education on seizure precautions. Patient is referred to neurologist of choice.   Admission Diagnosis  Seizure (HCC) [R56.9] Symptomatic anemia [D64.9] Syncope, unspecified syncope type [R55]   Discharge Diagnosis  Seizure (HCC) [R56.9] Symptomatic anemia [D64.9] Syncope, unspecified syncope type [R55]   Principal Problem:   Seizure (HCC) Active Problems:   Status post CVA   Coronary artery disease   Hypertension   Hyperlipidemia   Diabetes mellitus (HCC)   S/P CABG (coronary artery bypass graft)   Ischemic cardiomyopathy      Past Medical History:  Diagnosis Date  . Coronary artery disease    a. 09/2007 s/p NSTEMI;  b. 04/2008 s/p CABG x 4 (LIMA->LAD, VG->D1, VG->RI, VG->dRCA).  . Diabetes mellitus   . Hyperlipidemia   . Hypertension   . Ischemic cardiomyopathy    a. 2010 EF 35-40%;  b. 09/2009 EF 65-70%.  . Mitral regurgitation    a.04/2008 s/p MVR @ time of CABG w/ a 30mm Edwards Physio II annuloplasty ring (model # [id number], ser # [id number]).  . Non-ST elevation MI (NSTEMI) (HCC) 09/2007   hx of  . PFO (patent foramen ovale)    a. 04/2008 s/p PFO closure @ time of CABG.  . Seizure (HCC)    a. 07/2014 ED visit for seizure vs syncope.  . Sepsis following intra-abdominal surgery (HCC)   . Status post CVA     Past Surgical History:  Procedure Laterality Date  . CARDIAC CATHETERIZATION   04/08/2008  . CORONARY ARTERY BYPASS GRAFT  2010   lima graft to lad,saphenous vein graft tothe diagonal,saphenous vein graft to the intermediate branch  and saphenous vein graft to the distal right coronary  . MITRAL VALVE REPAIR    . status post partial colectomy         HPI  from the history and physical done on the day of admission:    HPI: [name] is a 76 y.o. female with medical history significant for CAD status post CABG, history of prior ischemic cardiomyopathy with recovered EF, diabetes on oral agents, this up of dyslipidemia, hypertension and prior stroke. EMS was called to the patient's home by her husband after he awakened noting the wife was having jerking movements, her hands or raise her tongue was out. Unclear if post ictal phase. Upon arrival of EMS patient was alert and oriented to follow commands with CBG 173, BP 128/73 and heart rate 103. She had similar episode in July and was sent to the ER for evaluation. According to the documentation the husband was  awakened with a real loud snoring sound which was uncommon for her. He was unable to wake in the patient. Her tongue was sticking out and she was foaming at the mouth. She was unresponsive and he attempted to sit her up and she was unable to sit up. He described her "eyes rolling up in the back of her head". She apparently was breathing hard. Patient was evaluated by her cardiologist for routine follow-up and based on the symptoms there were concerns that patient may have sleep apnea syndrome with overnight pulse oximetry study was obtained that was within normal limits without evidence of hypoxemia.  ED Course:  Vital Signs: BP (!) 120/51   Pulse 69   Temp 98.1 F (36.7 C) (Oral)   Resp 20   Ht 5\' 1"  (1.549 m)   Wt 68 kg (150 lb)   SpO2 92%   BMI 28.34 kg/m  CT head without contrast: No acute intracranial abnormality 2 view chest x-ray: Mild stable cardiomegaly with pulmonary venous hypertension. Lab data:  Sodium 139, potassium 3.8, CO2 23, glucose 186, BUN 19, creatinine 0.7, AST 62, total bilirubin 1.4, alkaline phosphatase normal. Lactic acid 2.33, white count 9900 differential not obtained, hemoglobin 13.6, platelets 150,000, coags normal, urinalysis cloudy in appearance with trace leukocytes otherwise normal, alcohol level less than 5, urine drug screen negative, urine culture pending. Medications and treatments: None     Hospital Course:    Brief Narrative:  [name] is a 76 y.o.femalewith medical history significant for prior Stroke, CAD/ Ischemic Cardiomyopathy with recovered EF, status post CABG, DM 2, Dyslipidemia and Hypertension.  She was brought into the ED by EMS, presenting with a days history of an episode of jerking movements of her extremities with unresponsiveness associated with forming at the mouth and tongue sticking out. Her eyes were said to be "rolling up in the back of her head". There was no bowel or bladder incontinence. She had been in her usual state of health until she went to bed the night before presentation, and did not recall the event. She had been in bed throughout the event and did not sustain any traumatic injury.  She has had 2 episodes of seizure-like activity with postictal phase in the last 6 months with witnessed tonic-clonic movements noted with her last episode, status post normal carotid duplex and stable echocardiogram as well as event monitor in July 2017 for concerns of possible syncope. She has history of previous CVA without any stroke-like symptoms.  Evaluation in the ED revealed negative chest x-ray and CT scan of the head. A1c was 6.2% with hypokalemia' lactic acid level was initially elevated-normalized subsequently.  1. Seizures: New.Marland Kitchen. CT/MRI brain negative EEG abnormal - neurologic consulted. Right initiated 500 mg 2 times daily  2. Hypokalemia: Potassium repleted. Hydrochlorothiazide was held. Blood pressure remains  controlled.  3. CAD status post CABG/ischemic cardiomyopathy: Echo August 2017 indicated EF of 60-65% without any regional wall motion abnormality. Medical management for now- continue beta blocker, antiplatelet and statin. Diuretic on hold.  4. Diabetes mellitus: Controlled with A1c of 6.2%.  Continue oral hypoglycemic     Discharge Condition: Home  Follow UP- PCP within one week. Neurology referral  Follow-up Information    KAPLAN,KRISTEN, PA-C. Schedule an appointment as soon as possible for a visit in 1 week(s).   Specialty:  Family Medicine Why:  PCP TO REFER NEUROLOGY, [facility] OR NEUROLOGIST OF CHOICE Contact information: [address] [place] [address] [phone]  Consults obtained -Neurologist, Dr. Amada Jupiter  Diet and Activity recommendation:  As advised  Discharge Instructions     Discharge Instructions    Call MD for:  extreme fatigue    Complete by:  As directed    Call MD for:  persistant dizziness or light-headedness    Complete by:  As directed    Call MD for:  temperature >100.4    Complete by:  As directed    Diet - low sodium heart healthy    Complete by:  As directed    Discharge instructions    Complete by:  As directed    Per [facility] statutes, patients with seizures are not allowed to drive until they have been seizure-free for six months. Use caution when using heavy equipment or power tools. Avoid working on ladders or at heights. Take showers instead of baths. Ensure the water temperature is not too high on the home water heater. Do not go swimming alone. When caring for infants or small children, sit down when holding, feeding, or changing them to minimize risk of injury to the child in the event you have a seizure.   Increase activity slowly    Complete by:  As directed         Discharge Medications     Allergies as of 02/07/2016   No Known Allergies     Medication List    STOP taking  these medications   hydrochlorothiazide 25 MG tablet Commonly known as:  HYDRODIURIL     TAKE these medications   amLODipine 5 MG tablet Commonly known as:  NORVASC Take 1 tablet (5 mg total) by mouth daily.   aspirin EC 81 MG tablet Take 1 tablet (81 mg total) by mouth daily.   glimepiride 2 MG tablet Commonly known as:  AMARYL Take 2 mg by mouth daily.   IRON PO Take 27 mg by mouth daily.   levETIRAcetam 500 MG tablet Commonly known as:  KEPPRA Take 1 tablet (500 mg total) by mouth 2 (two) times daily.   metoprolol 50 MG tablet Commonly known as:  LOPRESSOR TAKE ONE TABLET BY MOUTH TWICE DAILY   Potassium 99 MG Tabs Take 1 tablet by mouth daily.   ramipril 10 MG capsule Commonly known as:  ALTACE Take 1 capsule (10 mg total) by mouth 2 (two) times daily.   rosuvastatin 20 MG tablet Commonly known as:  CRESTOR Take 1 tablet (20 mg total) by mouth daily.       Major procedures and Radiology Reports - PLEASE review detailed and final reports for all details, in brief -     Dg Chest 2 View  Result Date: 02/05/2016 CLINICAL DATA:  76 y/o  F; altered mental status. EXAM: CHEST  2 VIEW COMPARISON:  08/13/2015 chest radiograph FINDINGS: Stable mildly enlarged cardiac silhouette. Aortic atherosclerosis with calcification. Post median sternotomy, CABG, and mitral annuloplasty. Pulmonary venous hypertension. No focal consolidation. No pleural effusion. Exaggerated thoracic kyphosis. No acute osseous abnormality is evident. IMPRESSION: Mild stable cardiomegaly.  Pulmonary venous hypertension. Electronically Signed   By: Mitzi Hansen M.D.   On: 02/05/2016 04:10   Ct Head Wo Contrast  Result Date: 02/05/2016 CLINICAL DATA:  Altered mental status. EXAM: CT HEAD WITHOUT CONTRAST TECHNIQUE: Contiguous axial images were obtained from the base of the skull through the vertex without intravenous contrast. COMPARISON:  Head CT 08/13/2015 FINDINGS: Brain: No evidence of  acute infarction, hemorrhage, hydrocephalus, extra-axial collection or mass lesion/mass effect. Remote right  cerebellar infarct. Generalized atrophy and chronic small vessel ischemia, stable. Vascular: Atherosclerosis of skullbase vasculature without hyperdense vessel or abnormal calcification. Skull: Normal. Negative for fracture or focal lesion. Sinuses/Orbits: Chronic mucous retention cyst in the left maxillary sinus is unchanged. No acute sinus inflammation. No acute orbital abnormality. Other: None. IMPRESSION: 1.  No acute intracranial abnormality. 2. Stable atrophy, chronic small vessel ischemia, and remote right cerebellar infarct. Electronically Signed   By: Rubye Oaks M.D.   On: 02/05/2016 03:46   Mr Laqueta Jean ZO Contrast  Result Date: 02/05/2016 CLINICAL DATA:  Seizure. EXAM: MRI HEAD WITHOUT AND WITH CONTRAST TECHNIQUE: Multiplanar, multiecho pulse sequences of the brain and surrounding structures were obtained without and with intravenous contrast. CONTRAST:  10mL MULTIHANCE GADOBENATE DIMEGLUMINE 529 MG/ML IV SOLN COMPARISON:  CT head without contrast 02/05/2016 FINDINGS: Brain: A remote inferior right cerebellar infarct is again seen. Mild atrophy and white matter changes are otherwise within normal limits for age and without significant interval change. The ventricles are proportionate to the degree of atrophy. No significant extra-axial fluid collection is present. The brainstem and cerebellum are otherwise unremarkable. Dedicated imaging of the temporal lobes demonstrate symmetric size and signal of the hippocampal structures. The postcontrast images demonstrate no pathologic enhancement. Vascular: Flow is present in the major intracranial arteries. Skull and upper cervical spine: The skullbase is within normal limits. Midline sagittal structures are unremarkable. Craniocervical junction is within normal limits. The marrow signal is within normal limits. Sinuses/Orbits: A large polyp or  mucous retention cyst is again noted within the left maxillary sinus. There is mild mucosal thickening in the left maxillary sinus as well. No fluid levels are present. The remaining paranasal sinuses and the mastoid air cells are within normal limits bilaterally. The globes and orbits are normal. IMPRESSION: 1. No acute or focal abnormality to explain seizure. 2. Remote a right cerebellar infarct. 3. Otherwise normal MRI appearance of the brain for age. 4. Bilateral maxillary sinus disease as described. Electronically Signed   By: Marin Roberts M.D.   On: 02/05/2016 12:05    ELECTROENCEPHALOGRAM REPORT  Date of Study: 02/05/2016  Patient's Name: [name] MRN: [id number] Date of Birth: Jul 21, 1940  Referring Provider: Junious Silk, NP  Clinical History: This is a 76 year old woman with jerking movements.  Medications: insulin aspart (novoLOG) injection 0-9 Units   LORazepam (ATIVAN) tablet 0.5 mg   Technical Summary: A multichannel digital EEG recording measured by the international 10-20 system with electrodes applied with paste and impedances below 5000 ohms performed in our laboratory with EKG monitoring in an awake and drowsy patient.  Hyperventilation was not performed. Photic stimulation was performed.  The digital EEG was referentially recorded, reformatted, and digitally filtered in a variety of bipolar and referential montages for optimal display.    Description: The patient is awake and drowsy during the recording.  During maximal wakefulness, there is a symmetric, medium voltage 9.5 Hz posterior dominant rhythm that attenuates with eye opening.  There is occasional focal theta and delta slowing over the left temporal region. During drowsiness, there is an increase in theta and delta slowing of the background, with shifting asymmetry over the bilateral temporal regions.  Deeper stages of sleep were not seen. Photic stimulation did not elicit any abnormalities.   There were no epileptiform discharges or electrographic seizures seen.    EKG lead was unremarkable.  Impression: This awake and drowsy EEG is abnormal due to occasional focal slowing over the left temporal region.  Clinical Correlation of the above findings indicates focal cerebral dysfunction over the left temporal region suggestive of underlying structural or physiologic abnormality. The absence of epileptiform discharges does not exclude a clinical diagnosis of epilepsy. Clinical correlation is advised.   Patrcia Dolly, M.D.    Recent Results (from the past 240 hour(s))  Urine culture     Status: Abnormal   Collection Time: 02/05/16  4:17 AM  Result Value Ref Range Status   Specimen Description URINE, CATHETERIZED  Final   Special Requests NONE  Final   Culture MULTIPLE SPECIES PRESENT, SUGGEST RECOLLECTION (A)  Final   Report Status 02/06/2016 FINAL  Final       Today   Subjective    [name] today has no complaints. She has not had any acute events overnight. She wants to go home. Neurology had signed off yesterday. Seizure medication given     Patient has been seen and examined prior to discharge   Objective   Blood pressure (!) 136/54, pulse 66, temperature 98 F (36.7 C), temperature source Oral, resp. rate 16, height 5\' 1"  (1.549 m), weight 68 kg (150 lb), SpO2 99 %.   Intake/Output Summary (Last 24 hours) at 02/07/16 1603 Last data filed at 02/06/16 1943  Gross per 24 hour  Intake           278.75 ml  Output                0 ml  Net           278.75 ml    Exam Gen:- Awake , Acute distress HEENT:- [place].AT,  moist mucous membranes Neck-Supple Neck,No JVD,  Lungs- clear to auscultation CV- S1, S2 normal Abd-  +ve B.Sounds, Abd Soft, No tenderness,    Extremity/Skin:- No cyanosis no clubbing Neurologic -alert and oriented 3 no acute focal deficits    Data  Review   CBC w Diff: Lab Results  Component Value Date   WBC 9.9 02/05/2016    HGB 13.6 02/05/2016   HGB 13.5 02/05/2016   HCT 41.6 02/05/2016   HCT 41.9 02/05/2016   PLT 150 02/05/2016   LYMPHOPCT 10 02/05/2016   MONOPCT 6 02/05/2016   EOSPCT 0 02/05/2016   BASOPCT 0 02/05/2016    CMP: Lab Results  Component Value Date   NA 141 02/07/2016   K 3.5 02/07/2016   CL 112 (H) 02/07/2016   CO2 24 02/07/2016   BUN 17 02/07/2016   CREATININE 0.75 02/07/2016   PROT 5.7 (L) 02/06/2016   ALBUMIN 3.1 (L) 02/06/2016   BILITOT 0.9 02/06/2016   ALKPHOS 72 02/06/2016   AST 30 02/06/2016   ALT 26 02/06/2016  .   Total Discharge time is about 33 minutes  OSEI-BONSU,Surya Schroeter M.D on 02/07/2016 at 4:03 PM  [facility]   Office  [phone]  Dragon dictation system was used to create this note, attempts have been made to correct errors, however presence of uncorrected errors is not a reflection quality of care provided

## 2016-05-17 ENCOUNTER — Encounter: Payer: Self-pay | Admitting: Neurology

## 2016-05-17 ENCOUNTER — Ambulatory Visit (INDEPENDENT_AMBULATORY_CARE_PROVIDER_SITE_OTHER): Payer: Medicare Other | Admitting: Neurology

## 2016-05-17 VITALS — BP 162/68 | HR 63 | Temp 98.6°F | Ht 61.0 in | Wt 162.0 lb

## 2016-05-17 DIAGNOSIS — G40009 Localization-related (focal) (partial) idiopathic epilepsy and epileptic syndromes with seizures of localized onset, not intractable, without status epilepticus: Secondary | ICD-10-CM | POA: Diagnosis not present

## 2016-05-17 MED ORDER — LEVETIRACETAM 500 MG PO TABS: 500.0000 mg | ORAL_TABLET | Freq: Two times a day (BID) | ORAL | 11 refills | Status: DC

## 2016-05-17 NOTE — Patient Instructions (Addendum)
1. Continue Keppra 500mg twice a day 2. Follow-up in 3 months, call for any changes  Seizure Precautions: 1. If medication has been prescribed for you to prevent seizures, take it exactly as directed.  Do not stop taking the medicine without talking to your doctor first, even if you have not had a seizure in a long time.   2. Avoid activities in which a seizure would cause danger to yourself or to others.  Don't operate dangerous machinery, swim alone, or climb in high or dangerous places, such as on ladders, roofs, or girders.  Do not drive unless your doctor says you may.  3. If you have any warning that you may have a seizure, lay down in a safe place where you can't hurt yourself.    4.  No driving for 6 months from last seizure, as per [place] state law.   Please refer to the following link on the [employer]'s website for more information: http://www.epilepsyfoundation.org/answerplace/Social/driving/drivingu.cfm   5.  Maintain good sleep hygiene. Avoid alcohol.  6.  Contact your doctor if you have any problems that may be related to the medicine you are taking.  7.  Call 911 and bring the patient back to the ED if:        A.  The seizure lasts longer than 5 minutes.       B.  The patient doesn't awaken shortly after the seizure  C.  The patient has new problems such as difficulty seeing, speaking or moving  D.  The patient was injured during the seizure  E.  The patient has a temperature over 102 F (39C)  F.  The patient vomited and now is having trouble breathing         

## 2016-05-17 NOTE — Progress Notes (Signed)
NEUROLOGY CONSULTATION NOTE  [name] MRN: [id number] DOB: 21-Aug-1940  Referring provider: Mady Gemma, PA-C Primary care provider: Mady Gemma, PA-C  Reason for consult:  seizure  Thank you for your kind referral of [name] for consultation of the above symptoms. Although her history is well known to you, please allow me to reiterate it for the purpose of our medical record. The patient was accompanied to the clinic by her husband who also provides collateral information. Records and images were personally reviewed where available.  HISTORY OF PRESENT ILLNESS: This is a pleasant 76 year old right-handed woman with a history of hypertension, hyperlipidemia, diabetes, CAD s/p MI, mitral valve repair and closure of patent foramen ovale in 2010, stroke with no residual deficits, presenting for evaluation of seizures. The first episode occurred in 01/04/2015 while in church, she was sitting and unresponsive with eyes rolled up for a few minutes. She vomited after. She did not seek medical attention at that time. On 08/13/15, she woke up her husband making a loud noise, her tongue was stuck out to one side, she was snorting and limp like a ragdoll for 5-6 minutes. No incontinence. She woke up with EMS in her house. She was brought to [facility] ER where CBC and CMP were unremarkable except for elevated blood glucose. Lactic acid was elevated 2.66. Head CT did not show any acute changes, there was an old right cerebellar infarct seen. She had a carotid ultrasound done which was reported as unremarkable. The third episode occurred on 02/05/16, she was again asleep and awakened her husband with jerking movements, she was rigid with her arms flexed forward lasting a few minutes, no tongue bite or incontinence. She was confused afterwards. Vital signs were normal. She had an EEG which showed occasional focal slowing over the left temporal region. I personally reviewed MRI brain with and  without contrast which did not show any acute changes. There was a remote inferior right cerebellar infarct, mild atrophy and chronic microvascular disease, hippocampi symmetric without abnormal signal or enhancement. She was discharged home on Keppra  BID with no further similar episodes since January. Her husband denies any significant changes except she is more talkative than she used to be, which he likes. She can be more combative when they have discussions, which is definitely a small personality change for her. They deny any staring/unresponsive episodes, olfactory/gustatory hallucinations, deja vu, rising epigastric sensation, focal numbness/tingling/weakness, myoclonic jerks. She denies any headaches, dizziness, diplopia, dysarthria/dysphagia, neck/back pain, bowel/bladder dysfunction. She feels her memory is pretty good, she feels she has more energy. She stopped driving 8 years ago. She denies any missed medications, she cooks without difficulties, her husband is in charge of bills. No difficulties with ADLs. She had a normal birth and early development.  There is no history of febrile convulsions, CNS infections such as meningitis/encephalitis, significant traumatic brain injury, neurosurgical procedures, or family history of seizures.   PAST MEDICAL HISTORY: Past Medical History:  Diagnosis Date  . Coronary artery disease    a. 09/2007 s/p NSTEMI;  b. 04/2008 s/p CABG x 4 (LIMA->LAD, VG->D1, VG->RI, VG->dRCA).  . Diabetes mellitus   . Hyperlipidemia   . Hypertension   . Ischemic cardiomyopathy    a. 2010 EF 35-40%;  b. 09/2009 EF 65-70%.  . Mitral regurgitation    a.04/2008 s/p MVR @ time of CABG w/ a 30mm Edwards Physio II annuloplasty ring (model # [id number], ser # [id number]).  . Non-ST elevation MI (NSTEMI) (  HCC) 09/2007   hx of  . PFO (patent foramen ovale)    a. 04/2008 s/p PFO closure @ time of CABG.  . Seizure (HCC)    a. 07/2014 ED visit for seizure vs syncope.  . Sepsis following  intra-abdominal surgery (HCC)   . Status post CVA     PAST SURGICAL HISTORY: Past Surgical History:  Procedure Laterality Date  . CARDIAC CATHETERIZATION  04/08/2008  . CORONARY ARTERY BYPASS GRAFT  2010   lima graft to lad,saphenous vein graft tothe diagonal,saphenous vein graft to the intermediate branch  and saphenous vein graft to the distal right coronary  . MITRAL VALVE REPAIR    . status post partial colectomy      MEDICATIONS: Current Outpatient Prescriptions on File Prior to Visit  Medication Sig Dispense Refill  . amLODipine (NORVASC) 5 MG tablet Take 1 tablet (5 mg total) by mouth daily. 30 tablet 11  . aspirin EC 81 MG tablet Take 1 tablet (81 mg total) by mouth daily.    Marland Kitchen glimepiride (AMARYL) 2 MG tablet Take 2 mg by mouth daily.     . IRON PO Take 27 mg by mouth daily.     Marland Kitchen levETIRAcetam (KEPPRA) 500 MG tablet Take 1 tablet (500 mg total) by mouth 2 (two) times daily. 60 tablet 0  . metoprolol (LOPRESSOR) 50 MG tablet TAKE ONE TABLET BY MOUTH TWICE DAILY 60 tablet 11  . Potassium 99 MG TABS Take 1 tablet by mouth daily.    . ramipril (ALTACE) 10 MG capsule Take 1 capsule (10 mg total) by mouth 2 (two) times daily. 60 capsule 11  . rosuvastatin (CRESTOR) 20 MG tablet Take 1 tablet (20 mg total) by mouth daily. 30 tablet 11   No current facility-administered medications on file prior to visit.     ALLERGIES: No Known Allergies  FAMILY HISTORY: Family History  Problem Relation Age of Onset  . Hypertension Mother   . Melanoma Mother   . Heart disease Father     PPM    SOCIAL HISTORY: Social History   Social History  . Marital status: Married    Spouse name: N/A  . Number of children: 3  . Years of education: N/A   Occupational History  . Not on file.   Social History Main Topics  . Smoking status: Never Smoker  . Smokeless tobacco: Never Used  . Alcohol use No  . Drug use: No  . Sexual activity: Not Currently   Other Topics Concern  . Not on  file   Social History Narrative  . No narrative on file    REVIEW OF SYSTEMS: Constitutional: No fevers, chills, or sweats, no generalized fatigue, change in appetite Eyes: No visual changes, double vision, eye pain Ear, nose and throat: No hearing loss, ear pain, nasal congestion, sore throat Cardiovascular: No chest pain, palpitations Respiratory:  No shortness of breath at rest or with exertion, wheezes GastrointestinaI: No nausea, vomiting, diarrhea, abdominal pain, fecal incontinence Genitourinary:  No dysuria, urinary retention or frequency Musculoskeletal:  No neck pain, back pain Integumentary: No rash, pruritus, skin lesions Neurological: as above Psychiatric: No depression, insomnia, anxiety Endocrine: No palpitations, fatigue, diaphoresis, mood swings, change in appetite, change in weight, increased thirst Hematologic/Lymphatic:  No anemia, purpura, petechiae. Allergic/Immunologic: no itchy/runny eyes, nasal congestion, recent allergic reactions, rashes  PHYSICAL EXAM: Vitals:   05/17/16 1036  BP: (!) 162/68  Pulse: 63  Temp: 98.6 F (37 C)   General: No acute distress  Head:  Normocephalic/atraumatic Eyes: Fundoscopic exam shows bilateral sharp discs, no vessel changes, exudates, or hemorrhages Neck: supple, no paraspinal tenderness, full range of motion Back: No paraspinal tenderness Heart: regular rate and rhythm Lungs: Clear to auscultation bilaterally. Vascular: No carotid bruits. Skin/Extremities: No rash, no edema Neurological Exam: Mental status: alert and oriented to person, place, and time, no dysarthria or aphasia, Fund of knowledge is appropriate.  Remote memory intact.  Attention and concentration are normal but could only give 1 letter correct spelling WORLD backward. Able to name objects and repeat phrases but with some difficulty with repetition.  MMSE - Mini Mental State Exam 05/25/2016  Orientation to time 5  Orientation to Place 5  Registration 3   Attention/ Calculation 1  Recall 1  Language- name 2 objects 2  Language- repeat 1  Language- follow 3 step command 3  Language- read & follow direction 1  Write a sentence 1  Copy design 1  Total score 24   Cranial nerves: CN I: not tested CN II: pupils equal, round and reactive to light, visual fields intact, fundi unremarkable. CN III, IV, VI:  full range of motion, no nystagmus, no ptosis CN V: facial sensation intact CN VII: upper and lower face symmetric CN VIII: hearing intact to finger rub CN IX, X: gag intact, uvula midline CN XI: sternocleidomastoid and trapezius muscles intact CN XII: tongue midline Bulk & Tone: normal, no fasciculations. Motor: 5/5 throughout with no pronator drift. Sensation: intact to light touch, cold, pin, vibration and joint position sense.  No extinction to double simultaneous stimulation.  Romberg test negative Deep Tendon Reflexes: +2 throughout, no ankle clonus Plantar responses: downgoing bilaterally Cerebellar: no incoordination on finger to nose, heel to shin. No dysdiadochokinesia Gait: narrow-based and steady, able to tandem walk adequately. 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 She had an episode of unresponsiveness in December 2016, then a nocturnal seizure in July 2017 and most recently January 2018. Her neurological exam is non-focal, MRI brain no acute changes, there is an old right cerebellar stroke. EEG had shown occasional left temporal slowing. Seizures suggestive of focal to bilateral tonic-clonic epilepsy, etiology unclear. She is now on Keppra  BID with no significant side effects.  [place] driving laws were discussed with the patient, and she knows to stop driving after a  seizure, until 6 months seizure-free. She does not drive. She will follow-up in 3 months and knows to call for any changes.   Thank you for allowing me to participate in the care of this patient. Please do not hesitate to call for any questions or concerns.   Patrcia Dolly, M.D.  CC: Mady Gemma, PA-C

## 2016-05-25 DIAGNOSIS — G40009 Localization-related (focal) (partial) idiopathic epilepsy and epileptic syndromes with seizures of localized onset, not intractable, without status epilepticus: Secondary | ICD-10-CM | POA: Insufficient documentation

## 2016-07-17 NOTE — Progress Notes (Signed)
[name] Date of Birth: 1940/07/12 Medical Record #[id number]  History of Present Illness: [name] is seen for  followup CAD. She has a history of coronary disease and is status post coronary bypass surgery in 2010. Prior to her bypass surgery her ejection fraction was 35%. Repeat echocardiogram in September of 2011 showed a normal ejection fraction of 65-70%. In July 2017 she was admitted with an episode of unresponsiveness. Her husband awoke from sleep and noted that she was making loud gurgling type sounds. When he turned on the light, her eyes were closed and tongue was hanging out of her mouth. She was not responsive. He called EMS. She remained unresponsive for approximately 20-25 minutes but by the time EMS arrived, she was more responsive though somewhat foggy. Her blood sugar was apparently in the 160s. She was also apparently hemodynamically stable. She was taken to the [place] where she was asymptomatic. Head CT was negative for acute findings and ECG and lab work were nonacute.  She was subsequently discharged home. Carotid dopplers and Echo were unremarkable. Event monitor was normal. Subsequent overnight oximetry as an outpatient was normal.   She was readmitted again in January 2018 with a similar episode. MRI was negative. EEG showed focal slowing over the left temporal lobe. Neurology felt her presentation was most consistent with a seizure and she was started on Keppra. HCTZ was stopped due to hypokalemia.   On follow up today she reports she is doing well. No recurrent spells since January. Tolerating medication well. No chest pain or dyspnea. Walking daily.   Current Outpatient Prescriptions on File Prior to Visit  Medication Sig Dispense Refill  . amLODipine (NORVASC) 5 MG tablet Take 1 tablet (5 mg total) by mouth daily. 30 tablet 11  . aspirin EC 81 MG tablet Take 1 tablet (81 mg total) by mouth daily.    Marland Kitchen glimepiride (AMARYL) 2 MG tablet Take 2 mg by mouth  daily.     Marland Kitchen glucose blood (ONE TOUCH ULTRA TEST) test strip TEST ONCE DAILY    . IRON PO Take 27 mg by mouth daily.     Marland Kitchen levETIRAcetam (KEPPRA) 500 MG tablet Take 1 tablet (500 mg total) by mouth 2 (two) times daily. 60 tablet 11  . metoprolol (LOPRESSOR) 50 MG tablet TAKE ONE TABLET BY MOUTH TWICE DAILY 60 tablet 11  . Potassium 99 MG TABS Take 1 tablet by mouth daily.    . ramipril (ALTACE) 10 MG capsule Take 1 capsule (10 mg total) by mouth 2 (two) times daily. 60 capsule 11  . rosuvastatin (CRESTOR) 20 MG tablet Take 1 tablet (20 mg total) by mouth daily. 30 tablet 11   No current facility-administered medications on file prior to visit.     No Known Allergies  Past Medical History:  Diagnosis Date  . Coronary artery disease    a. 09/2007 s/p NSTEMI;  b. 04/2008 s/p CABG x 4 (LIMA->LAD, VG->D1, VG->RI, VG->dRCA).  . Diabetes mellitus   . Hyperlipidemia   . Hypertension   . Ischemic cardiomyopathy    a. 2010 EF 35-40%;  b. 09/2009 EF 65-70%.  . Mitral regurgitation    a.04/2008 s/p MVR @ time of CABG w/ a 30mm Edwards Physio II annuloplasty ring (model # [id number], ser # [id number]).  . Non-ST elevation MI (NSTEMI) (HCC) 09/2007   hx of  . PFO (patent foramen ovale)    a. 04/2008 s/p PFO closure @ time of CABG.  . Seizure (  HCC)    a. 07/2014 ED visit for seizure vs syncope.  . Sepsis following intra-abdominal surgery (HCC)   . Status post CVA     Past Surgical History:  Procedure Laterality Date  . CARDIAC CATHETERIZATION  04/08/2008  . CORONARY ARTERY BYPASS GRAFT  2010   lima graft to lad,saphenous vein graft tothe diagonal,saphenous vein graft to the intermediate branch  and saphenous vein graft to the distal right coronary  . MITRAL VALVE REPAIR    . status post partial colectomy      History  Smoking Status  . Never Smoker  Smokeless Tobacco  . Never Used    History  Alcohol Use No    Family History  Problem Relation Age of Onset  . Hypertension Mother   .  Melanoma Mother   . Heart disease Father        PPM    Review of Systems: As noted in history of present illness.  All other systems were reviewed and are negative.  Physical Exam: BP (!) 146/70 (BP Location: Left Arm, Cuff Size: Normal)   Pulse 76   Ht 5\' 1"  (1.549 m)   Wt 165 lb (74.8 kg)   BMI 31.18 kg/m  She is a pleasant white female in no acute distress. Her HEENT exam is unremarkable.  Neck is without JVD, adenopathy, thyromegaly, or bruits. Lungs are clear. Cardiac exam reveals a regular rate and rhythm without gallop or murmur. Abdomen is soft and nontender without masses or bruits. She has good femoral and pedal pulses. She has no edema today. Skin is warm and dry. She is alert and oriented x3. Cranial nerves II through XII are intact.  LABORATORY DATA:  Lab Results  Component Value Date   WBC 9.9 02/05/2016   HGB 13.6 02/05/2016   HGB 13.5 02/05/2016   HCT 41.6 02/05/2016   HCT 41.9 02/05/2016   PLT 150 02/05/2016   GLUCOSE 142 (H) 02/07/2016   CHOL (H) 11/09/2007    201        ATP III CLASSIFICATION:  <200     mg/dL   Desirable  [id number]  mg/dL   Borderline High  >=045>=240    mg/dL   High   TRIG 409231 (H) 81/19/147810/23/2009   HDL 14 (L) 11/09/2007   LDLCALC (H) 11/09/2007    141        Total Cholesterol/HDL:CHD Risk Coronary Heart Disease Risk Table                     Men   Women  1/2 Average Risk   3.4   3.3   ALT 26 02/06/2016   AST 30 02/06/2016   NA 141 02/07/2016   K 3.5 02/07/2016   CL 112 (H) 02/07/2016   CREATININE 0.75 02/07/2016   BUN 17 02/07/2016   CO2 24 02/07/2016   TSH 5.471 Test methodology is 3rd generation TSH (H) 11/15/2007   INR 0.92 02/05/2016   HGBA1C 6.2 (H) 02/05/2016   Labs dated 02/18/16: normal BMET.  Echo: 09/01/15:Study Conclusions  - Left ventricle: The cavity size was normal. Wall thickness was   normal. Systolic function was normal. The estimated ejection   fraction was in the range of 60% to 65%. Wall motion was normal;   there  were no regional wall motion abnormalities. Left   ventricular diastolic function parameters were normal. - Mitral valve: Prior procedures included surgical repair. The   findings are consistent with mild stenosis. There  was mild   regurgitation. - Pulmonary arteries: Systolic pressure was mildly increased. PA   peak pressure: 32 mm Hg (S).  Assessment / Plan: 1. Coronary disease status post CABG in 2010. She remains asymptomatic. Last ejection fraction was normal in 2017. We will continue on aspirin, metoprolol, and statin therapy.   2. Seizure disorder. Now on Keppra without recurrence. Follow up with Neurology.  3. Hypertension. Blood pressure is fair. Patient reports better readings at home from 107-120 systolic.   I recommend continuing current  ramipril, amlodipine, and lopressor.   4. Hypercholesterolemia on chronic statin therapy. Recommend she have a lipid panel with her next visit with primary care. I don't see a lipid panel since Jan 2016.   I will follow up in one year.

## 2016-07-19 ENCOUNTER — Ambulatory Visit (INDEPENDENT_AMBULATORY_CARE_PROVIDER_SITE_OTHER): Payer: Medicare Other | Admitting: Cardiology

## 2016-07-19 ENCOUNTER — Encounter: Payer: Self-pay | Admitting: Cardiology

## 2016-07-19 VITALS — BP 146/70 | HR 76 | Ht 61.0 in | Wt 165.0 lb

## 2016-07-19 DIAGNOSIS — Z951 Presence of aortocoronary bypass graft: Secondary | ICD-10-CM | POA: Diagnosis not present

## 2016-07-19 DIAGNOSIS — E7800 Pure hypercholesterolemia, unspecified: Secondary | ICD-10-CM | POA: Diagnosis not present

## 2016-07-19 DIAGNOSIS — I25810 Atherosclerosis of coronary artery bypass graft(s) without angina pectoris: Secondary | ICD-10-CM

## 2016-07-19 DIAGNOSIS — I10 Essential (primary) hypertension: Secondary | ICD-10-CM | POA: Diagnosis not present

## 2016-07-19 NOTE — Patient Instructions (Addendum)
Continue your current therapy  I will see you in one year  Have Dr. Arlyce DiceKaplan check your cholesterol

## 2016-08-25 ENCOUNTER — Ambulatory Visit (INDEPENDENT_AMBULATORY_CARE_PROVIDER_SITE_OTHER): Payer: Medicare Other | Admitting: Neurology

## 2016-08-25 ENCOUNTER — Encounter: Payer: Self-pay | Admitting: Neurology

## 2016-08-25 VITALS — BP 162/76 | HR 64 | Ht 61.0 in | Wt 167.0 lb

## 2016-08-25 DIAGNOSIS — G40009 Localization-related (focal) (partial) idiopathic epilepsy and epileptic syndromes with seizures of localized onset, not intractable, without status epilepticus: Secondary | ICD-10-CM

## 2016-08-25 MED ORDER — LEVETIRACETAM 500 MG PO TABS: 500.0000 mg | ORAL_TABLET | Freq: Two times a day (BID) | ORAL | 11 refills | Status: DC

## 2016-08-25 NOTE — Patient Instructions (Addendum)
1. Continue Keppra 500mg twice a day  2. Follow-up in 6 months, call for any changes   Seizure Precautions: 1. If medication has been prescribed for you to prevent seizures, take it exactly as directed.  Do not stop taking the medicine without talking to your doctor first, even if you have not had a seizure in a long time.   2. Avoid activities in which a seizure would cause danger to yourself or to others.  Don't operate dangerous machinery, swim alone, or climb in high or dangerous places, such as on ladders, roofs, or girders.  Do not drive unless your doctor says you may.  3. If you have any warning that you may have a seizure, lay down in a safe place where you can't hurt yourself.    4.  No driving for 6 months from last seizure, as per [place] state law.   Please refer to the following link on the [employer]'s website for more information: http://www.epilepsyfoundation.org/answerplace/Social/driving/drivingu.cfm   5.  Maintain good sleep hygiene. Avoid alcohol.  6.  Contact your doctor if you have any problems that may be related to the medicine you are taking.  7.  Call 911 and bring the patient back to the ED if:        A.  The seizure lasts longer than 5 minutes.       B.  The patient doesn't awaken shortly after the seizure  C.  The patient has new problems such as difficulty seeing, speaking or moving  D.  The patient was injured during the seizure  E.  The patient has a temperature over 102 F (39C)  F.  The patient vomited and now is having trouble breathing         

## 2016-08-25 NOTE — Progress Notes (Signed)
NEUROLOGY FOLLOW UP OFFICE NOTE  [name] [id number] 03-19-1940  HISTORY OF PRESENT ILLNESS: I had the pleasure of seeing [name] in follow-up in the neurology clinic on 08/25/2016.  The patient was last seen 3 months ago for recurrent seizures. She is again accompanied by her husband who helps supplement the history today. She continues to do well on Keppra 500mg  BID without seizures since January 2018, no side effects. They deny any staring/unresponsive episodes, olfactory/gustatory hallucinations, deja vu, rising epigastric sensation, focal numbness/tingling/weakness, myoclonic jerks. She denies any headaches, dizziness, diplopia, dysarthria/dysphagia, neck/back pain, bowel/bladder dysfunction. No falls. Mood is good.  HPI 05/17/2016: This is a pleasant 76 yo RH woman with a history of hypertension, hyperlipidemia, diabetes, CAD s/p MI, mitral valve repair and closure of patent foramen ovale in 2010, stroke with no residual deficits, with seizures. The first episode occurred in 01/04/2015 while in church, she was sitting and unresponsive with eyes rolled up for a few minutes. She vomited after. She did not seek medical attention at that time. On 08/13/15, she woke up her husband making a loud noise, her tongue was stuck out to one side, she was snorting and limp like a ragdoll for 5-6 minutes. No incontinence. She woke up with EMS in her house. She was brought to [facility] ER where CBC and CMP were unremarkable except for elevated blood glucose. Lactic acid was elevated 2.66. Head CT did not show any acute changes, there was an old right cerebellar infarct seen. She had a carotid ultrasound done which was reported as unremarkable. The third episode occurred on 02/05/16, she was again asleep and awakened her husband with jerking movements, she was rigid with her arms flexed forward lasting a few minutes, no tongue bite or incontinence. She was confused afterwards. Vital signs were normal. She  had an EEG which showed occasional focal slowing over the left temporal region. I personally reviewed MRI brain with and without contrast which did not show any acute changes. There was a remote inferior right cerebellar infarct, mild atrophy and chronic microvascular disease, hippocampi symmetric without abnormal signal or enhancement. She was discharged home on Keppra 500mg  BID with no further similar episodes since January. Her husband denies any significant changes except she is more talkative than she used to be, which he likes. She can be more combative when they have discussions, which is definitely a small personality change for her. She feels her memory is pretty good, she feels she has more energy. She stopped driving 8 years ago. She denies any missed medications, she cooks without difficulties, her husband is in charge of bills. No difficulties with ADLs. She had a normal birth and early development.  There is no history of febrile convulsions, CNS infections such as meningitis/encephalitis, significant traumatic brain injury, neurosurgical procedures, or family history of seizures.   PAST MEDICAL HISTORY: Past Medical History:  Diagnosis Date  . Coronary artery disease    a. 09/2007 s/p NSTEMI;  b. 04/2008 s/p CABG x 4 (LIMA->LAD, VG->D1, VG->RI, VG->dRCA).  . Diabetes mellitus   . Hyperlipidemia   . Hypertension   . Ischemic cardiomyopathy    a. 2010 EF 35-40%;  b. 09/2009 EF 65-70%.  . Mitral regurgitation    a.04/2008 s/p MVR @ time of CABG w/ a 30mm Edwards Physio II annuloplasty ring (model # [id number], ser # [id number]).  . Non-ST elevation MI (NSTEMI) (HCC) 09/2007   hx of  . PFO (patent foramen ovale)    a. 04/2008  s/p PFO closure @ time of CABG.  . Seizure (HCC)    a. 07/2014 ED visit for seizure vs syncope.  . Sepsis following intra-abdominal surgery (HCC)   . Status post CVA     MEDICATIONS: Current Outpatient Prescriptions on File Prior to Visit  Medication Sig Dispense Refill    . amLODipine (NORVASC) 5 MG tablet Take 1 tablet (5 mg total) by mouth daily. 30 tablet 11  . aspirin EC 81 MG tablet Take 1 tablet (81 mg total) by mouth daily.    Marland Kitchen glimepiride (AMARYL) 2 MG tablet Take 2 mg by mouth daily.     Marland Kitchen glucose blood (ONE TOUCH ULTRA TEST) test strip TEST ONCE DAILY    . IRON PO Take 27 mg by mouth daily.     Marland Kitchen levETIRAcetam (KEPPRA) 500 MG tablet Take 1 tablet (500 mg total) by mouth 2 (two) times daily. 60 tablet 11  . metoprolol (LOPRESSOR) 50 MG tablet TAKE ONE TABLET BY MOUTH TWICE DAILY 60 tablet 11  . Potassium 99 MG TABS Take 1 tablet by mouth daily.    . ramipril (ALTACE) 10 MG capsule Take 1 capsule (10 mg total) by mouth 2 (two) times daily. 60 capsule 11  . rosuvastatin (CRESTOR) 20 MG tablet Take 1 tablet (20 mg total) by mouth daily. 30 tablet 11   No current facility-administered medications on file prior to visit.     ALLERGIES: No Known Allergies  FAMILY HISTORY: Family History  Problem Relation Age of Onset  . Hypertension Mother   . Melanoma Mother   . Heart disease Father        PPM    SOCIAL HISTORY: Social History   Social History  . Marital status: Married    Spouse name: N/A  . Number of children: 3  . Years of education: N/A   Occupational History  . Not on file.   Social History Main Topics  . Smoking status: Never Smoker  . Smokeless tobacco: Never Used  . Alcohol use No  . Drug use: No  . Sexual activity: Not Currently   Other Topics Concern  . Not on file   Social History Narrative  . No narrative on file    REVIEW OF SYSTEMS: Constitutional: No fevers, chills, or sweats, no generalized fatigue, change in appetite Eyes: No visual changes, double vision, eye pain Ear, nose and throat: No hearing loss, ear pain, nasal congestion, sore throat Cardiovascular: No chest pain, palpitations Respiratory:  No shortness of breath at rest or with exertion, wheezes GastrointestinaI: No nausea, vomiting, diarrhea,  abdominal pain, fecal incontinence Genitourinary:  No dysuria, urinary retention or frequency Musculoskeletal:  No neck pain, back pain Integumentary: No rash, pruritus, skin lesions Neurological: as above Psychiatric: No depression, insomnia, anxiety Endocrine: No palpitations, fatigue, diaphoresis, mood swings, change in appetite, change in weight, increased thirst Hematologic/Lymphatic:  No anemia, purpura, petechiae. Allergic/Immunologic: no itchy/runny eyes, nasal congestion, recent allergic reactions, rashes  PHYSICAL EXAM: Vitals:   08/25/16 1141  BP: (!) 162/76  Pulse: 64   General: No acute distress Head:  Normocephalic/atraumatic Neck: supple, no paraspinal tenderness, full range of motion Heart:  Regular rate and rhythm Lungs:  Clear to auscultation bilaterally Back: No paraspinal tenderness Skin/Extremities: No rash, no edema Neurological Exam: alert and oriented to person, place, and time. No aphasia or dysarthria. Fund of knowledge is appropriate.  Recent and remote memory are intact.  Attention and concentration are normal.    Able to name objects  and repeat phrases. Cranial nerves: Pupils equal, round, reactive to light.  Extraocular movements intact with no nystagmus. Visual fields full. Facial sensation intact. No facial asymmetry. Tongue, uvula, palate midline.  Motor: Bulk and tone normal, muscle strength 5/5 throughout with no pronator drift.  Sensation to light touch intact.  No extinction to double simultaneous stimulation.  Deep tendon reflexes +1 throughout, toes downgoing.  Finger to nose testing intact.  Gait narrow-based and steady, able to tandem walk adequately.  Romberg negative.  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 She had  an episode of unresponsiveness in December 2016, then a nocturnal seizure in July 2017 and most recently January 2018. Her neurological exam is non-focal, MRI brain no acute changes, there is an old right cerebellar stroke. EEG had shown occasional left temporal slowing. Seizures suggestive of focal to bilateral tonic-clonic epilepsy, etiology unclear. She has been seizure-free since January 2018 on Keppra 500mg  BID with no significant side effects. Refills sent. She does not drive. She will follow-up in 6 months and knows to call for any changes.   Thank you for allowing me to participate in her care.  Please do not hesitate to call for any questions or concerns.  The duration of this appointment visit was 15 minutes of face-to-face time with the patient.  Greater than 50% of this time was spent in counseling, explanation of diagnosis, planning of further management, and coordination of care.   Patrcia DollyKaren Aquino, M.D.   CC: Mady GemmaKristen Kaplan, PA-C

## 2016-12-28 ENCOUNTER — Other Ambulatory Visit: Payer: Self-pay | Admitting: Cardiology

## 2016-12-28 NOTE — Telephone Encounter (Signed)
REFILL 

## 2016-12-29 ENCOUNTER — Telehealth: Payer: Self-pay | Admitting: Cardiology

## 2016-12-29 MED ORDER — RAMIPRIL 10 MG PO CAPS: 10.0000 mg | ORAL_CAPSULE | Freq: Two times a day (BID) | ORAL | 11 refills | Status: DC

## 2016-12-29 MED ORDER — METOPROLOL TARTRATE 50 MG PO TABS: ORAL_TABLET | ORAL | 11 refills | Status: DC

## 2016-12-29 MED ORDER — ROSUVASTATIN CALCIUM 20 MG PO TABS: 20.0000 mg | ORAL_TABLET | Freq: Every day | ORAL | 11 refills | Status: DC

## 2016-12-29 NOTE — Telephone Encounter (Signed)
[facility]

## 2016-12-29 NOTE — Telephone Encounter (Signed)
°  New message ° °Pt verbalized that she is returning call for the rn  °

## 2017-03-01 ENCOUNTER — Encounter: Payer: Self-pay | Admitting: Neurology

## 2017-03-01 ENCOUNTER — Ambulatory Visit: Payer: Medicare Other | Admitting: Neurology

## 2017-03-01 ENCOUNTER — Other Ambulatory Visit: Payer: Self-pay

## 2017-03-01 VITALS — BP 162/80 | HR 65 | Ht 61.0 in | Wt 169.0 lb

## 2017-03-01 DIAGNOSIS — G40009 Localization-related (focal) (partial) idiopathic epilepsy and epileptic syndromes with seizures of localized onset, not intractable, without status epilepticus: Secondary | ICD-10-CM | POA: Diagnosis not present

## 2017-03-01 MED ORDER — LEVETIRACETAM 500 MG PO TABS: 500.0000 mg | ORAL_TABLET | Freq: Two times a day (BID) | ORAL | 11 refills | Status: DC

## 2017-03-01 NOTE — Patient Instructions (Signed)
1. Continue Keppra 500mg twice a day 2. Follow-up in 1 year, call for any changes  Seizure Precautions: 1. If medication has been prescribed for you to prevent seizures, take it exactly as directed.  Do not stop taking the medicine without talking to your doctor first, even if you have not had a seizure in a long time.   2. Avoid activities in which a seizure would cause danger to yourself or to others.  Don't operate dangerous machinery, swim alone, or climb in high or dangerous places, such as on ladders, roofs, or girders.  Do not drive unless your doctor says you may.  3. If you have any warning that you may have a seizure, lay down in a safe place where you can't hurt yourself.    4.  No driving for 6 months from last seizure, as per [place] state law.   Please refer to the following link on the [employer]'s website for more information: http://www.epilepsyfoundation.org/answerplace/Social/driving/drivingu.cfm   5.  Maintain good sleep hygiene. Avoid alcohol.  6.  Contact your doctor if you have any problems that may be related to the medicine you are taking.  7.  Call 911 and bring the patient back to the ED if:        A.  The seizure lasts longer than 5 minutes.       B.  The patient doesn't awaken shortly after the seizure  C.  The patient has new problems such as difficulty seeing, speaking or moving  D.  The patient was injured during the seizure  E.  The patient has a temperature over 102 F (39C)  F.  The patient vomited and now is having trouble breathing         

## 2017-03-01 NOTE — Progress Notes (Signed)
NEUROLOGY FOLLOW UP OFFICE NOTE  [name] [id number] [birth year]  HISTORY OF PRESENT ILLNESS: I had the pleasure of seeing [name] in follow-up in the neurology clinic on 03/01/2017.  The patient was last seen 6 months ago for recurrent seizures. She is again accompanied by her husband who helps supplement the history today. She continues to do well on Keppra 500mg  BID without seizures since January 2018, no side effects. They deny any staring/unresponsive episodes, olfactory/gustatory hallucinations, deja vu, rising epigastric sensation, focal numbness/tingling/weakness, myoclonic jerks. She denies any significant headaches, dizziness, diplopia, dysarthria/dysphagia, neck/back pain, bowel/bladder dysfunction. No falls. Mood is good. She asks about snoring, she has refreshing sleep, but when she sits down she would fall asleep. Her husband reports two incidents where she fell asleep on the couch and was difficult to rouse, but was not confused when she finally woke up. When she is active, she does not feel drowsy. She had a 30-day holter and overnight O2 done with her cardiologist which was normal. Unclear if she had a sleep study done.   HPI 05/17/2016: This is a pleasant 77 yo RH woman with a history of hypertension, hyperlipidemia, diabetes, CAD s/p MI, mitral valve repair and closure of patent foramen ovale in 2010, stroke with no residual deficits, with seizures. The first episode occurred in 01/04/2015 while in church, she was sitting and unresponsive with eyes rolled up for a few minutes. She vomited after. She did not seek medical attention at that time. On 08/13/15, she woke up her husband making a loud noise, her tongue was stuck out to one side, she was snorting and limp like a ragdoll for 5-6 minutes. No incontinence. She woke up with EMS in her house. She was brought to [facility] ER where CBC and CMP were unremarkable except for elevated blood glucose. Lactic acid was elevated 2.66.  Head CT did not show any acute changes, there was an old right cerebellar infarct seen. She had a carotid ultrasound done which was reported as unremarkable. The third episode occurred on 02/05/16, she was again asleep and awakened her husband with jerking movements, she was rigid with her arms flexed forward lasting a few minutes, no tongue bite or incontinence. She was confused afterwards. Vital signs were normal. She had an EEG which showed occasional focal slowing over the left temporal region. I personally reviewed MRI brain with and without contrast which did not show any acute changes. There was a remote inferior right cerebellar infarct, mild atrophy and chronic microvascular disease, hippocampi symmetric without abnormal signal or enhancement. She was discharged home on Keppra 500mg  BID with no further similar episodes since January. Her husband denies any significant changes except she is more talkative than she used to be, which he likes. She can be more combative when they have discussions, which is definitely a small personality change for her. She feels her memory is pretty good, she feels she has more energy. She stopped driving 8 years ago. She denies any missed medications, she cooks without difficulties, her husband is in charge of bills. No difficulties with ADLs. She had a normal birth and early development.  There is no history of febrile convulsions, CNS infections such as meningitis/encephalitis, significant traumatic brain injury, neurosurgical procedures, or family history of seizures.   PAST MEDICAL HISTORY: Past Medical History:  Diagnosis Date  . Coronary artery disease    a. 09/2007 s/p NSTEMI;  b. 04/2008 s/p CABG x 4 (LIMA->LAD, VG->D1, VG->RI, VG->dRCA).  . Diabetes  mellitus   . Hyperlipidemia   . Hypertension   . Ischemic cardiomyopathy    a. 2010 EF 35-40%;  b. 09/2009 EF 65-70%.  . Mitral regurgitation    a.04/2008 s/p MVR @ time of CABG w/ a 30mm Edwards Physio II  annuloplasty ring (model # [id number], ser # [id number]).  . Non-ST elevation MI (NSTEMI) (HCC) 09/2007   hx of  . PFO (patent foramen ovale)    a. 04/2008 s/p PFO closure @ time of CABG.  . Seizure (HCC)    a. 07/2014 ED visit for seizure vs syncope.  . Sepsis following intra-abdominal surgery   . Status post CVA     MEDICATIONS: Current Outpatient Medications on File Prior to Visit  Medication Sig Dispense Refill  . amLODipine (NORVASC) 5 MG tablet TAKE ONE TABLET BY MOUTH ONCE DAILY 90 tablet 3  . aspirin EC 81 MG tablet Take 1 tablet (81 mg total) by mouth daily.    Marland Kitchen glimepiride (AMARYL) 2 MG tablet Take 2 mg by mouth daily.     Marland Kitchen glucose blood (ONE TOUCH ULTRA TEST) test strip TEST ONCE DAILY    . IRON PO Take 27 mg by mouth daily.     Marland Kitchen levETIRAcetam (KEPPRA) 500 MG tablet Take 1 tablet (500 mg total) by mouth 2 (two) times daily. 60 tablet 11  . metoprolol tartrate (LOPRESSOR) 50 MG tablet TAKE ONE TABLET BY MOUTH TWICE DAILY 60 tablet 11  . Potassium 99 MG TABS Take 1 tablet by mouth daily.    . ramipril (ALTACE) 10 MG capsule Take 1 capsule (10 mg total) by mouth 2 (two) times daily. 60 capsule 11  . rosuvastatin (CRESTOR) 20 MG tablet Take 1 tablet (20 mg total) by mouth daily. 30 tablet 11   No current facility-administered medications on file prior to visit.     ALLERGIES: No Known Allergies  FAMILY HISTORY: Family History  Problem Relation Age of Onset  . Hypertension Mother   . Melanoma Mother   . Heart disease Father        PPM    SOCIAL HISTORY: Social History   Socioeconomic History  . Marital status: Married    Spouse name: Not on file  . Number of children: 3  . Years of education: Not on file  . Highest education level: Not on file  Social Needs  . Financial resource strain: Not on file  . Food insecurity - worry: Not on file  . Food insecurity - inability: Not on file  . Transportation needs - medical: Not on file  . Transportation needs - non-medical:  Not on file  Occupational History  . Not on file  Tobacco Use  . Smoking status: Never Smoker  . Smokeless tobacco: Never Used  Substance and Sexual Activity  . Alcohol use: No  . Drug use: No  . Sexual activity: Not Currently  Other Topics Concern  . Not on file  Social History Narrative  . Not on file    REVIEW OF SYSTEMS: Constitutional: No fevers, chills, or sweats, no generalized fatigue, change in appetite Eyes: No visual changes, double vision, eye pain Ear, nose and throat: No hearing loss, ear pain, nasal congestion, sore throat Cardiovascular: No chest pain, palpitations Respiratory:  No shortness of breath at rest or with exertion, wheezes GastrointestinaI: No nausea, vomiting, diarrhea, abdominal pain, fecal incontinence Genitourinary:  No dysuria, urinary retention or frequency Musculoskeletal:  No neck pain, back pain Integumentary: No rash, pruritus, skin lesions Neurological:  as above Psychiatric: No depression, insomnia, anxiety Endocrine: No palpitations, fatigue, diaphoresis, mood swings, change in appetite, change in weight, increased thirst Hematologic/Lymphatic:  No anemia, purpura, petechiae. Allergic/Immunologic: no itchy/runny eyes, nasal congestion, recent allergic reactions, rashes  PHYSICAL EXAM: Vitals:   03/01/17 1434  BP: (!) 162/80  Pulse: 65  SpO2: 98%   General: No acute distress Head:  Normocephalic/atraumatic Neck: supple, no paraspinal tenderness, full range of motion Heart:  Regular rate and rhythm Lungs:  Clear to auscultation bilaterally Back: No paraspinal tenderness Skin/Extremities: No rash, no edema Neurological Exam: alert and oriented to person, place, and time. No aphasia or dysarthria. Fund of knowledge is appropriate.  Recent and remote memory are intact.  Attention and concentration are normal.    Able to name objects and repeat phrases. Cranial nerves: Pupils equal, round, reactive to light.  Extraocular movements intact  with no nystagmus. Visual fields full. Facial sensation intact. No facial asymmetry. Tongue, uvula, palate midline.  Motor: Bulk and tone normal, muscle strength 5/5 throughout with no pronator drift.  Sensation to light touch intact.  No extinction to double simultaneous stimulation.  Deep tendon reflexes +1 throughout, toes downgoing.  Finger to nose testing intact.  Gait narrow-based and steady, able to tandem walk adequately.  Romberg negative.  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 She had an episode of unresponsiveness in December 2016, then a nocturnal seizure in July 2017 and most recently January 2018. Her neurological exam is non-focal, MRI brain no acute changes, there is an old right cerebellar stroke. EEG had shown occasional left temporal slowing. Seizures suggestive of focal to bilateral tonic-clonic epilepsy, etiology unclear. She continues to do well with no seizures since January 2018 on Keppra 500mg  BID, no side effects. Refills sent. [place] laws were discussed, she knows to stop driving until 6 months seizure-free. They ask about snoring and ?daytime drowsiness when not stimulated, it is unclear if she had a sleep study, but if not done, this may be considered with her PCP. She will follow-up in 1 year and knows to call for any changes.   Thank you for allowing me to participate in her care.  Please do not hesitate to call for any questions or concerns.  The duration of this appointment visit was 15 minutes of face-to-face time with the patient.  Greater than 50% of this time was spent in counseling, explanation of diagnosis, planning of further management, and coordination of care.   Patrcia DollyKaren Vanita Cannell, M.D.   CC: Mady GemmaKristen Kaplan, PA-C

## 2017-06-06 ENCOUNTER — Encounter: Payer: Self-pay | Admitting: Cardiology

## 2017-06-24 NOTE — Progress Notes (Signed)
Park PopeShirley H Lopez Date of Birth: 1940/12/28 Medical Record #[id number]  History of Present Illness: [name] is seen for  followup CAD. She has a history of coronary disease and is status post coronary bypass surgery in 2010. Prior to her bypass surgery her ejection fraction was 35%. Repeat echocardiogram in September of 2011 showed a normal ejection fraction of 65-70%. In July 2017 she was admitted with an episode of unresponsiveness. Her husband awoke from sleep and noted that she was making loud gurgling type sounds. When he turned on the light, her eyes were closed and tongue was hanging out of her mouth. She was not responsive. He called EMS. She remained unresponsive for approximately 20-25 minutes but by the time EMS arrived, she was more responsive though somewhat foggy. Her blood sugar was apparently in the 160s. She was also apparently hemodynamically stable. She was taken to the [facility] where she was asymptomatic. Head CT was negative for acute findings and ECG and lab work were nonacute.  She was subsequently discharged home. Carotid dopplers and Echo were unremarkable. Event monitor was normal. Subsequent overnight oximetry as an outpatient was normal.   She was readmitted again in January 2018 with a similar episode. MRI was negative. EEG showed focal slowing over the left temporal lobe. Neurology felt her presentation was most consistent with a seizure and she was started on Keppra. HCTZ was stopped due to hypokalemia.   On follow up today she reports she is doing well. Just turned 77.  She has had no further seizure episodes. No chest pain or dyspnea. BP has been well controlled. Does complain of feeling sleepy at times.   Current Outpatient Medications on File Prior to Visit  Medication Sig Dispense Refill  . amLODipine (NORVASC) 5 MG tablet TAKE ONE TABLET BY MOUTH ONCE DAILY 90 tablet 3  . aspirin EC 81 MG tablet Take 1 tablet (81 mg total) by mouth daily.    Marland Kitchen. glimepiride  (AMARYL) 2 MG tablet Take 2 mg by mouth daily.     Marland Kitchen. glucose blood (ONE TOUCH ULTRA TEST) test strip TEST ONCE DAILY    . IRON PO Take 27 mg by mouth daily.     Marland Kitchen. levETIRAcetam (KEPPRA) 500 MG tablet Take 1 tablet (500 mg total) by mouth 2 (two) times daily. 60 tablet 11  . metoprolol tartrate (LOPRESSOR) 50 MG tablet TAKE ONE TABLET BY MOUTH TWICE DAILY 60 tablet 11  . Potassium 99 MG TABS Take 1 tablet by mouth daily.    . ramipril (ALTACE) 10 MG capsule Take 1 capsule (10 mg total) by mouth 2 (two) times daily. 60 capsule 11  . rosuvastatin (CRESTOR) 20 MG tablet Take 1 tablet (20 mg total) by mouth daily. 30 tablet 11   No current facility-administered medications on file prior to visit.     No Known Allergies  Past Medical History:  Diagnosis Date  . Coronary artery disease    a. 09/2007 s/p NSTEMI;  b. 04/2008 s/p CABG x 4 (LIMA->LAD, VG->D1, VG->RI, VG->dRCA).  . Diabetes mellitus   . Hyperlipidemia   . Hypertension   . Ischemic cardiomyopathy    a. 2010 EF 35-40%;  b. 09/2009 EF 65-70%.  . Mitral regurgitation    a.04/2008 s/p MVR @ time of CABG w/ a 30mm Edwards Physio II annuloplasty ring (model # [id number], ser # [id number]).  . Non-ST elevation MI (NSTEMI) (HCC) 09/2007   hx of  . PFO (patent foramen ovale)  a. 04/2008 s/p PFO closure @ time of CABG.  . Seizure (HCC)    a. 07/2014 ED visit for seizure vs syncope.  . Sepsis following intra-abdominal surgery (HCC)   . Status post CVA     Past Surgical History:  Procedure Laterality Date  . CARDIAC CATHETERIZATION  04/08/2008  . CORONARY ARTERY BYPASS GRAFT  2010   lima graft to lad,saphenous vein graft tothe diagonal,saphenous vein graft to the intermediate branch  and saphenous vein graft to the distal right coronary  . MITRAL VALVE REPAIR    . status post partial colectomy      Social History   Tobacco Use  Smoking Status Never Smoker  Smokeless Tobacco Never Used    Social History   Substance and Sexual Activity    Alcohol Use No    Family History  Problem Relation Age of Onset  . Hypertension Mother   . Melanoma Mother   . Heart disease Father        PPM    Review of Systems: As noted in history of present illness.  All other systems were reviewed and are negative.  Physical Exam: BP 140/76   Pulse 70   Ht 5\' 1"  (1.549 m)   Wt 170 lb 9.6 oz (77.4 kg)   SpO2 97%   BMI 32.23 kg/m  GENERAL:  Well appearing WF in NAD HEENT:  PERRL, EOMI, sclera are clear. Oropharynx is clear. NECK:  No jugular venous distention, carotid upstroke brisk and symmetric, no bruits, no thyromegaly or adenopathy LUNGS:  Clear to auscultation bilaterally CHEST:  Unremarkable HEART:  RRR,  PMI not displaced or sustained,S1 and S2 within normal limits, no S3, no S4: no clicks, no rubs, no murmurs ABD:  Soft, nontender. BS +, no masses or bruits. No hepatomegaly, no splenomegaly EXT:  2 + pulses throughout, no edema, no cyanosis no clubbing SKIN:  Warm and dry.  No rashes NEURO:  Alert and oriented x 3. Cranial nerves II through XII intact. PSYCH:  Cognitively intact    LABORATORY DATA: Labs dated 02/18/16: normal BMET. Dated 02/20/17: A1c 6.8%. Glucose 200. Otherwise CMET normal. Cholesterol 137, triglycerides 233, HDL 33, LDL 69.   Ecg today shows NSR with LAD. Poor R wave progression. I have personally reviewed and interpreted this study.   Echo: 09/01/15:Study Conclusions  - Left ventricle: The cavity size was normal. Wall thickness was   normal. Systolic function was normal. The estimated ejection   fraction was in the range of 60% to 65%. Wall motion was normal;   there were no regional wall motion abnormalities. Left   ventricular diastolic function parameters were normal. - Mitral valve: Prior procedures included surgical repair. The   findings are consistent with mild stenosis. There was mild   regurgitation. - Pulmonary arteries: Systolic pressure was mildly increased. PA   peak pressure: 32 mm  Hg (S).  Assessment / Plan: 1. Coronary disease status post CABG in 2010. She remains asymptomatic. Ejection fraction was normal in 2017. We will continue on aspirin, metoprolol, and statin therapy.   2. Seizure disorder. Now on Keppra without recurrence. Followed by Neurology.  3. Hypertension. Blood pressure is under control.   I recommend continuing current  ramipril, amlodipine, and lopressor.   4. Hypercholesterolemia on chronic statin therapy. LDL is at goal < 70. Triglycerides are a little high. Focus on restriction of sweets and starches.  5. DM type 2. Per primary care.   I will follow up in one  year.

## 2017-06-28 ENCOUNTER — Other Ambulatory Visit: Payer: Self-pay

## 2017-06-28 ENCOUNTER — Encounter: Payer: Self-pay | Admitting: Cardiology

## 2017-06-28 ENCOUNTER — Ambulatory Visit: Payer: Medicare Other | Admitting: Cardiology

## 2017-06-28 VITALS — BP 140/76 | HR 70 | Ht 61.0 in | Wt 170.6 lb

## 2017-06-28 DIAGNOSIS — I25810 Atherosclerosis of coronary artery bypass graft(s) without angina pectoris: Secondary | ICD-10-CM | POA: Diagnosis not present

## 2017-06-28 DIAGNOSIS — I10 Essential (primary) hypertension: Secondary | ICD-10-CM | POA: Diagnosis not present

## 2017-06-28 DIAGNOSIS — Z951 Presence of aortocoronary bypass graft: Secondary | ICD-10-CM | POA: Diagnosis not present

## 2017-06-28 DIAGNOSIS — E7800 Pure hypercholesterolemia, unspecified: Secondary | ICD-10-CM | POA: Diagnosis not present

## 2017-06-28 MED ORDER — ROSUVASTATIN CALCIUM 20 MG PO TABS: 20.0000 mg | ORAL_TABLET | Freq: Every day | ORAL | 3 refills | Status: DC

## 2017-06-28 MED ORDER — AMLODIPINE BESYLATE 5 MG PO TABS: 5.0000 mg | ORAL_TABLET | Freq: Every day | ORAL | 3 refills | Status: DC

## 2017-06-28 MED ORDER — METOPROLOL TARTRATE 50 MG PO TABS: ORAL_TABLET | ORAL | 3 refills | Status: DC

## 2017-06-28 MED ORDER — RAMIPRIL 10 MG PO CAPS: 10.0000 mg | ORAL_CAPSULE | Freq: Two times a day (BID) | ORAL | 3 refills | Status: DC

## 2017-06-28 NOTE — Patient Instructions (Signed)
Continue your current therapy  Restrict intake of sweets and starches.  I will see you in one year

## 2018-03-02 ENCOUNTER — Other Ambulatory Visit: Payer: Self-pay

## 2018-03-02 ENCOUNTER — Ambulatory Visit: Payer: Medicare Other | Admitting: Neurology

## 2018-03-02 ENCOUNTER — Encounter

## 2018-03-02 ENCOUNTER — Encounter: Payer: Self-pay | Admitting: Neurology

## 2018-03-02 VITALS — BP 148/86 | HR 76 | Ht 61.0 in | Wt 172.0 lb

## 2018-03-02 DIAGNOSIS — G40009 Localization-related (focal) (partial) idiopathic epilepsy and epileptic syndromes with seizures of localized onset, not intractable, without status epilepticus: Secondary | ICD-10-CM | POA: Diagnosis not present

## 2018-03-02 MED ORDER — LEVETIRACETAM 500 MG PO TABS: 500.0000 mg | ORAL_TABLET | Freq: Two times a day (BID) | ORAL | 11 refills | Status: DC

## 2018-03-02 NOTE — Patient Instructions (Signed)
1. Continue Keppra 500mg twice a day 2. Follow-up in 1 year, call for any changes  Seizure Precautions: 1. If medication has been prescribed for you to prevent seizures, take it exactly as directed.  Do not stop taking the medicine without talking to your doctor first, even if you have not had a seizure in a long time.   2. Avoid activities in which a seizure would cause danger to yourself or to others.  Don't operate dangerous machinery, swim alone, or climb in high or dangerous places, such as on ladders, roofs, or girders.  Do not drive unless your doctor says you may.  3. If you have any warning that you may have a seizure, lay down in a safe place where you can't hurt yourself.    4.  No driving for 6 months from last seizure, as per [place] state law.   Please refer to the following link on the [employer]ile[employer] of [employer]'s website for more information: http://www.epilepsyfoundation.org/answerplace/Social/driving/drivingu.cfm   5.  Maintain good sleep hygiene. Avoid alcohol.  6.  Contact your doctor if you have any problems that may be related to the medicine you are taking.  7.  Call 911 and bring the patient back to the ED if:        A.  The seizure lasts longer than 5 minutes.       B.  The patient doesn't awaken shortly after the seizure  C.  The patient has new problems such as difficulty seeing, speaking or moving  D.  The patient was injured during the seizure  E.  The patient has a temperature over 102 F (39C)  F.  The patient vomited and now is having trouble breathing         

## 2018-03-02 NOTE — Progress Notes (Signed)
NEUROLOGY FOLLOW UP OFFICE NOTE  [name] [id number] 10/20/1940  HISTORY OF PRESENT ILLNESS: I had the pleasure of seeing [name] in follow-up in the neurology clinic on 03/02/2018.  The patient was last seen 1 year ago for recurrent seizures. She is again accompanied by her husband who helps supplement the history today. Since her last visit, she and her husband deny any seizures or seizure-like symptoms since January 2018. No side effects on Levetiracetam 500mg  BID. They deny any staring/unresponsive episodes, olfactory/gustatory hallucinations, deja vu, rising epigastric sensation, focal numbness/tingling/weakness, myoclonic jerks. She denies any headaches, dizziness, diplopia, dysarthria/dysphagia, neck/back pain, bowel/bladder dysfunction. No falls. Mood is good.   HPI 05/17/2016: This is a pleasant 78 yo RH woman with a history of hypertension, hyperlipidemia, diabetes, CAD s/p MI, mitral valve repair and closure of patent foramen ovale in 2010, stroke with no residual deficits, with seizures. The first episode occurred in 01/04/2015 while in church, she was sitting and unresponsive with eyes rolled up for a few minutes. She vomited after. She did not seek medical attention at that time. On 08/13/15, she woke up her husband making a loud noise, her tongue was stuck out to one side, she was snorting and limp like a ragdoll for 5-6 minutes. No incontinence. She woke up with EMS in her house. She was brought to [facility] ER where CBC and CMP were unremarkable except for elevated blood glucose. Lactic acid was elevated 2.66. Head CT did not show any acute changes, there was an old right cerebellar infarct seen. She had a carotid ultrasound done which was reported as unremarkable. The third episode occurred on 02/05/16, she was again asleep and awakened her husband with jerking movements, she was rigid with her arms flexed forward lasting a few minutes, no tongue bite or incontinence. She was  confused afterwards. Vital signs were normal. She had an EEG which showed occasional focal slowing over the left temporal region. I personally reviewed MRI brain with and without contrast which did not show any acute changes. There was a remote inferior right cerebellar infarct, mild atrophy and chronic microvascular disease, hippocampi symmetric without abnormal signal or enhancement. She was discharged home on Keppra 500mg  BID with no further similar episodes since January. Her husband denies any significant changes except she is more talkative than she used to be, which he likes. She can be more combative when they have discussions, which is definitely a small personality change for her. She feels her memory is pretty good, she feels she has more energy. She stopped driving 8 years ago. She denies any missed medications, she cooks without difficulties, her husband is in charge of bills. No difficulties with ADLs. She had a normal birth and early development.  There is no history of febrile convulsions, CNS infections such as meningitis/encephalitis, significant traumatic brain injury, neurosurgical procedures, or family history of seizures.   PAST MEDICAL HISTORY: Past Medical History:  Diagnosis Date  . Coronary artery disease    a. 09/2007 s/p NSTEMI;  b. 04/2008 s/p CABG x 4 (LIMA->LAD, VG->D1, VG->RI, VG->dRCA).  . Diabetes mellitus   . Hyperlipidemia   . Hypertension   . Ischemic cardiomyopathy    a. 2010 EF 35-40%;  b. 09/2009 EF 65-70%.  . Mitral regurgitation    a.04/2008 s/p MVR @ time of CABG w/ a 22mm Edwards Physio II annuloplasty ring (model # [id number], ser # [id number]).  . Non-ST elevation MI (NSTEMI) (HCC) 09/2007   hx of  . PFO (  patent foramen ovale)    a. 04/2008 s/p PFO closure @ time of CABG.  . Seizure (HCC)    a. 07/2014 ED visit for seizure vs syncope.  . Sepsis following intra-abdominal surgery (HCC)   . Status post CVA     MEDICATIONS: Current Outpatient Medications on File  Prior to Visit  Medication Sig Dispense Refill  . amLODipine (NORVASC) 5 MG tablet Take 1 tablet (5 mg total) by mouth daily. 90 tablet 3  . aspirin EC 81 MG tablet Take 1 tablet (81 mg total) by mouth daily.    Marland Kitchen. glimepiride (AMARYL) 2 MG tablet Take 2 mg by mouth daily.     Marland Kitchen. glucose blood (ONE TOUCH ULTRA TEST) test strip TEST ONCE DAILY    . IRON PO Take 27 mg by mouth daily.     Marland Kitchen. levETIRAcetam (KEPPRA) 500 MG tablet Take 1 tablet (500 mg total) by mouth 2 (two) times daily. 60 tablet 11  . metoprolol tartrate (LOPRESSOR) 50 MG tablet TAKE ONE TABLET BY MOUTH TWICE DAILY 180 tablet 3  . Potassium 99 MG TABS Take 1 tablet by mouth daily.    . ramipril (ALTACE) 10 MG capsule Take 1 capsule (10 mg total) by mouth 2 (two) times daily. 180 capsule 3  . rosuvastatin (CRESTOR) 20 MG tablet Take 1 tablet (20 mg total) by mouth daily. 90 tablet 3   No current facility-administered medications on file prior to visit.     ALLERGIES: No Known Allergies  FAMILY HISTORY: Family History  Problem Relation Age of Onset  . Hypertension Mother   . Melanoma Mother   . Heart disease Father        PPM    SOCIAL HISTORY: Social History   Socioeconomic History  . Marital status: Married    Spouse name: Not on file  . Number of children: 3  . Years of education: Not on file  . Highest education level: Not on file  Occupational History  . Not on file  Social Needs  . Financial resource strain: Not on file  . Food insecurity:    Worry: Not on file    Inability: Not on file  . Transportation needs:    Medical: Not on file    Non-medical: Not on file  Tobacco Use  . Smoking status: Never Smoker  . Smokeless tobacco: Never Used  Substance and Sexual Activity  . Alcohol use: No  . Drug use: No  . Sexual activity: Not Currently  Lifestyle  . Physical activity:    Days per week: Not on file    Minutes per session: Not on file  . Stress: Not on file  Relationships  . Social  connections:    Talks on phone: Not on file    Gets together: Not on file    Attends religious service: Not on file    Active member of club or organization: Not on file    Attends meetings of clubs or organizations: Not on file    Relationship status: Not on file  . Intimate partner violence:    Fear of current or ex partner: Not on file    Emotionally abused: Not on file    Physically abused: Not on file    Forced sexual activity: Not on file  Other Topics Concern  . Not on file  Social History Narrative  . Not on file    REVIEW OF SYSTEMS: Constitutional: No fevers, chills, or sweats, no generalized fatigue, change in  appetite Eyes: No visual changes, double vision, eye pain Ear, nose and throat: No hearing loss, ear pain, nasal congestion, sore throat Cardiovascular: No chest pain, palpitations Respiratory:  No shortness of breath at rest or with exertion, wheezes GastrointestinaI: No nausea, vomiting, diarrhea, abdominal pain, fecal incontinence Genitourinary:  No dysuria, urinary retention or frequency Musculoskeletal:  No neck pain, back pain Integumentary: No rash, pruritus, skin lesions Neurological: as above Psychiatric: No depression, insomnia, anxiety Endocrine: No palpitations, fatigue, diaphoresis, mood swings, change in appetite, change in weight, increased thirst Hematologic/Lymphatic:  No anemia, purpura, petechiae. Allergic/Immunologic: no itchy/runny eyes, nasal congestion, recent allergic reactions, rashes  PHYSICAL EXAM: Vitals:   03/02/18 1500  BP: (!) 148/86  Pulse: 76  SpO2: 97%   General: No acute distress Head:  Normocephalic/atraumatic Neck: supple, no paraspinal tenderness, full range of motion Heart:  Regular rate and rhythm Lungs:  Clear to auscultation bilaterally Back: No paraspinal tenderness Skin/Extremities: No rash, no edema Neurological Exam: alert and oriented to person, place, and time. No aphasia or dysarthria. Fund of knowledge  is appropriate.  Recent and remote memory are intact.  Attention and concentration are normal.    Able to name objects and repeat phrases. Cranial nerves: Pupils equal, round, reactive to light.  Extraocular movements intact with no nystagmus. Visual fields full. Facial sensation intact. No facial asymmetry. Tongue, uvula, palate midline.  Motor: Bulk and tone normal, muscle strength 5/5 throughout with no pronator drift.  Sensation to light touch intact.  No extinction to double simultaneous stimulation.  Deep tendon reflexes +1 throughout, toes downgoing.  Finger to nose testing intact.  Gait narrow-based and steady, able to tandem walk adequately.  Romberg negative.  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 She had an episode of unresponsiveness in December 2016, then a nocturnal seizure in July 2017 and January 2018. MRI brain no acute changes, there is an old right cerebellar stroke. EEG had shown occasional left temporal slowing. Seizures suggestive of focal to bilateral tonic-clonic epilepsy, etiology unclear. No seizures since January 2018, refills for Levetiracetam 500mg  BID sent. She is aware of [place] driving laws to stop driving after a seizure until 6 months seizure-free. Follow-up in 1 year, they know to call for any changes.   Thank you for allowing me to participate in her care.  Please do not hesitate to call for any questions or concerns.  The duration of this appointment visit was 15 minutes of face-to-face time with the patient.  Greater than 50% of this time was spent in counseling, explanation of diagnosis, planning of further management, and coordination of care.   Patrcia Dolly, M.D.   CC: Mady Gemma, PA-C

## 2018-05-31 ENCOUNTER — Other Ambulatory Visit: Payer: Self-pay | Admitting: Cardiology

## 2018-05-31 MED ORDER — ROSUVASTATIN CALCIUM 20 MG PO TABS: 20.0000 mg | ORAL_TABLET | Freq: Every day | ORAL | 0 refills | Status: DC

## 2018-05-31 NOTE — Telephone Encounter (Signed)
New Message    *STAT* If patient is at the pharmacy, call can be transferred to refill team.   1. Which medications need to be refilled? (please list name of each medication and dose if known) Rosuvastatin 20mg    2. Which pharmacy/location (including street and city if local pharmacy) is medication to be sent to? [facility] on [address]   3. Do they need a 30 day or 90 day supply? 90 day supply

## 2018-06-01 ENCOUNTER — Other Ambulatory Visit: Payer: Self-pay

## 2018-06-01 MED ORDER — ROSUVASTATIN CALCIUM 20 MG PO TABS: 20.0000 mg | ORAL_TABLET | Freq: Every day | ORAL | 0 refills | Status: DC

## 2018-06-18 ENCOUNTER — Other Ambulatory Visit: Payer: Self-pay

## 2018-06-18 MED ORDER — ROSUVASTATIN CALCIUM 20 MG PO TABS: 20.0000 mg | ORAL_TABLET | Freq: Every day | ORAL | 3 refills | Status: DC

## 2018-06-18 MED ORDER — AMLODIPINE BESYLATE 5 MG PO TABS: 5.0000 mg | ORAL_TABLET | Freq: Every day | ORAL | 3 refills | Status: DC

## 2018-06-18 MED ORDER — RAMIPRIL 10 MG PO CAPS: 10.0000 mg | ORAL_CAPSULE | Freq: Two times a day (BID) | ORAL | 3 refills | Status: DC

## 2018-06-18 MED ORDER — METOPROLOL TARTRATE 50 MG PO TABS: ORAL_TABLET | ORAL | 3 refills | Status: DC

## 2018-06-21 ENCOUNTER — Telehealth: Payer: Self-pay | Admitting: Cardiology

## 2018-06-21 ENCOUNTER — Encounter: Payer: Self-pay | Admitting: Cardiology

## 2018-06-21 MED ORDER — ROSUVASTATIN CALCIUM 20 MG PO TABS: 20.0000 mg | ORAL_TABLET | Freq: Every day | ORAL | 0 refills | Status: DC

## 2018-06-21 MED ORDER — RAMIPRIL 10 MG PO CAPS: 10.0000 mg | ORAL_CAPSULE | Freq: Two times a day (BID) | ORAL | 0 refills | Status: DC

## 2018-06-21 MED ORDER — AMLODIPINE BESYLATE 5 MG PO TABS: 5.0000 mg | ORAL_TABLET | Freq: Every day | ORAL | 0 refills | Status: DC

## 2018-06-21 NOTE — Telephone Encounter (Signed)
  Patient is upset because her appt got cancelled with Dr [place] and no one called to reschedule. I do not see any available appts. Please call

## 2018-06-21 NOTE — Telephone Encounter (Signed)
F/U Message          Patient got disconnected and she also wants her refills done     *STAT* If patient is at the pharmacy, call can be transferred to refill team.   1. Which medications need to be refilled? (please list name of each medication and dose if known) Metoprolol/Rosuvatatin/Ramipril/Amlodipine  2. Which pharmacy/location (including street and city if local pharmacy) is medication to be sent to?[facility] on [address]  3. Do they need a 30 day or 90 day supply? 90

## 2018-06-21 NOTE — Telephone Encounter (Signed)
error 

## 2018-06-21 NOTE — Telephone Encounter (Signed)
Returned call to pt/husbacd pt needs refills. Due for OV appt has been  Scheduled refills sent

## 2018-06-25 ENCOUNTER — Ambulatory Visit: Payer: Medicare Other | Admitting: Cardiology

## 2018-07-16 ENCOUNTER — Telehealth: Payer: Self-pay | Admitting: Physician Assistant

## 2018-07-16 NOTE — Telephone Encounter (Signed)
° ° °  Virtual Visit Pre-Appointment Phone Call 3. 1. m consent - "In the setting of the current Covid19 crisis, you are scheduled for a (phone or video) visit with your provider on (date) at (time).  Just as we do with many in-office visits, in order for you to participate in this visit, we must obtain consent.  If you'd like, I can send this to your mychart (if signed up) or email for you to review.  Otherwise, I can obtain your verbal consent now.  All virtual visits are billed to your insurance company just like a normal visit would be.  By agreeing to a virtual visit, we'd like you to understand that the technology does not allow for your provider to perform an examination, and thus may limit your provider's ability to fully assess your condition. If your provider identifies any concerns that need to be evaluated in person, we will make arrangements to do so.  Finally, though the technology is pretty good, we cannot assure that it will always work on either your or our end, and in the setting of a video visit, we may have to convert it to a phone-only visit.  In either situation, we cannot ensure that we have a secure connection.  Are you willing to proceed?" STAFF: Did the patient verbally acknowledge consent to telehealth visit? Document YES/NO here: Yes  [name] has been deemed a candidate for a follow-up tele-health visit to limit community exposure during the Covid-19 pandemic. I spoke with the patient via phone to ensure availability of phone/video source, confirm preferred email & phone number, and discuss instructions and expectations.  I reminded [name] to be prepared with any vital sign and/or heart rhythm information that could potentially be obtained via home monitoring, at the time of her visit. I reminded [name] to expect a phone call prior to her visit.  Glyn Ade 07/16/2018 3:47 PM       Pt gave me verbal consent  On 6_29-20 @3 :45 , pt was also  informed of her appt on6-30-20 with Almyra Deforest

## 2018-07-17 ENCOUNTER — Telehealth (INDEPENDENT_AMBULATORY_CARE_PROVIDER_SITE_OTHER): Payer: Medicare Other | Admitting: Medical

## 2018-07-17 ENCOUNTER — Encounter: Payer: Self-pay | Admitting: Physician Assistant

## 2018-07-17 VITALS — BP 150/76 | HR 65 | Ht 61.0 in | Wt 168.0 lb

## 2018-07-17 DIAGNOSIS — I10 Essential (primary) hypertension: Secondary | ICD-10-CM | POA: Diagnosis not present

## 2018-07-17 DIAGNOSIS — E785 Hyperlipidemia, unspecified: Secondary | ICD-10-CM | POA: Diagnosis not present

## 2018-07-17 DIAGNOSIS — I25810 Atherosclerosis of coronary artery bypass graft(s) without angina pectoris: Secondary | ICD-10-CM

## 2018-07-17 DIAGNOSIS — E119 Type 2 diabetes mellitus without complications: Secondary | ICD-10-CM

## 2018-07-17 DIAGNOSIS — Z951 Presence of aortocoronary bypass graft: Secondary | ICD-10-CM | POA: Diagnosis not present

## 2018-07-17 MED ORDER — METOPROLOL TARTRATE 50 MG PO TABS: ORAL_TABLET | ORAL | 3 refills | Status: DC

## 2018-07-17 MED ORDER — RAMIPRIL 10 MG PO CAPS: 10.0000 mg | ORAL_CAPSULE | Freq: Two times a day (BID) | ORAL | 3 refills | Status: DC

## 2018-07-17 MED ORDER — ROSUVASTATIN CALCIUM 20 MG PO TABS: 20.0000 mg | ORAL_TABLET | Freq: Every day | ORAL | 3 refills | Status: DC

## 2018-07-17 MED ORDER — AMLODIPINE BESYLATE 5 MG PO TABS: 7.5000 mg | ORAL_TABLET | Freq: Every day | ORAL | 3 refills | Status: DC

## 2018-07-17 NOTE — Progress Notes (Signed)
Virtual Visit via Telephone Note   This visit type was conducted due to national recommendations for restrictions regarding the COVID-19 Pandemic (e.g. social distancing) in an effort to limit this patient's exposure and mitigate transmission in our community.  Due to her co-morbid illnesses, this patient is at least at moderate risk for complications without adequate follow up.  This format is felt to be most appropriate for this patient at this time.  The patient did not have access to video technology/had technical difficulties with video requiring transitioning to audio format only (telephone).  All issues noted in this document were discussed and addressed.  No physical exam could be performed with this format.  Please refer to the patient's chart for her  consent to telehealth for [facility].   Date:  07/17/2018   ID:  [name], DOB [birth year], MRN [id number]  Patient Location: Home Provider Location: Office  PCP:  Richmond CampbellKaplan, Kristen W., PA-C  Cardiologist:  Peter SwazilandJordan, MD  Electrophysiologist:  None   Evaluation Performed:  Follow-Up Visit  Chief Complaint:  Follow-up of CAD  History of Present Illness:    [name] is a 78 y.o. female with a PMH of CAD s/p CABG in 2010, ischemic cardiomyopathy with recovery of EF (60-65% in 2017), HTN, HLD, MR s/p MVR at the time of CABG, seizure disorder, who presents via telemedicine for follow-up of her CAD.  She was last evaluated by cardiology at an outpatient visit with Dr. SwazilandJordan 06/28/2017 at which time she was doing well from a cardiac standpoint, only reporting some sleepiness at times. Her last echocardiogram was in 2017 and showed EF 60-65%, no RWMA, normal diastolic LV function, mild MS/MR, and mildly increased PA pressures. Her last ischemic evaluation was a LHC in 2010 prior to her CABG where she was found to have severe multivessel disease.   She presents today for routine follow-up. She reports doing well from  a cardiac standpoint since her last visit 06/2017. She continues to go for 2 walks a day and works outside. No complaints of chest pain at rest or with exertion, SOB, DOE, orthopnea, PND, dizziness, lightheadedness, or syncope. She reports occasional LE edema which is relieved with elevation. No recurrent seizures since 2018.   The patient does not have symptoms concerning for COVID-19 infection (fever, chills, cough, or new shortness of breath).    Past Medical History:  Diagnosis Date  . Coronary artery disease    a. 09/2007 s/p NSTEMI;  b. 04/2008 s/p CABG x 4 (LIMA->LAD, VG->D1, VG->RI, VG->dRCA).  . Diabetes mellitus   . Hyperlipidemia   . Hypertension   . Ischemic cardiomyopathy    a. 2010 EF 35-40%;  b. 09/2009 EF 65-70%.  . Mitral regurgitation    a.04/2008 s/p MVR @ time of CABG w/ a 30mm Edwards Physio II annuloplasty ring (model # [id number], ser # [id number]).  . Non-ST elevation MI (NSTEMI) (HCC) 09/2007   hx of  . PFO (patent foramen ovale)    a. 04/2008 s/p PFO closure @ time of CABG.  . Seizure (HCC)    a. 07/2014 ED visit for seizure vs syncope.  . Sepsis following intra-abdominal surgery (HCC)   . Status post CVA    Past Surgical History:  Procedure Laterality Date  . CARDIAC CATHETERIZATION  04/08/2008  . CORONARY ARTERY BYPASS GRAFT  2010   lima graft to lad,saphenous vein graft tothe diagonal,saphenous vein graft to the intermediate branch  and saphenous vein graft to the  distal right coronary  . MITRAL VALVE REPAIR    . status post partial colectomy       Current Meds  Medication Sig  . amLODipine (NORVASC) 5 MG tablet Take 1.5 tablets (7.5 mg total) by mouth daily.  Marland Kitchen aspirin EC 81 MG tablet Take 1 tablet (81 mg total) by mouth daily.  Marland Kitchen glimepiride (AMARYL) 2 MG tablet Take 2 mg by mouth daily.   Marland Kitchen glucose blood (ONE TOUCH ULTRA TEST) test strip TEST ONCE DAILY  . IRON PO Take 27 mg by mouth daily.   Marland Kitchen levETIRAcetam (KEPPRA) 500 MG tablet Take 1 tablet (500 mg total)  by mouth 2 (two) times daily.  . metoprolol tartrate (LOPRESSOR) 50 MG tablet TAKE ONE TABLET BY MOUTH TWICE DAILY  . Potassium 99 MG TABS Take 1 tablet by mouth daily.  . ramipril (ALTACE) 10 MG capsule Take 1 capsule (10 mg total) by mouth 2 (two) times daily.  . rosuvastatin (CRESTOR) 20 MG tablet Take 1 tablet (20 mg total) by mouth daily.  . [DISCONTINUED] amLODipine (NORVASC) 5 MG tablet Take 1 tablet (5 mg total) by mouth daily.  . [DISCONTINUED] metoprolol tartrate (LOPRESSOR) 50 MG tablet TAKE ONE TABLET BY MOUTH TWICE DAILY  . [DISCONTINUED] ramipril (ALTACE) 10 MG capsule Take 1 capsule (10 mg total) by mouth 2 (two) times daily.  . [DISCONTINUED] rosuvastatin (CRESTOR) 20 MG tablet Take 1 tablet (20 mg total) by mouth daily.     Allergies:   Patient has no known allergies.   Social History   Tobacco Use  . Smoking status: Never Smoker  . Smokeless tobacco: Never Used  Substance Use Topics  . Alcohol use: No  . Drug use: No     Family Hx: The patient's family history includes Heart disease in her father; Hypertension in her mother; Melanoma in her mother.  ROS:   Please see the history of present illness.     All other systems reviewed and are negative.  Prior CV studies:   The following studies were reviewed today:  Echocardiogram 05/2015: Study Conclusions  - Left ventricle: The cavity size was normal. Wall thickness was   normal. Systolic function was normal. The estimated ejection   fraction was in the range of 60% to 65%. Wall motion was normal;   there were no regional wall motion abnormalities. Left   ventricular diastolic function parameters were normal. - Mitral valve: Prior procedures included surgical repair. The   findings are consistent with mild stenosis. There was mild   regurgitation. - Pulmonary arteries: Systolic pressure was mildly increased. PA   peak pressure: 32 mm Hg (S).  Labs/Other Tests and Data Reviewed:    EKG:  No ECG reviewed.   Recent Labs: No results found for requested labs within last 8760 hours.   Recent Lipid Panel Lab Results  Component Value Date/Time   CHOL (H) 11/09/2007 12:31 PM    201        ATP III CLASSIFICATION:  <200     mg/dL   Desirable  200-239  mg/dL   Borderline High  >=240    mg/dL   High   TRIG 231 (H) 11/09/2007 12:31 PM   HDL 14 (L) 11/09/2007 12:31 PM   CHOLHDL 14.4 11/09/2007 12:31 PM   LDLCALC (H) 11/09/2007 12:31 PM    141        Total Cholesterol/HDL:CHD Risk Coronary Heart Disease Risk Table  Men   Women  1/2 Average Risk   3.4   3.3    Wt Readings from Last 3 Encounters:  07/17/18 168 lb (76.2 kg)  03/02/18 172 lb (78 kg)  06/28/17 170 lb 9.6 oz (77.4 kg)     Objective:    Vital Signs:  BP (!) 150/76   Pulse 65   Ht 5\' 1"  (1.549 m)   Wt 168 lb (76.2 kg)   BMI 31.74 kg/m    VITAL SIGNS:  reviewed GEN:  no acute distress RESPIRATORY:  able to speak in complete sentences without obvious SOB NEURO:  alert and oriented x 3, no obvious focal deficit PSYCH:  normal affect  ASSESSMENT & PLAN:    1. CAD s/p CABG in 2010: patient appears to be doing well from a cardiac standpoint. No anginal complaints. Remains physically active, walking two times per day.  - Continue aspirin and statin - Continue amlodipine and metoprolol - Continue to encourage 30 minutes of physical activity per day  2. HTN: BP elevated to 150/76 today. She reports SBP typically in the 140s.  - Will increase amlodipine to 7.5mg  daily - Continue ramipril and metoprolol tartrate  3. HLD: Followed by PCP. No recent lipids on file. She is planning to have blood work done next week and will ask that they be forwarded to Dr. SwazilandJordan.  - Continue crestor  4. DM Type 2: Followed by PCP. She does not recall her last A1C. - Continue glimepiride  COVID-19 Education: The signs and symptoms of COVID-19 were discussed with the patient and how to seek care for testing (follow up  with PCP or arrange E-visit).  The importance of social distancing was discussed today.  Time:   Today, I have spent 14 minutes with the patient with telehealth technology discussing the above problems.     Medication Adjustments/Labs and Tests Ordered: Current medicines are reviewed at length with the patient today. Concerns regarding medicines are outlined above.   Tests Ordered: No orders of the defined types were placed in this encounter.   Medication Changes: Meds ordered this encounter  Medications  . amLODipine (NORVASC) 5 MG tablet    Sig: Take 1.5 tablets (7.5 mg total) by mouth daily.    Dispense:  135 tablet    Refill:  3  . metoprolol tartrate (LOPRESSOR) 50 MG tablet    Sig: TAKE ONE TABLET BY MOUTH TWICE DAILY    Dispense:  180 tablet    Refill:  3  . ramipril (ALTACE) 10 MG capsule    Sig: Take 1 capsule (10 mg total) by mouth 2 (two) times daily.    Dispense:  180 capsule    Refill:  3  . rosuvastatin (CRESTOR) 20 MG tablet    Sig: Take 1 tablet (20 mg total) by mouth daily.    Dispense:  90 tablet    Refill:  3    Follow Up:  Virtual Visit or In Person in 1 year with Dr. SwazilandJordan, or sooner if problems arise.   Signed, Beatriz StallionKrista M. Kroeger, PA-C  07/17/2018 11:38 AM    [facility]

## 2018-07-17 NOTE — Patient Instructions (Signed)
Medication Instructions:   INCREASE AMLODIPINE TO 7.5 MG DAILY  If you need a refill on your cardiac medications before your next appointment, please call your pharmacy.   Lab work:  NONE ordered at this time of appointment   If you have labs (blood work) drawn today and your tests are completely normal, you will receive your results only by: Marland Kitchen MyChart Message (if you have MyChart) OR . A paper copy in the mail If you have any lab test that is abnormal or we need to change your treatment, we will call you to review the results.  Testing/Procedures:  NONE ordered at this time of appointment    Follow-Up: At [facility], you and your health needs are our priority.  As part of our continuing mission to provide you with exceptional heart care, we have created designated Provider Care Teams.  These Care Teams include your primary Cardiologist (physician) and Advanced Practice Providers (APPs -  Physician Assistants and Nurse Practitioners) who all work together to provide you with the care you need, when you need it. You will need a follow up appointment in 12 months (June 2021).  Please call our office in March/April 2021 to schedule this appointment.  You may see Peter Martinique, MD or one of the following Advanced Practice Providers on your designated Care Team: [place], [place] . Fabian Sharp, PA-C  Any Other Special Instructions Will Be Listed Below (If Applicable).

## 2018-10-16 ENCOUNTER — Telehealth (INDEPENDENT_AMBULATORY_CARE_PROVIDER_SITE_OTHER): Payer: Medicare Other | Admitting: Neurology

## 2018-10-16 ENCOUNTER — Encounter: Payer: Self-pay | Admitting: Neurology

## 2018-10-16 ENCOUNTER — Other Ambulatory Visit: Payer: Self-pay

## 2018-10-16 DIAGNOSIS — G40009 Localization-related (focal) (partial) idiopathic epilepsy and epileptic syndromes with seizures of localized onset, not intractable, without status epilepticus: Secondary | ICD-10-CM

## 2018-10-16 MED ORDER — LEVETIRACETAM 500 MG PO TABS: 500.0000 mg | ORAL_TABLET | Freq: Two times a day (BID) | ORAL | 3 refills | Status: DC

## 2018-10-16 NOTE — Progress Notes (Signed)
Virtual Visit via Telephone Note The purpose of this virtual visit is to provide medical care while limiting exposure to the novel coronavirus.    Consent was obtained for phone visit:  Yes.   Answered questions that patient had about telehealth interaction:  Yes.   I discussed the limitations, risks, security and privacy concerns of performing an evaluation and management service by telephone. I also discussed with the patient that there may be a patient responsible charge related to this service. The patient expressed understanding and agreed to proceed.  Pt location: Home Physician Location: office Name of referring provider:  Aletha Halim., PA-C I connected with .[name] at patients initiation/request on 10/16/2018 at 10:00 AM EDT by telephone and verified that I am speaking with the correct person using two identifiers.  Pt MRN:  [id number] Pt DOB:  September 18, 1940   History of Present Illness:  The patient had a telephone visit on 10/16/2018. She was last seen in the neurology clinic 7 months ago for recurrent seizures. She reports doing well, she has been seizure-free since January 2018 on Levetiracetam 500mg  BID. She denies any staring/unresponsive episodes, gaps in time, olfactory/gustatory hallucinations, focal numbness/tingling/weakness, myoclonic jerks. She reports she is doing really well, she denies any headaches, dizziness, vision changes, no falls. No side effects on LEV. Mood is good. Sleep is good, her husband tells her she snores a lot and nudges her at times.  History on Initial Assessment 05/17/2016: This is a pleasant 78 yo RH woman with a history of hypertension, hyperlipidemia, diabetes, CAD s/p MI, mitral valve repair and closure of patent foramen ovale in 2010, stroke with no residual deficits, with seizures. The first episode occurred in 01/04/2015 while in church, she was sitting and unresponsive with eyes rolled up for a few minutes. She vomited after. She  did not seek medical attention at that time. On 08/13/15, she woke up her husband making a loud noise, her tongue was stuck out to one side, she was snorting and limp like a ragdoll for 5-6 minutes. No incontinence. She woke up with EMS in her house. She was brought to [facility] ER where CBC and CMP were unremarkable except for elevated blood glucose. Lactic acid was elevated 2.66. Head CT did not show any acute changes, there was an old right cerebellar infarct seen. She had a carotid ultrasound done which was reported as unremarkable. The third episode occurred on 02/05/16, she was again asleep and awakened her husband with jerking movements, she was rigid with her arms flexed forward lasting a few minutes, no tongue bite or incontinence. She was confused afterwards. Vital signs were normal. She had an EEG which showed occasional focal slowing over the left temporal region. I personally reviewed MRI brain with and without contrast which did not show any acute changes. There was a remote inferior right cerebellar infarct, mild atrophy and chronic microvascular disease, hippocampi symmetric without abnormal signal or enhancement. She was discharged home on Keppra 500mg  BID with no further similar episodes since January. Her husband denies any significant changes except she is more talkative than she used to be, which he likes. She can be more combative when they have discussions, which is definitely a small personality change for her. She feels her memory is pretty good, she feels she has more energy. She stopped driving 8 years ago. She denies any missed medications, she cooks without difficulties, her husband is in charge of bills. No difficulties with ADLs. She had a  normal birth and early development.  There is no history of febrile convulsions, CNS infections such as meningitis/encephalitis, significant traumatic brain injury, neurosurgical procedures, or family history of seizures.   MEDICATIONS: Current Outpatient  Medications on File Prior to Visit  Medication Sig Dispense Refill  . amLODipine (NORVASC) 5 MG tablet Take 1.5 tablets (7.5 mg total) by mouth daily. 135 tablet 3  . aspirin EC 81 MG tablet Take 1 tablet (81 mg total) by mouth daily.    Marland Kitchen glimepiride (AMARYL) 2 MG tablet Take 2 mg by mouth daily.     Marland Kitchen glucose blood (ONE TOUCH ULTRA TEST) test strip TEST ONCE DAILY    . IRON PO Take 27 mg by mouth daily.     Marland Kitchen levETIRAcetam (KEPPRA) 500 MG tablet Take 1 tablet (500 mg total) by mouth 2 (two) times daily. 60 tablet 11  . metoprolol tartrate (LOPRESSOR) 50 MG tablet TAKE ONE TABLET BY MOUTH TWICE DAILY 180 tablet 3  . Omega-3 Fatty Acids (FISH OIL) 1000 MG CAPS Take by mouth daily.    . Potassium 99 MG TABS Take 1 tablet by mouth daily.    . ramipril (ALTACE) 10 MG capsule Take 1 capsule (10 mg total) by mouth 2 (two) times daily. 180 capsule 3  . rosuvastatin (CRESTOR) 20 MG tablet Take 1 tablet (20 mg total) by mouth daily. 90 tablet 3   No current facility-administered medications on file prior to visit.      Observations/Objective:  Limited due to nature of phone visit. Patient is awake, alert, able to answer questions without confusion or dysarthria.   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 She had an episode of unresponsiveness in December 2016, then a nocturnal seizure in July 2017 and January 2018. MRI brain no acute changes, there is an old right cerebellar stroke. EEG had shown occasional left temporal slowing. Seizures suggestive of focal to bilateral tonic-clonic epilepsy, etiology unclear. She continues to do well seizure-free since January 2018 on Levetiracetam 500mg  BID, no side effects. Refills sent. She does not drive and is aware of  [place] driving laws to stop driving after a seizure until 6 months seizure-free. Follow-up in 1 year, they know to call for any changes.    Follow Up Instructions:   -I discussed the assessment and treatment plan with the patient. The patient was provided an opportunity to ask questions and all were answered. The patient agreed with the plan and demonstrated an understanding of the instructions.   The patient was advised to call back or seek an in-person evaluation if the symptoms worsen or if the condition fails to improve as anticipated.    Total Time spent in visit with the patient was:  5 minutes, of which 100% of the time was spent in counseling and/or coordinating care on the above.   Pt understands and agrees with the plan of care outlined.     , MD

## 2019-04-05 ENCOUNTER — Ambulatory Visit: Payer: Medicare PPO | Attending: Internal Medicine

## 2019-04-05 ENCOUNTER — Other Ambulatory Visit: Payer: Self-pay

## 2019-04-05 DIAGNOSIS — Z23 Encounter for immunization: Secondary | ICD-10-CM

## 2019-04-05 NOTE — Progress Notes (Signed)
   Covid-19 Vaccination Clinic  Name:  [name]    MRN: [id number] DOB: 1940-11-09  04/05/2019  [name] was observed post Covid-19 immunization for 15 minutes without incident. She was provided with Vaccine Information Sheet and instruction to access the V-Safe system.   [name] was instructed to call 911 with any severe reactions post vaccine: Marland Kitchen Difficulty breathing  . Swelling of face and throat  . A fast heartbeat  . A bad rash all over body  . Dizziness and weakness   Immunizations Administered    Name Date Dose VIS Date Route   Pfizer COVID-19 Vaccine 04/05/2019 12:49 PM 0.3 mL 12/28/2018 Intramuscular   Manufacturer: [employer], [employer]   Lot: [id number]   NDC: [address]-7

## 2019-05-01 ENCOUNTER — Ambulatory Visit: Payer: Medicare PPO | Attending: Internal Medicine

## 2019-05-01 DIAGNOSIS — Z23 Encounter for immunization: Secondary | ICD-10-CM

## 2019-05-01 NOTE — Progress Notes (Signed)
   Covid-19 Vaccination Clinic  Name:  [name]    MRN: [id number] DOB: Jan 19, 1940  05/01/2019  [name] was observed post Covid-19 immunization for 15 minutes without incident. She was provided with Vaccine Information Sheet and instruction to access the V-Safe system.   [name] was instructed to call 911 with any severe reactions post vaccine: Marland Kitchen Difficulty breathing  . Swelling of face and throat  . A fast heartbeat  . A bad rash all over body  . Dizziness and weakness   Immunizations Administered    Name Date Dose VIS Date Route   Pfizer COVID-19 Vaccine 05/01/2019 12:33 PM 0.3 mL 12/28/2018 Intramuscular   Manufacturer: [employer], [employer]   Lot: [id number]   NDC: [address]-2

## 2019-06-04 NOTE — Progress Notes (Signed)
[name] Date of Birth: 09-10-1940 Medical Record #[id number]  History of Present Illness: [name] is seen for  followup CAD. She has a history of coronary disease and is status post coronary bypass surgery in 2010. Prior to her bypass surgery her ejection fraction was 35%. Repeat echocardiogram in September of 2011 showed a normal ejection fraction of 65-70%.   On follow up today she reports she is doing well.   She has had no further seizure episodes since 2017.  No chest pain or dyspnea. She walks a lot. Tries to eat healthy but has gained about 5 lbs. BP has been up. No edema or palpitations.   Current Outpatient Medications on File Prior to Visit  Medication Sig Dispense Refill  . amLODipine (NORVASC) 5 MG tablet Take 1.5 tablets (7.5 mg total) by mouth daily. 135 tablet 3  . aspirin EC 81 MG tablet Take 1 tablet (81 mg total) by mouth daily.    Marland Kitchen glimepiride (AMARYL) 2 MG tablet Take 2 mg by mouth daily.     Marland Kitchen glucose blood (ONE TOUCH ULTRA TEST) test strip TEST ONCE DAILY    . IRON PO Take 27 mg by mouth daily.     Marland Kitchen levETIRAcetam (KEPPRA) 500 MG tablet Take 1 tablet (500 mg total) by mouth 2 (two) times daily. 180 tablet 3  . metoprolol tartrate (LOPRESSOR) 50 MG tablet TAKE ONE TABLET BY MOUTH TWICE DAILY 180 tablet 3  . Omega-3 Fatty Acids (FISH OIL) 1000 MG CAPS Take by mouth daily.    . Potassium 99 MG TABS Take 1 tablet by mouth daily.    . ramipril (ALTACE) 10 MG capsule Take 1 capsule (10 mg total) by mouth 2 (two) times daily. 180 capsule 3  . rosuvastatin (CRESTOR) 20 MG tablet Take 1 tablet (20 mg total) by mouth daily. 90 tablet 3   No current facility-administered medications on file prior to visit.    No Known Allergies  Past Medical History:  Diagnosis Date  . Coronary artery disease    a. 09/2007 s/p NSTEMI;  b. 04/2008 s/p CABG x 4 (LIMA->LAD, VG->D1, VG->RI, VG->dRCA).  . Diabetes mellitus   . Hyperlipidemia   . Hypertension   . Ischemic  cardiomyopathy    a. 2010 EF 35-40%;  b. 09/2009 EF 65-70%.  . Mitral regurgitation    a.04/2008 s/p MVR @ time of CABG w/ a 66mm Edwards Physio II annuloplasty ring (model # [id number], ser # [id number]).  . Non-ST elevation MI (NSTEMI) ([facility]) 09/2007   hx of  . PFO (patent foramen ovale)    a. 04/2008 s/p PFO closure @ time of CABG.  . Seizure ([facility])    a. 07/2014 ED visit for seizure vs syncope.  . Sepsis following intra-abdominal surgery ([facility])   . Status post CVA     Past Surgical History:  Procedure Laterality Date  . CARDIAC CATHETERIZATION  04/08/2008  . CORONARY ARTERY BYPASS GRAFT  2010   lima graft to lad,saphenous vein graft tothe diagonal,saphenous vein graft to the intermediate branch  and saphenous vein graft to the distal right coronary  . MITRAL VALVE REPAIR    . status post partial colectomy      Social History   Tobacco Use  Smoking Status Never Smoker  Smokeless Tobacco Never Used    Social History   Substance and Sexual Activity  Alcohol Use No    Family History  Problem Relation Age of Onset  . Hypertension Mother   .  Melanoma Mother   . Heart disease Father        PPM    Review of Systems: As noted in history of present illness.  All other systems were reviewed and are negative.  Physical Exam: BP (!) 150/72   Pulse 71   Ht 5\' 1"  (1.549 m)   Wt 175 lb (79.4 kg)   SpO2 97%   BMI 33.07 kg/m  GENERAL:  Well appearing WF in NAD HEENT:  PERRL, EOMI, sclera are clear. Oropharynx is clear. NECK:  No jugular venous distention, carotid upstroke brisk and symmetric, no bruits, no thyromegaly or adenopathy LUNGS:  Clear to auscultation bilaterally CHEST:  Unremarkable HEART:  RRR,  PMI not displaced or sustained,S1 and S2 within normal limits, no S3, no S4: no clicks, no rubs, no murmurs ABD:  Soft, nontender. BS +, no masses or bruits. No hepatomegaly, no splenomegaly EXT:  2 + pulses throughout, no edema, no cyanosis no clubbing SKIN:  Warm and dry.  No  rashes NEURO:  Alert and oriented x 3. Cranial nerves II through XII intact. PSYCH:  Cognitively intact    LABORATORY DATA: Labs dated 02/18/16: normal BMET. Dated 02/20/17: A1c 6.8%. Glucose 200. Otherwise CMET normal. Cholesterol 137, triglycerides 233, HDL 33, LDL 69.  Dated 03/26/19: A1c 7.2%. triglycerides 191, cholesterol 130, HDL 32, LDL 60. Glucose 188, CMET normal. CBC normal.    Ecg today shows NSR with LAD. Old septal infarct.  I have personally reviewed and interpreted this study.   Echo: 09/01/15:Study Conclusions  - Left ventricle: The cavity size was normal. Wall thickness was   normal. Systolic function was normal. The estimated ejection   fraction was in the range of 60% to 65%. Wall motion was normal;   there were no regional wall motion abnormalities. Left   ventricular diastolic function parameters were normal. - Mitral valve: Prior procedures included surgical repair. The   findings are consistent with mild stenosis. There was mild   regurgitation. - Pulmonary arteries: Systolic pressure was mildly increased. PA   peak pressure: 32 mm Hg (S).  Assessment / Plan: 1. Coronary disease status post CABG in 2010. She remains asymptomatic. Ejection fraction was normal in 2017. We will continue on aspirin, metoprolol, and statin therapy.   2. Seizure disorder. Now on Keppra without recurrence. Followed by Neurology.  3. Hypertension. Blood pressure is not optimal.  I recommend continuing current  ramipril, amlodipine, and lopressor. Will add chlorthalidone 12.5 mg daily. Follow up in HTN clinic in 2-3 weeks with BMET.  4. Hypercholesterolemia on chronic statin therapy. LDL is at goal < 70. Triglycerides are a little high. Focus on restriction of sweets and starches.  5. DM type 2. Per primary care.   I will follow up in 6 months.

## 2019-06-06 ENCOUNTER — Ambulatory Visit: Payer: Medicare PPO | Admitting: Cardiology

## 2019-06-06 ENCOUNTER — Other Ambulatory Visit: Payer: Self-pay

## 2019-06-06 ENCOUNTER — Encounter: Payer: Self-pay | Admitting: Cardiology

## 2019-06-06 VITALS — BP 150/72 | HR 71 | Ht 61.0 in | Wt 175.0 lb

## 2019-06-06 DIAGNOSIS — I25810 Atherosclerosis of coronary artery bypass graft(s) without angina pectoris: Secondary | ICD-10-CM

## 2019-06-06 DIAGNOSIS — I10 Essential (primary) hypertension: Secondary | ICD-10-CM | POA: Diagnosis not present

## 2019-06-06 DIAGNOSIS — E7800 Pure hypercholesterolemia, unspecified: Secondary | ICD-10-CM

## 2019-06-06 DIAGNOSIS — E119 Type 2 diabetes mellitus without complications: Secondary | ICD-10-CM | POA: Diagnosis not present

## 2019-06-06 DIAGNOSIS — Z951 Presence of aortocoronary bypass graft: Secondary | ICD-10-CM | POA: Diagnosis not present

## 2019-06-06 MED ORDER — METOPROLOL TARTRATE 50 MG PO TABS: ORAL_TABLET | ORAL | 3 refills | Status: DC

## 2019-06-06 MED ORDER — CHLORTHALIDONE 25 MG PO TABS: 12.5000 mg | ORAL_TABLET | Freq: Every day | ORAL | 3 refills | Status: DC

## 2019-06-06 MED ORDER — RAMIPRIL 10 MG PO CAPS: 10.0000 mg | ORAL_CAPSULE | Freq: Two times a day (BID) | ORAL | 3 refills | Status: DC

## 2019-06-06 MED ORDER — ROSUVASTATIN CALCIUM 20 MG PO TABS: 20.0000 mg | ORAL_TABLET | Freq: Every day | ORAL | 3 refills | Status: DC

## 2019-06-06 MED ORDER — AMLODIPINE BESYLATE 5 MG PO TABS: 7.5000 mg | ORAL_TABLET | Freq: Every day | ORAL | 3 refills | Status: DC

## 2019-06-06 NOTE — Patient Instructions (Signed)
Add chlorthalidone 12.5 mg daily  Return in 2-3 weeks in our HTN clinic

## 2019-07-03 ENCOUNTER — Telehealth: Payer: Self-pay | Admitting: Cardiology

## 2019-07-03 NOTE — Telephone Encounter (Signed)
New Message   Pt c/o medication issue:  1. Name of Medication:chlorthalidone (HYGROTON) 25 MG tablet    2. How are you currently taking this medication (dosage and times per day)? Take 0.5 tablets (12.5 mg total) by mouth daily.  3. Are you having a reaction (difficulty breathing--STAT)?   4. What is your medication issue? Patient is requesting a callback because she stopped taking medication because she was having real bad back pains

## 2019-07-03 NOTE — Telephone Encounter (Signed)
Called patient, spoke to her and husband.  He states about 3 weeks go she seen Dr.Jordan and he started her on Clorathladione 12.5 mg- they wanted to do this and come back in 2-3 weeks for blood work then increase. He states that Tuesday (yesterday) and today, she stopped taking it due to having some severe back pain. When asked if when she stopped it if the pain got better- he said no its only been two days.   They were not aware they could come in for blood work as a walk in- so did explain they could come, not fasting for this blood work.  They will come tomorrow to have it done.   Patient and husband would like to have Dr.Jordan know about this medication issue and get his opinion on what to do, they also would like to hear from [place] if possible.

## 2019-07-04 LAB — BASIC METABOLIC PANEL
BUN/Creatinine Ratio: 27 (ref 12–28)
BUN: 21 mg/dL (ref 8–27)
CO2: 25 mmol/L (ref 20–29)
Calcium: 10.2 mg/dL (ref 8.7–10.3)
Chloride: 100 mmol/L (ref 96–106)
Creatinine, Ser: 0.79 mg/dL (ref 0.57–1.00)
GFR calc Af Amer: 84 mL/min/{1.73_m2} (ref >59)
GFR calc non Af Amer: 72 mL/min/{1.73_m2} (ref >59)
Glucose: 153 mg/dL — ABNORMAL HIGH (ref 65–99)
Potassium: 4.1 mmol/L (ref 3.5–5.2)
Sodium: 140 mmol/L (ref 134–144)

## 2019-07-04 NOTE — Telephone Encounter (Signed)
Patient's husband is requesting to speak with Elnita Maxwell. He states he has additional questions.

## 2019-07-04 NOTE — Telephone Encounter (Signed)
Called patient no answer.LMTC. 

## 2019-07-04 NOTE — Telephone Encounter (Signed)
Returned call to patient's husband.He wanted to know if he needed to check patient's B/P when she first gets up in morning and 2 hours after medications.Advised that will be ok.Advised to keep a diary and call readings to office in 1 week.

## 2019-07-04 NOTE — Telephone Encounter (Signed)
Not really sure if the medication is causing her back pain. May be unrelated. She is already on maximum doses of amlodipine, Altace and metoprolol. If back pain is better she could rechallenge with the chlorthalidone. Otherwise will need to try something else. Next choice would be hydralazine 25 mg tid.   Alante Weimann [place] MD, [facility]

## 2019-07-04 NOTE — Telephone Encounter (Signed)
Spoke to patient Dr.Jordan's advice given.She stated since she stopped taking Chlorthalidone her back pain is improving.Stated she wanted to wait before she starts Hydralazine.She will check her B/P daily for 1 week and call back to report readings.If B/P is elevated she will start taking Hydralazine.

## 2019-07-11 NOTE — Telephone Encounter (Signed)
Spoke to patient's husband.He stated patient's B/P has been good,ranging-123/67,129,69,123/69,126/66,128/73,126/70,126/73.He did not check pulse.Stated he she wanted to cancel appointment with pharmacy next week.He will continue to monitor B/P and call back if elevated.I will make Dr.Jordan aware.

## 2019-07-11 NOTE — Telephone Encounter (Signed)
Mr Veith called wanting to speak with you.

## 2019-07-16 ENCOUNTER — Ambulatory Visit: Payer: Medicare PPO

## 2019-08-02 ENCOUNTER — Ambulatory Visit: Payer: Medicare PPO | Admitting: Neurology

## 2019-08-02 ENCOUNTER — Other Ambulatory Visit: Payer: Self-pay

## 2019-08-02 ENCOUNTER — Encounter: Payer: Self-pay | Admitting: Neurology

## 2019-08-02 DIAGNOSIS — G40009 Localization-related (focal) (partial) idiopathic epilepsy and epileptic syndromes with seizures of localized onset, not intractable, without status epilepticus: Secondary | ICD-10-CM | POA: Diagnosis not present

## 2019-08-02 MED ORDER — LEVETIRACETAM 500 MG PO TABS: 500.0000 mg | ORAL_TABLET | Freq: Two times a day (BID) | ORAL | 3 refills | Status: DC

## 2019-08-02 NOTE — Progress Notes (Signed)
NEUROLOGY FOLLOW UP OFFICE NOTE  [name] [id number] [birth year]  HISTORY OF PRESENT ILLNESS: I had the pleasure of seeing [name] in follow-up in the neurology clinic on 08/02/2019.  The patient was last seen almost a year ago for well-controlled seizures. She is on Levetiracetam 500mg  BID without seizures since January 2018. No side effects. She and her husband deny any staring/unresponsive episodes, gaps in time, olfactory/gustatory hallucinations, focal numbness/tingling/weakness, myoclonic jerks. No significant headaches, dizziness, vision changes, no falls. Sleep overall okay, she has had an URI with cough affecting her sleep the past 1.5 weeks, but this is getting better. Mood is okay.    History on Initial Assessment 05/17/2016: This is a pleasant 79 yo RH woman with a history of hypertension, hyperlipidemia, diabetes, CAD s/p MI, mitral valve repair and closure of patent foramen ovale in 2010, stroke with no residual deficits, with seizures. The first episode occurred in 01/04/2015 while in church, she was sitting and unresponsive with eyes rolled up for a few minutes. She vomited after. She did not seek medical attention at that time. On 08/13/15, she woke up her husband making a loud noise, her tongue was stuck out to one side, she was snorting and limp like a ragdoll for 5-6 minutes. No incontinence. She woke up with EMS in her house. She was brought to [facility] ER where CBC and CMP were unremarkable except for elevated blood glucose. Lactic acid was elevated 2.66. Head CT did not show any acute changes, there was an old right cerebellar infarct seen. She had a carotid ultrasound done which was reported as unremarkable. The third episode occurred on 02/05/16, she was again asleep and awakened her husband with jerking movements, she was rigid with her arms flexed forward lasting a few minutes, no tongue bite or incontinence. She was confused afterwards. Vital signs were normal. She had  an EEG which showed occasional focal slowing over the left temporal region. I personally reviewed MRI brain with and without contrast which did not show any acute changes. There was a remote inferior right cerebellar infarct, mild atrophy and chronic microvascular disease, hippocampi symmetric without abnormal signal or enhancement. She was discharged home on Keppra 500mg  BID with no further similar episodes since January. Her husband denies any significant changes except she is more talkative than she used to be, which he likes. She can be more combative when they have discussions, which is definitely a small personality change for her. She feels her memory is pretty good, she feels she has more energy. She stopped driving 8 years ago. She denies any missed medications, she cooks without difficulties, her husband is in charge of bills. No difficulties with ADLs. She had a normal birth and early development.  There is no history of febrile convulsions, CNS infections such as meningitis/encephalitis, significant traumatic brain injury, neurosurgical procedures, or family history of seizures.    PAST MEDICAL HISTORY: Past Medical History:  Diagnosis Date  . Coronary artery disease    a. 09/2007 s/p NSTEMI;  b. 04/2008 s/p CABG x 4 (LIMA->LAD, VG->D1, VG->RI, VG->dRCA).  . Diabetes mellitus   . Hyperlipidemia   . Hypertension   . Ischemic cardiomyopathy    a. 2010 EF 35-40%;  b. 09/2009 EF 65-70%.  . Mitral regurgitation    a.04/2008 s/p MVR @ time of CABG w/ a 47mm Edwards Physio II annuloplasty ring (model # 10/2009, ser # [id number]).  . Non-ST elevation MI (NSTEMI) ([facility]) 09/2007   hx of  .  PFO (patent foramen ovale)    a. 04/2008 s/p PFO closure @ time of CABG.  . Seizure ([facility])    a. 07/2014 ED visit for seizure vs syncope.  . Sepsis following intra-abdominal surgery ([facility])   . Status post CVA     MEDICATIONS: Current Outpatient Medications on File Prior to Visit  Medication Sig Dispense Refill  .  amLODipine (NORVASC) 5 MG tablet Take 1.5 tablets (7.5 mg total) by mouth daily. (Patient taking differently: Take 5 mg by mouth daily. ) 135 tablet 3  . aspirin EC 81 MG tablet Take 1 tablet (81 mg total) by mouth daily.    Marland Kitchen glimepiride (AMARYL) 2 MG tablet Take 2 mg by mouth daily.     Marland Kitchen glucose blood (ONE TOUCH ULTRA TEST) test strip TEST ONCE DAILY    . IRON PO Take 27 mg by mouth daily.     Marland Kitchen levETIRAcetam (KEPPRA) 500 MG tablet Take 1 tablet (500 mg total) by mouth 2 (two) times daily. 180 tablet 3  . metoprolol tartrate (LOPRESSOR) 50 MG tablet TAKE ONE TABLET BY MOUTH TWICE DAILY 180 tablet 3  . Omega-3 Fatty Acids (FISH OIL) 1000 MG CAPS Take by mouth daily.    . Potassium 99 MG TABS Take 1 tablet by mouth daily.    . ramipril (ALTACE) 10 MG capsule Take 1 capsule (10 mg total) by mouth 2 (two) times daily. 180 capsule 3  . rosuvastatin (CRESTOR) 20 MG tablet Take 1 tablet (20 mg total) by mouth daily. 90 tablet 3   No current facility-administered medications on file prior to visit.    ALLERGIES: No Known Allergies  FAMILY HISTORY: Family History  Problem Relation Age of Onset  . Hypertension Mother   . Melanoma Mother   . Heart disease Father        PPM    SOCIAL HISTORY: Social History   Socioeconomic History  . Marital status: Married    Spouse name: Not on file  . Number of children: 3  . Years of education: Not on file  . Highest education level: Not on file  Occupational History  . Not on file  Tobacco Use  . Smoking status: Never Smoker  . Smokeless tobacco: Never Used  Vaping Use  . Vaping Use: Never used  Substance and Sexual Activity  . Alcohol use: No  . Drug use: No  . Sexual activity: Not Currently    Partners: Male  Other Topics Concern  . Not on file  Social History Narrative   Right handed      Highest Level of edu- 11th grade      Lives with husband   Social Determinants of Health   Financial Resource Strain:   . Difficulty of  Paying Living Expenses:   Food Insecurity:   . Worried About Programme researcher, broadcasting/film/video in the Last Year:   . Barista in the Last Year:   Transportation Needs:   . Freight forwarder (Medical):   Marland Kitchen Lack of Transportation (Non-Medical):   Physical Activity:   . Days of Exercise per Week:   . Minutes of Exercise per Session:   Stress:   . Feeling of Stress :   Social Connections:   . Frequency of Communication with Friends and Family:   . Frequency of Social Gatherings with Friends and Family:   . Attends Religious Services:   . Active Member of Clubs or Organizations:   . Attends Banker  Meetings:   Marland Kitchen Marital Status:   Intimate Partner Violence:   . Fear of Current or Ex-Partner:   . Emotionally Abused:   Marland Kitchen Physically Abused:   . Sexually Abused:      PHYSICAL EXAM: Vitals:   08/02/19 1403  BP: (!) 163/84  Pulse: 68  SpO2: 96%   General: No acute distress Head:  Normocephalic/atraumatic Neck: supple, no paraspinal tenderness, full range of motion Heart:  Regular rate and rhythm Lungs:  Clear to auscultation bilaterally Back: No paraspinal tenderness Skin/Extremities: No rash, no edema Neurological Exam: alert and oriented to person, place, and time. No aphasia or dysarthria. Fund of knowledge is appropriate.  Recent and remote memory are intact.  Attention and concentration are normal.  Cranial nerves: Pupils equal, round. Extraocular movements intact with no nystagmus. Visual fields full. No facial asymmetry. Motor: Bulk and tone normal, muscle strength 5/5 throughout with no pronator drift. Finger to nose testing intact.  Gait narrow-based and steady, no ataxia  IMPRESSION: This is a pleasant 79 yo RH woman with a history of hypertension, hyperlipidemia, diabetes, CAD s/p MI, mitral valve repair and closure of patent foramen ovale in 2010, right cerebellar stroke with no residual deficits, with recurrent seizures since December 2016. She had an episode  of unresponsiveness in December 2016, then a nocturnal seizure in July 2017 and January 2018. MRI brain no acute changes, there is an old right cerebellar stroke. EEG had shown occasional left temporal slowing. Seizures suggestive of focal to bilateral tonic-clonic epilepsy, etiology unclear. She has been seizure-free since January 2018, refills for Levetiracetam 500mg  BID sent today. She does not drive. Follow-up in 1 year, she knows to call for any changes.    Thank you for allowing me to participate in her care.  Please do not hesitate to call for any questions or concerns.   , M.D.   CC: Patrcia Dolly, PA-C

## 2019-08-02 NOTE — Patient Instructions (Signed)
Always good to see you! Continue Keppra 500mg  twice a day, refills have been sent to [facility]. I hope you continue to feel better soon! Follow-up in 1 year, call for any changes.   Seizure Precautions: 1. If medication has been prescribed for you to prevent seizures, take it exactly as directed.  Do not stop taking the medicine without talking to your doctor first, even if you have not had a seizure in a long time.   2. Avoid activities in which a seizure would cause danger to yourself or to others.  Don't operate dangerous machinery, swim alone, or climb in high or dangerous places, such as on ladders, roofs, or girders.  Do not drive unless your doctor says you may.  3. If you have any warning that you may have a seizure, lay down in a safe place where you can't hurt yourself.    4.  No driving for 6 months from last seizure, as per [facility].   Please refer to the following link on the [employer]ile[employer]'s website for more information: http://www.epilepsyfoundation.org/answerplace/Social/driving/drivingu.cfm   5.  Maintain good sleep hygiene. Avoid alcohol.  7.  Contact your doctor if you have any problems that may be related to the medicine you are taking.  8.  Call 911 and bring the patient back to the ED if:        A.  The seizure lasts longer than 5 minutes.       B.  The patient doesn't awaken shortly after the seizure  C.  The patient has new problems such as difficulty seeing, speaking or moving  D.  The patient was injured during the seizure  E.  The patient has a temperature over 102 F (39C)  F.  The patient vomited and now is having trouble breathing

## 2019-08-14 ENCOUNTER — Telehealth: Payer: Self-pay | Admitting: Cardiology

## 2019-08-14 NOTE — Telephone Encounter (Signed)
LVM for patient to return call to get scheduled with Jordan from recall list 

## 2019-10-16 ENCOUNTER — Ambulatory Visit: Payer: Medicare Other | Admitting: Neurology

## 2019-11-26 NOTE — Progress Notes (Signed)
[name] Date of Birth: Oct 18, 1940 Medical Record #[id number]  History of Present Illness: [name] is seen for  followup CAD. She has a history of coronary disease and is status post coronary bypass surgery in 2010. Prior to her bypass surgery her ejection fraction was 35%. Repeat echocardiogram in September of 2011 showed a normal ejection fraction of 65-70%. Repeat Echo in 2017 also showed normal function.   On follow up today she reports she is doing well.   She reports her sugars are good. She has no chest pain or dyspnea. She walks a lot. Reports BP at home typically 135 systolic.   Current Outpatient Medications on File Prior to Visit  Medication Sig Dispense Refill   amLODipine (NORVASC) 5 MG tablet Take 1.5 tablets (7.5 mg total) by mouth daily. (Patient taking differently: Take 5 mg by mouth daily. ) 135 tablet 3   aspirin EC 81 MG tablet Take 1 tablet (81 mg total) by mouth daily.     glimepiride (AMARYL) 2 MG tablet Take 2 mg by mouth daily.      glucose blood (ONE TOUCH ULTRA TEST) test strip TEST ONCE DAILY     IRON PO Take 27 mg by mouth daily.      levETIRAcetam (KEPPRA) 500 MG tablet Take 1 tablet (500 mg total) by mouth 2 (two) times daily. 180 tablet 3   metoprolol tartrate (LOPRESSOR) 50 MG tablet TAKE ONE TABLET BY MOUTH TWICE DAILY 180 tablet 3   Omega-3 Fatty Acids (FISH OIL) 1000 MG CAPS Take by mouth daily.     Potassium 99 MG TABS Take 1 tablet by mouth daily.     ramipril (ALTACE) 10 MG capsule Take 1 capsule (10 mg total) by mouth 2 (two) times daily. 180 capsule 3   rosuvastatin (CRESTOR) 20 MG tablet Take 1 tablet (20 mg total) by mouth daily. 90 tablet 3   No current facility-administered medications on file prior to visit.    No Known Allergies  Past Medical History:  Diagnosis Date   Coronary artery disease    a. 09/2007 s/p NSTEMI;  b. 04/2008 s/p CABG x 4 (LIMA->LAD, VG->D1, VG->RI, VG->dRCA).   Diabetes mellitus     Hyperlipidemia    Hypertension    Ischemic cardiomyopathy    a. 2010 EF 35-40%;  b. 09/2009 EF 65-70%.   Mitral regurgitation    a.04/2008 s/p MVR @ time of CABG w/ a 57mm Edwards Physio II annuloplasty ring (model # [id number], ser # [id number]).   Non-ST elevation MI (NSTEMI) (HCC) 09/2007   hx of   PFO (patent foramen ovale)    a. 04/2008 s/p PFO closure @ time of CABG.   Seizure (HCC)    a. 07/2014 ED visit for seizure vs syncope.   Sepsis following intra-abdominal surgery (HCC)    Status post CVA     Past Surgical History:  Procedure Laterality Date   CARDIAC CATHETERIZATION  04/08/2008   CORONARY ARTERY BYPASS GRAFT  2010   lima graft to lad,saphenous vein graft tothe diagonal,saphenous vein graft to the intermediate branch  and saphenous vein graft to the distal right coronary   MITRAL VALVE REPAIR     status post partial colectomy      Social History   Tobacco Use  Smoking Status Never Smoker  Smokeless Tobacco Never Used    Social History   Substance and Sexual Activity  Alcohol Use No    Family History  Problem Relation Age of Onset  Hypertension Mother    Melanoma Mother    Heart disease Father        PPM    Review of Systems: As noted in history of present illness.  All other systems were reviewed and are negative.  Physical Exam: BP (!) 150/76 (BP Location: Left Arm, Patient Position: Sitting, Cuff Size: Large)    Pulse 71    Ht 5\' 1"  (1.549 m)    Wt 172 lb (78 kg)    BMI 32.50 kg/m  GENERAL:  Well appearing WF in NAD HEENT:  PERRL, EOMI, sclera are clear. Oropharynx is clear. NECK:  No jugular venous distention, carotid upstroke brisk and symmetric, no bruits, no thyromegaly or adenopathy LUNGS:  Clear to auscultation bilaterally CHEST:  Unremarkable HEART:  RRR,  PMI not displaced or sustained,S1 and S2 within normal limits, no S3, no S4: no clicks, no rubs, no murmurs ABD:  Soft, nontender. BS +, no masses or bruits. No hepatomegaly, no  splenomegaly EXT:  2 + pulses throughout, no edema, no cyanosis no clubbing SKIN:  Warm and dry.  No rashes NEURO:  Alert and oriented x 3. Cranial nerves II through XII intact. PSYCH:  Cognitively intact    LABORATORY DATA: Labs dated 02/18/16: normal BMET. Dated 02/20/17: A1c 6.8%. Glucose 200. Otherwise CMET normal. Cholesterol 137, triglycerides 233, HDL 33, LDL 69.  Dated 03/26/19: A1c 7.2%. triglycerides 191, cholesterol 130, HDL 32, LDL 60. Glucose 188, CMET normal. CBC normal.    Echo: 09/01/15:Study Conclusions  - Left ventricle: The cavity size was normal. Wall thickness was   normal. Systolic function was normal. The estimated ejection   fraction was in the range of 60% to 65%. Wall motion was normal;   there were no regional wall motion abnormalities. Left   ventricular diastolic function parameters were normal. - Mitral valve: Prior procedures included surgical repair. The   findings are consistent with mild stenosis. There was mild   regurgitation. - Pulmonary arteries: Systolic pressure was mildly increased. PA   peak pressure: 32 mm Hg (S).  Assessment / Plan: 1. Coronary disease status post CABG in 2010. She is asymptomatic. Ejection fraction was normal in 2017. We will continue on aspirin, metoprolol, and statin therapy.   2. Seizure disorder. Now on Keppra without recurrence.   3. Hypertension. Blood pressure is up a little today but normally controlled at home. Continue amlodipine, metoprolol and ramipril  4. Hypercholesterolemia on chronic statin therapy. LDL is at goal < 70.   5. DM type 2. Per primary care.   I will follow up in one year

## 2019-11-28 ENCOUNTER — Encounter: Payer: Self-pay | Admitting: Cardiology

## 2019-11-28 ENCOUNTER — Ambulatory Visit: Payer: Medicare PPO | Admitting: Cardiology

## 2019-11-28 ENCOUNTER — Other Ambulatory Visit: Payer: Self-pay

## 2019-11-28 VITALS — BP 150/76 | HR 71 | Ht 61.0 in | Wt 172.0 lb

## 2019-11-28 DIAGNOSIS — I10 Essential (primary) hypertension: Secondary | ICD-10-CM

## 2019-11-28 DIAGNOSIS — Z951 Presence of aortocoronary bypass graft: Secondary | ICD-10-CM | POA: Diagnosis not present

## 2019-11-28 DIAGNOSIS — I25810 Atherosclerosis of coronary artery bypass graft(s) without angina pectoris: Secondary | ICD-10-CM

## 2019-11-28 DIAGNOSIS — E785 Hyperlipidemia, unspecified: Secondary | ICD-10-CM | POA: Diagnosis not present

## 2019-12-09 ENCOUNTER — Telehealth: Payer: Self-pay | Admitting: Cardiology

## 2019-12-09 NOTE — Telephone Encounter (Signed)
New Message:    Please call, she said she was suppose to have gotten ome papers from you when she was here or her office visit.

## 2019-12-09 NOTE — Telephone Encounter (Signed)
Spoke to patient advised I will mail 11/28/19 AVS.

## 2020-06-08 ENCOUNTER — Other Ambulatory Visit: Payer: Self-pay | Admitting: Cardiology

## 2020-06-10 ENCOUNTER — Telehealth: Payer: Self-pay | Admitting: Cardiology

## 2020-06-10 MED ORDER — ROSUVASTATIN CALCIUM 20 MG PO TABS: 20.0000 mg | ORAL_TABLET | Freq: Every day | ORAL | 0 refills | Status: DC

## 2020-06-10 MED ORDER — AMLODIPINE BESYLATE 5 MG PO TABS: 5.0000 mg | ORAL_TABLET | Freq: Every day | ORAL | 0 refills | Status: DC

## 2020-06-10 MED ORDER — METOPROLOL TARTRATE 50 MG PO TABS: ORAL_TABLET | ORAL | 0 refills | Status: DC

## 2020-06-10 MED ORDER — RAMIPRIL 10 MG PO CAPS: 10.0000 mg | ORAL_CAPSULE | Freq: Two times a day (BID) | ORAL | 0 refills | Status: DC

## 2020-06-10 NOTE — Telephone Encounter (Signed)
Ok to fill meds including amlodipine 5 mg daily  [name] MD, [facility]

## 2020-06-10 NOTE — Telephone Encounter (Signed)
Returned call to patient, advised patient that Amlodipine 5 mg has been sent in. Patient would like message sent to [facility] for when Dr. Illa Level schedule gets open for scheduling in November to get patient an appointment scheduled. Advised patient that I would forward message. Patient verbalized understanding.

## 2020-06-10 NOTE — Telephone Encounter (Signed)
Pt is calling with concerns about her refills.The pharmacy told her she needed to call the doctor  .

## 2020-06-10 NOTE — Telephone Encounter (Signed)
Returned call to patient who states that she needs refills on her Cardiac medications since her follow up is not until November of this year with Dr. [place]. Patient saw Dr. Sw[place]iland last in November of 2021. Patient also states that she has been taking 5 mg of Amlodipine daily due to issues that she was having on the 7.5mg . Patient would like to know if its okay to send in prescription for 5mg  Amlodipine instead.   Advised patient that refills for   Metoprolol (50mg  BID)  Ramipril (10mg  BID) Crestor (20mg  Daily)  has been sent to patient preferred pharmacy.   Advised patient that I would forward message to Dr. for approval of Amlodipine 5mg  Daily refill.   Patient would also like to go ahead and make appointment for November- advised patient that Schedule was not open yet. Patient verbalized understanding.

## 2020-06-12 NOTE — Telephone Encounter (Signed)
Called patient left message on personal voice mail I will call back to schedule follow up appointment with Dr.Jordan when November schedule is open.

## 2020-06-18 MED ORDER — RAMIPRIL 10 MG PO CAPS: 10.0000 mg | ORAL_CAPSULE | Freq: Two times a day (BID) | ORAL | 3 refills | Status: DC

## 2020-06-18 MED ORDER — ROSUVASTATIN CALCIUM 20 MG PO TABS: 20.0000 mg | ORAL_TABLET | Freq: Every day | ORAL | 3 refills | Status: DC

## 2020-06-18 MED ORDER — METOPROLOL TARTRATE 50 MG PO TABS: ORAL_TABLET | ORAL | 3 refills | Status: DC

## 2020-06-18 MED ORDER — AMLODIPINE BESYLATE 5 MG PO TABS: 5.0000 mg | ORAL_TABLET | Freq: Every day | ORAL | 3 refills | Status: DC

## 2020-06-18 NOTE — Addendum Note (Signed)
Addended by: Neoma Laming on: 06/18/2020 10:23 AM   Modules accepted: Orders

## 2020-06-18 NOTE — Telephone Encounter (Signed)
Spoke to patient follow up appointment scheduled with Dr.Jordan 12/08/20 at 11:00 am.Patient requested refills on medications.Refills sent to pharmacy.

## 2020-08-03 ENCOUNTER — Ambulatory Visit: Payer: Medicare PPO | Admitting: Neurology

## 2020-08-03 ENCOUNTER — Encounter: Payer: Self-pay | Admitting: Neurology

## 2020-08-03 ENCOUNTER — Other Ambulatory Visit: Payer: Self-pay

## 2020-08-03 VITALS — BP 164/77 | HR 82 | Ht 60.0 in | Wt 173.6 lb

## 2020-08-03 DIAGNOSIS — G40009 Localization-related (focal) (partial) idiopathic epilepsy and epileptic syndromes with seizures of localized onset, not intractable, without status epilepticus: Secondary | ICD-10-CM | POA: Diagnosis not present

## 2020-08-03 MED ORDER — LEVETIRACETAM 500 MG PO TABS: 500.0000 mg | ORAL_TABLET | Freq: Two times a day (BID) | ORAL | 3 refills | Status: DC

## 2020-08-03 NOTE — Progress Notes (Signed)
NEUROLOGY FOLLOW UP OFFICE NOTE  [name] [id number] 02/07/1940  HISTORY OF PRESENT ILLNESS: I had the pleasure of seeing [name] in follow-up in the neurology clinic on 08/03/2020.  The patient was last seen a year ago for well-controlled seizures. She is alone in the office today. She denies any seizures since January 2018 on Levetiracetam 500mg  BID, no side effects. She denies any staring/unresponsive episodes, gaps in time, olfactory/gustatory hallucinations, focal numbness/tingling/weakness, myoclonic jerks. No headaches, vision changes, no falls. She has occasional brief dizziness when getting out of bed. Mood and sleep are good.    History on Initial Assessment 05/17/2016: This is a pleasant 80 yo RH woman with a history of hypertension, hyperlipidemia, diabetes, CAD s/p MI, mitral valve repair and closure of patent foramen ovale in 2010, stroke with no residual deficits, with seizures. The first episode occurred in 01/04/2015 while in church, she was sitting and unresponsive with eyes rolled up for a few minutes. She vomited after. She did not seek medical attention at that time. On 08/13/15, she woke up her husband making a loud noise, her tongue was stuck out to one side, she was snorting and limp like a ragdoll for 5-6 minutes. No incontinence. She woke up with EMS in her house. She was brought to [facility] ER where CBC and CMP were unremarkable except for elevated blood glucose. Lactic acid was elevated 2.66. Head CT did not show any acute changes, there was an old right cerebellar infarct seen. She had a carotid ultrasound done which was reported as unremarkable. The third episode occurred on 02/05/16, she was again asleep and awakened her husband with jerking movements, she was rigid with her arms flexed forward lasting a few minutes, no tongue bite or incontinence. She was confused afterwards. Vital signs were normal. She had an EEG which showed occasional focal slowing over the  left temporal region. I personally reviewed MRI brain with and without contrast which did not show any acute changes. There was a remote inferior right cerebellar infarct, mild atrophy and chronic microvascular disease, hippocampi symmetric without abnormal signal or enhancement. She was discharged home on Keppra 500mg  BID with no further similar episodes since January. Her husband denies any significant changes except she is more talkative than she used to be, which he likes. She can be more combative when they have discussions, which is definitely a small personality change for her. She feels her memory is pretty good, she feels she has more energy. She stopped driving 8 years ago. She denies any missed medications, she cooks without difficulties, her husband is in charge of bills. No difficulties with ADLs. She had a normal birth and early development.  There is no history of febrile convulsions, CNS infections such as meningitis/encephalitis, significant traumatic brain injury, neurosurgical procedures, or family history of seizures.    PAST MEDICAL HISTORY: Past Medical History:  Diagnosis Date   Coronary artery disease    a. 09/2007 s/p NSTEMI;  b. 04/2008 s/p CABG x 4 (LIMA->LAD, VG->D1, VG->RI, VG->dRCA).   Diabetes mellitus    Hyperlipidemia    Hypertension    Ischemic cardiomyopathy    a. 2010 EF 35-40%;  b. 09/2009 EF 65-70%.   Mitral regurgitation    a.04/2008 s/p MVR @ time of CABG w/ a 71mm Edwards Physio II annuloplasty ring (model # 10/2009, ser # [id number]).   Non-ST elevation MI (NSTEMI) (HCC) 09/2007   hx of   PFO (patent foramen ovale)    a. 04/2008  s/p PFO closure @ time of CABG.   Seizure (HCC)    a. 07/2014 ED visit for seizure vs syncope.   Sepsis following intra-abdominal surgery (HCC)    Status post CVA     MEDICATIONS: Current Outpatient Medications on File Prior to Visit  Medication Sig Dispense Refill   amLODipine (NORVASC) 5 MG tablet Take 1 tablet (5 mg total) by mouth  daily. 90 tablet 3   aspirin EC 81 MG tablet Take 1 tablet (81 mg total) by mouth daily.     glimepiride (AMARYL) 2 MG tablet Take 2 mg by mouth daily.      glucose blood (ONE TOUCH ULTRA TEST) test strip TEST ONCE DAILY     IRON PO Take 27 mg by mouth daily.      levETIRAcetam (KEPPRA) 500 MG tablet Take 1 tablet (500 mg total) by mouth 2 (two) times daily. 180 tablet 3   metoprolol tartrate (LOPRESSOR) 50 MG tablet TAKE ONE TABLET BY MOUTH TWICE DAILY 180 tablet 3   Omega-3 Fatty Acids (FISH OIL) 1000 MG CAPS Take by mouth daily.     Potassium 99 MG TABS Take 1 tablet by mouth daily.     ramipril (ALTACE) 10 MG capsule Take 1 capsule (10 mg total) by mouth 2 (two) times daily. 180 capsule 3   rosuvastatin (CRESTOR) 20 MG tablet Take 1 tablet (20 mg total) by mouth daily. 90 tablet 3   No current facility-administered medications on file prior to visit.    ALLERGIES: No Known Allergies  FAMILY HISTORY: Family History  Problem Relation Age of Onset   Hypertension Mother    Melanoma Mother    Heart disease Father        PPM    SOCIAL HISTORY: Social History   Socioeconomic History   Marital status: Married    Spouse name: Not on file   Number of children: 3   Years of education: Not on file   Highest education level: Not on file  Occupational History   Not on file  Tobacco Use   Smoking status: Never   Smokeless tobacco: Never  Vaping Use   Vaping Use: Never used  Substance and Sexual Activity   Alcohol use: No   Drug use: No   Sexual activity: Not Currently    Partners: Male  Other Topics Concern   Not on file  Social History Narrative   Right handed      Highest Level of edu- 11th grade      Lives with husband   Social Determinants of Health   Financial Resource Strain: Not on file  Food Insecurity: Not on file  Transportation Needs: Not on file  Physical Activity: Not on file  Stress: Not on file  Social Connections: Not on file  Intimate Partner  Violence: Not on file     PHYSICAL EXAM: Vitals:   08/03/20 1442  BP: (!) 164/77  Pulse: 82  SpO2: 95%   General: No acute distress Head:  Normocephalic/atraumatic Skin/Extremities: No rash, no edema Neurological Exam: alert and awake. No aphasia or dysarthria. Fund of knowledge is appropriate.Attention and concentration are normal.   Cranial nerves: Pupils equal, round. Extraocular movements intact with no nystagmus. Visual fields full.  No facial asymmetry.  Motor: Bulk and tone normal, muscle strength 5/5 throughout with no pronator drift.   Finger to nose testing intact.  Gait narrow-based and steady, no ataxia.    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 She had an episode of unresponsiveness in December 2016, then a nocturnal seizure in July 2017 and January 2018. MRI brain no acute changes, there is an old right cerebellar stroke. EEG had shown occasional left temporal slowing. Seizures suggestive of focal to bilateral tonic-clonic epilepsy, etiology unclear. She continues to do well seizure-free since January 2018 on Levetiracetam 500mg  BID, continue current regimen. She does not drive, we discussed [place] driving laws to stop driving after a seizure until 6 months seizure-free. Follow-up in 1 year, call for any changes.    Thank you for allowing me to participate in her care.  Please do not hesitate to call for any questions or concerns.    , M.D.   CC: Patrcia Dolly, PA-C

## 2020-08-03 NOTE — Patient Instructions (Signed)
Always good to see you!  Continue Keppra 500mg  twice a day  2. Follow-up in 1 year, call for any changes   Seizure Precautions: 1. If medication has been prescribed for you to prevent seizures, take it exactly as directed.  Do not stop taking the medicine without talking to your doctor first, even if you have not had a seizure in a long time.   2. Avoid activities in which a seizure would cause danger to yourself or to others.  Don't operate dangerous machinery, swim alone, or climb in high or dangerous places, such as on ladders, roofs, or girders.  Do not drive unless your doctor says you may.  3. If you have any warning that you may have a seizure, lay down in a safe place where you can't hurt yourself.    4.  No driving for 6 months from last seizure, as per [facility].   Please refer to the following link on the Epile[employer] of America's website for more information: http://www.epilepsyfoundation.org/answerplace/Social/driving/drivingu.cfm   5.  Maintain good sleep hygiene. Avoid alcohol.  6.  Contact your doctor if you have any problems that may be related to the medicine you are taking.  7.  Call 911 and bring the patient back to the ED if:        A.  The seizure lasts longer than 5 minutes.       B.  The patient doesn't awaken shortly after the seizure  C.  The patient has new problems such as difficulty seeing, speaking or moving  D.  The patient was injured during the seizure  E.  The patient has a temperature over 102 F (39C)  F.  The patient vomited and now is having trouble breathing

## 2020-09-28 ENCOUNTER — Other Ambulatory Visit: Payer: Self-pay | Admitting: Neurology

## 2020-09-28 DIAGNOSIS — G40009 Localization-related (focal) (partial) idiopathic epilepsy and epileptic syndromes with seizures of localized onset, not intractable, without status epilepticus: Secondary | ICD-10-CM

## 2020-12-06 NOTE — Progress Notes (Signed)
[name] Date of Birth: [birth year] Medical Record #[id number]  History of Present Illness: [name] is seen for  followup CAD. She has a history of coronary disease and is status post coronary bypass surgery in 2010. Prior to her bypass surgery her ejection fraction was 35%. Repeat echocardiogram in September of 2011 showed a normal ejection fraction of 65-70%. Repeat Echo in 2017 also showed normal function.   On follow up today she reports she is doing well. She has no chest pain or dyspnea. She walks a lot. Hasn't really checked BP at home.   Current Outpatient Medications on File Prior to Visit  Medication Sig Dispense Refill   amLODipine (NORVASC) 5 MG tablet Take 1 tablet (5 mg total) by mouth daily. 90 tablet 3   aspirin EC 81 MG tablet Take 1 tablet (81 mg total) by mouth daily.     glimepiride (AMARYL) 2 MG tablet Take 2 mg by mouth daily.      glucose blood test strip TEST ONCE DAILY     IRON PO Take 27 mg by mouth daily.      levETIRAcetam (KEPPRA) 500 MG tablet Take 1 tablet by mouth twice daily 180 tablet 0   metoprolol tartrate (LOPRESSOR) 50 MG tablet TAKE ONE TABLET BY MOUTH TWICE DAILY 180 tablet 3   Omega-3 Fatty Acids (FISH OIL) 1000 MG CAPS Take by mouth daily.     Potassium 99 MG TABS Take 1 tablet by mouth daily.     ramipril (ALTACE) 10 MG capsule Take 1 capsule (10 mg total) by mouth 2 (two) times daily. 180 capsule 3   No current facility-administered medications on file prior to visit.    No Known Allergies  Past Medical History:  Diagnosis Date   Coronary artery disease    a. 09/2007 s/p NSTEMI;  b. 04/2008 s/p CABG x 4 (LIMA->LAD, VG->D1, VG->RI, VG->dRCA).   Diabetes mellitus    Hyperlipidemia    Hypertension    Ischemic cardiomyopathy    a. 2010 EF 35-40%;  b. 09/2009 EF 65-70%.   Mitral regurgitation    a.04/2008 s/p MVR @ time of CABG w/ a 42mm Edwards Physio II annuloplasty ring (model # [id number], ser # [id number]).   Non-ST elevation MI  (NSTEMI) (HCC) 09/2007   hx of   PFO (patent foramen ovale)    a. 04/2008 s/p PFO closure @ time of CABG.   Seizure (HCC)    a. 07/2014 ED visit for seizure vs syncope.   Sepsis following intra-abdominal surgery (HCC)    Status post CVA     Past Surgical History:  Procedure Laterality Date   CARDIAC CATHETERIZATION  04/08/2008   CORONARY ARTERY BYPASS GRAFT  2010   lima graft to lad,saphenous vein graft tothe diagonal,saphenous vein graft to the intermediate branch  and saphenous vein graft to the distal right coronary   MITRAL VALVE REPAIR     status post partial colectomy      Social History   Tobacco Use  Smoking Status Never  Smokeless Tobacco Never    Social History   Substance and Sexual Activity  Alcohol Use No    Family History  Problem Relation Age of Onset   Hypertension Mother    Melanoma Mother    Heart disease Father        PPM    Review of Systems: As noted in history of present illness.  All other systems were reviewed and are negative.  Physical Exam: BP Marland Kitchen)  150/86   Pulse 65   Ht 5\' 1"  (1.549 m)   Wt 169 lb 4.8 oz (76.8 kg)   SpO2 97%   BMI 31.99 kg/m  GENERAL:  Well appearing WF in NAD HEENT:  PERRL, EOMI, sclera are clear. Oropharynx is clear. NECK:  No jugular venous distention, carotid upstroke brisk and symmetric, no bruits, no thyromegaly or adenopathy LUNGS:  Clear to auscultation bilaterally CHEST:  Unremarkable HEART:  RRR,  PMI not displaced or sustained,S1 and S2 within normal limits, no S3, no S4: no clicks, no rubs, no murmurs ABD:  Soft, nontender. BS +, no masses or bruits. No hepatomegaly, no splenomegaly EXT:  2 + pulses throughout, no edema, no cyanosis no clubbing SKIN:  Warm and dry.  No rashes NEURO:  Alert and oriented x 3. Cranial nerves II through XII intact. PSYCH:  Cognitively intact   Ecg today shows NSR rate 65, old septal infarct. LAD. I have personally reviewed and interpreted this study.   LABORATORY  DATA: Labs dated 02/18/16: normal BMET. Dated 02/20/17: A1c 6.8%. Glucose 200. Otherwise CMET normal. Cholesterol 137, triglycerides 233, HDL 33, LDL 69.  Dated 03/26/19: A1c 7.2%. triglycerides 191, cholesterol 130, HDL 32, LDL 60. Glucose 188, CMET normal. CBC normal.  Dated 09/23/20: Hgb 13.2, plts 134K. WBC 3.8. glucose 159, cholesterol 112, triglycerides 197, HDL 27, LDL 52. Otherwise CMET normal. A1c 7.2%   Echo: 09/01/15:Study Conclusions   - Left ventricle: The cavity size was normal. Wall thickness was   normal. Systolic function was normal. The estimated ejection   fraction was in the range of 60% to 65%. Wall motion was normal;   there were no regional wall motion abnormalities. Left   ventricular diastolic function parameters were normal. - Mitral valve: Prior procedures included surgical repair. The   findings are consistent with mild stenosis. There was mild   regurgitation. - Pulmonary arteries: Systolic pressure was mildly increased. PA   peak pressure: 32 mm Hg (S).   Assessment / Plan: 1. Coronary disease status post CABG in 2010. She is asymptomatic. Ejection fraction was normal in 2017. We will continue on aspirin, metoprolol, and statin therapy.   2. Seizure disorder. Now on Keppra without recurrence.   3. Hypertension. Blood pressure is up  today. Continue amlodipine, metoprolol and ramipril. I asked her to check her BP at home and let me know if it is staying elevated.   4. Hypercholesterolemia on chronic statin therapy. LDL is at goal < 70.   5. DM type 2. Per primary care. Last A1c 7.2%.   I will follow up in one year

## 2020-12-07 ENCOUNTER — Other Ambulatory Visit: Payer: Self-pay | Admitting: Cardiology

## 2020-12-08 ENCOUNTER — Other Ambulatory Visit: Payer: Self-pay

## 2020-12-08 ENCOUNTER — Encounter: Payer: Self-pay | Admitting: Cardiology

## 2020-12-08 ENCOUNTER — Ambulatory Visit: Payer: Medicare PPO | Admitting: Cardiology

## 2020-12-08 VITALS — BP 150/86 | HR 65 | Ht 61.0 in | Wt 169.3 lb

## 2020-12-08 DIAGNOSIS — E119 Type 2 diabetes mellitus without complications: Secondary | ICD-10-CM

## 2020-12-08 DIAGNOSIS — Z951 Presence of aortocoronary bypass graft: Secondary | ICD-10-CM | POA: Diagnosis not present

## 2020-12-08 DIAGNOSIS — I25810 Atherosclerosis of coronary artery bypass graft(s) without angina pectoris: Secondary | ICD-10-CM | POA: Diagnosis not present

## 2020-12-08 DIAGNOSIS — I10 Essential (primary) hypertension: Secondary | ICD-10-CM

## 2020-12-08 DIAGNOSIS — E7800 Pure hypercholesterolemia, unspecified: Secondary | ICD-10-CM

## 2020-12-08 MED ORDER — ROSUVASTATIN CALCIUM 20 MG PO TABS: 20.0000 mg | ORAL_TABLET | Freq: Every day | ORAL | 3 refills | Status: DC

## 2020-12-08 MED ORDER — RAMIPRIL 10 MG PO CAPS: 10.0000 mg | ORAL_CAPSULE | Freq: Two times a day (BID) | ORAL | 3 refills | Status: DC

## 2020-12-08 MED ORDER — AMLODIPINE BESYLATE 5 MG PO TABS: 5.0000 mg | ORAL_TABLET | Freq: Every day | ORAL | 3 refills | Status: DC

## 2020-12-08 MED ORDER — METOPROLOL TARTRATE 50 MG PO TABS: ORAL_TABLET | ORAL | 3 refills | Status: DC

## 2020-12-08 NOTE — Patient Instructions (Signed)
Monitor your Blood pressure at home. Let me know if it is staying over 140 systolic

## 2020-12-08 NOTE — Addendum Note (Signed)
Addended by: Neoma Laming on: 12/08/2020 12:00 PM   Modules accepted: Orders

## 2021-07-13 ENCOUNTER — Other Ambulatory Visit: Payer: Self-pay | Admitting: Cardiology

## 2021-07-21 ENCOUNTER — Other Ambulatory Visit: Payer: Self-pay | Admitting: Cardiology

## 2021-08-03 ENCOUNTER — Encounter: Payer: Self-pay | Admitting: Neurology

## 2021-08-03 ENCOUNTER — Ambulatory Visit (INDEPENDENT_AMBULATORY_CARE_PROVIDER_SITE_OTHER): Payer: Medicare PPO | Admitting: Neurology

## 2021-08-03 VITALS — BP 164/76 | HR 71 | Ht 61.0 in | Wt 169.0 lb

## 2021-08-03 DIAGNOSIS — G40009 Localization-related (focal) (partial) idiopathic epilepsy and epileptic syndromes with seizures of localized onset, not intractable, without status epilepticus: Secondary | ICD-10-CM | POA: Diagnosis not present

## 2021-08-03 MED ORDER — LEVETIRACETAM 500 MG PO TABS: 500.0000 mg | ORAL_TABLET | Freq: Two times a day (BID) | ORAL | 3 refills | Status: DC

## 2021-08-03 NOTE — Progress Notes (Signed)
NEUROLOGY FOLLOW UP OFFICE NOTE  [name] [id number] [birth year]  HISTORY OF PRESENT ILLNESS: I had the pleasure of seeing [name] in follow-up in the neurology clinic on 08/03/2021.  The patient was last seen a year ago for well-controlled seizures. She is again accompanied by her husband who helps supplement the history today. Since her last visit, she continues to do well seizure-free since January 2018 on Levetiracetam 500mg  twice a day, no side effects. They deny any staring/unresponsive episodes, gaps in time, olfactory/gustatory hallucinations, focal numbness/tingling/weakness, myoclonic jerks. She has been having more issues with hypertension, BP today 164/76, but denies any headaches, dizziness, vision changes, no falls. Sleep and mood are good. Her husband notes she stresses so much.    History on Initial Assessment 05/17/2016: This is a pleasant 81 yo RH woman with a history of hypertension, hyperlipidemia, diabetes, CAD s/p MI, mitral valve repair and closure of patent foramen ovale in 2010, stroke with no residual deficits, with seizures. The first episode occurred in 01/04/2015 while in church, she was sitting and unresponsive with eyes rolled up for a few minutes. She vomited after. She did not seek medical attention at that time. On 08/13/15, she woke up her husband making a loud noise, her tongue was stuck out to one side, she was snorting and limp like a ragdoll for 5-6 minutes. No incontinence. She woke up with EMS in her house. She was brought to [facility] ER where CBC and CMP were unremarkable except for elevated blood glucose. Lactic acid was elevated 2.66. Head CT did not show any acute changes, there was an old right cerebellar infarct seen. She had a carotid ultrasound done which was reported as unremarkable. The third episode occurred on 02/05/16, she was again asleep and awakened her husband with jerking movements, she was rigid with her arms flexed forward lasting a few  minutes, no tongue bite or incontinence. She was confused afterwards. Vital signs were normal. She had an EEG which showed occasional focal slowing over the left temporal region. I personally reviewed MRI brain with and without contrast which did not show any acute changes. There was a remote inferior right cerebellar infarct, mild atrophy and chronic microvascular disease, hippocampi symmetric without abnormal signal or enhancement. She was discharged home on Keppra 500mg  BID with no further similar episodes since January. Her husband denies any significant changes except she is more talkative than she used to be, which he likes. She can be more combative when they have discussions, which is definitely a small personality change for her. She feels her memory is pretty good, she feels she has more energy. She stopped driving 8 years ago. She denies any missed medications, she cooks without difficulties, her husband is in charge of bills. No difficulties with ADLs. She had a normal birth and early development.  There is no history of febrile convulsions, CNS infections such as meningitis/encephalitis, significant traumatic brain injury, neurosurgical procedures, or family history of seizures.    PAST MEDICAL HISTORY: Past Medical History:  Diagnosis Date   Coronary artery disease    a. 09/2007 s/p NSTEMI;  b. 04/2008 s/p CABG x 4 (LIMA->LAD, VG->D1, VG->RI, VG->dRCA).   Diabetes mellitus    Hyperlipidemia    Hypertension    Ischemic cardiomyopathy    a. 2010 EF 35-40%;  b. 09/2009 EF 65-70%.   Mitral regurgitation    a.04/2008 s/p MVR @ time of CABG w/ a 54mm Edwards Physio II annuloplasty ring (model # [id number], ser #  [id number]).   Non-ST elevation MI (NSTEMI) (HCC) 09/2007   hx of   PFO (patent foramen ovale)    a. 04/2008 s/p PFO closure @ time of CABG.   Seizure (HCC)    a. 07/2014 ED visit for seizure vs syncope.   Sepsis following intra-abdominal surgery (HCC)    Status post CVA      MEDICATIONS: Current Outpatient Medications on File Prior to Visit  Medication Sig Dispense Refill   amLODipine (NORVASC) 5 MG tablet Take 1 tablet by mouth once daily 90 tablet 0   aspirin EC 81 MG tablet Take 1 tablet (81 mg total) by mouth daily.     glimepiride (AMARYL) 2 MG tablet Take 2 mg by mouth daily.      glucose blood test strip TEST ONCE DAILY     IRON PO Take 27 mg by mouth daily.      levETIRAcetam (KEPPRA) 500 MG tablet Take 1 tablet by mouth twice daily 180 tablet 0   metoprolol tartrate (LOPRESSOR) 50 MG tablet TAKE ONE TABLET BY MOUTH TWICE DAILY 180 tablet 3   Omega-3 Fatty Acids (FISH OIL) 1000 MG CAPS Take by mouth daily.     Potassium 99 MG TABS Take 1 tablet by mouth daily.     ramipril (ALTACE) 10 MG capsule Take 1 capsule by mouth twice daily 180 capsule 1   rosuvastatin (CRESTOR) 20 MG tablet Take 1 tablet (20 mg total) by mouth daily. 90 tablet 3   No current facility-administered medications on file prior to visit.    ALLERGIES: No Known Allergies  FAMILY HISTORY: Family History  Problem Relation Age of Onset   Hypertension Mother    Melanoma Mother    Heart disease Father        PPM    SOCIAL HISTORY: Social History   Socioeconomic History   Marital status: Married    Spouse name: Not on file   Number of children: 3   Years of education: Not on file   Highest education level: Not on file  Occupational History   Not on file  Tobacco Use   Smoking status: Never   Smokeless tobacco: Never  Vaping Use   Vaping Use: Never used  Substance and Sexual Activity   Alcohol use: No   Drug use: No   Sexual activity: Not Currently    Partners: Male  Other Topics Concern   Not on file  Social History Narrative   Right handed      Highest Level of edu- 11th grade      Lives with husband   Social Determinants of Health   Financial Resource Strain: Not on file  Food Insecurity: Not on file  Transportation Needs: Not on file  Physical  Activity: Not on file  Stress: Not on file  Social Connections: Not on file  Intimate Partner Violence: Not on file     PHYSICAL EXAM: Vitals:   08/03/21 1457  BP: (!) 164/76  Pulse: 71  SpO2: 97%   General: No acute distress Head:  Normocephalic/atraumatic Skin/Extremities: No rash, no edema Neurological Exam: alert and awake. No aphasia or dysarthria. Fund of knowledge is appropriate. Attention and concentration are normal.   Cranial nerves: Pupils equal, round. Extraocular movements intact with no nystagmus. Visual fields full.  No facial asymmetry.  Motor: Bulk and tone normal, muscle strength 5/5 throughout with no pronator drift.   Finger to nose testing intact.  Gait narrow-based and steady, no ataxia. Romberg negative.  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 She had an episode of unresponsiveness in December 2016, then a nocturnal seizure in July 2017 and January 2018. MRI brain no acute changes, there is an old right cerebellar stroke. EEG had shown occasional left temporal slowing. Seizures suggestive of focal to bilateral tonic-clonic epilepsy, etiology unclear. She continues to do well seizure-free since January 2018 on Levetiracetam 500mg  BID. She asks about duration of therapy, we discussed risks and benefits of continued seizure prophylaxis, we can do an EEG and if normal, wean off medication, however if seizure recurs, long-term therapy is indicated. She opted to stay on medication. She does not drive. Follow-up in 1 year, call for any changes.   Thank you for allowing me to participate in her care.  Please do not hesitate to call for any questions or concerns.    , M.D.   CC: Patrcia Dolly, PA-C

## 2021-08-03 NOTE — Patient Instructions (Signed)
Always good to see you. Continue Keppra 500mg twice a day. Follow-up in 1 year, call for any changes.  ° ° °Seizure Precautions: °1. If medication has been prescribed for you to prevent seizures, take it exactly as directed.  Do not stop taking the medicine without talking to your doctor first, even if you have not had a seizure in a long time.  ° °2. Avoid activities in which a seizure would cause danger to yourself or to others.  Don't operate dangerous machinery, swim alone, or climb in high or dangerous places, such as on ladders, roofs, or girders.  Do not drive unless your doctor says you may. ° °3. If you have any warning that you may have a seizure, lay down in a safe place where you can't hurt yourself.   ° °4.  No driving for 6 months from last seizure, as per [place] state law.   Please refer to the following link on the [employer]'s website for more information: http://www.epilepsyfoundation.org/answerplace/Social/driving/drivingu.cfm  ° °5.  Maintain good sleep hygiene. Avoid alcohol. ° °6.  Contact your doctor if you have any problems that may be related to the medicine you are taking. ° °7.  Call 911 and bring the patient back to the ED if: °      ° A.  The seizure lasts longer than 5 minutes.      ° B.  The patient doesn't awaken shortly after the seizure ° C.  The patient has new problems such as difficulty seeing, speaking or moving ° D.  The patient was injured during the seizure ° E.  The patient has a temperature over 102 F (39C) ° F.  The patient vomited and now is having trouble breathing °      ° °

## 2021-12-08 NOTE — Progress Notes (Signed)
[name] Date of Birth: 10/11/1940 Medical Record #[id number]  History of Present Illness: [name] is seen for  followup CAD. She has a history of coronary disease and is status post coronary bypass surgery in 2010. Prior to her bypass surgery her ejection fraction was 35%. Repeat echocardiogram in September of 2011 showed a normal ejection fraction of 65-70%. Repeat Echo in 2017 also showed normal function.   On follow up today she reports she is doing well. She has no chest pain or dyspnea. She walks a lot daily. Reports BP is good at home and when last seen by PCP it was 122/84.   Current Outpatient Medications on File Prior to Visit  Medication Sig Dispense Refill   aspirin EC 81 MG tablet Take 1 tablet (81 mg total) by mouth daily.     glimepiride (AMARYL) 2 MG tablet Take 2 mg by mouth daily.      glucose blood test strip TEST ONCE DAILY     IRON PO Take 27 mg by mouth daily.      levETIRAcetam (KEPPRA) 500 MG tablet Take 1 tablet (500 mg total) by mouth 2 (two) times daily. 180 tablet 3   Omega-3 Fatty Acids (FISH OIL) 1000 MG CAPS Take by mouth daily.     Potassium 99 MG TABS Take 1 tablet by mouth daily.     No current facility-administered medications on file prior to visit.    No Known Allergies  Past Medical History:  Diagnosis Date   Coronary artery disease    a. 09/2007 s/p NSTEMI;  b. 04/2008 s/p CABG x 4 (LIMA->LAD, VG->D1, VG->RI, VG->dRCA).   Diabetes mellitus    Hyperlipidemia    Hypertension    Ischemic cardiomyopathy    a. 2010 EF 35-40%;  b. 09/2009 EF 65-70%.   Mitral regurgitation    a.04/2008 s/p MVR @ time of CABG w/ a 78mm Edwards Physio II annuloplasty ring (model # [id number], ser # [id number]).   Non-ST elevation MI (NSTEMI) (HCC) 09/2007   hx of   PFO (patent foramen ovale)    a. 04/2008 s/p PFO closure @ time of CABG.   Seizure (HCC)    a. 07/2014 ED visit for seizure vs syncope.   Sepsis following intra-abdominal surgery (HCC)    Status  post CVA     Past Surgical History:  Procedure Laterality Date   CARDIAC CATHETERIZATION  04/08/2008   CORONARY ARTERY BYPASS GRAFT  2010   lima graft to lad,saphenous vein graft tothe diagonal,saphenous vein graft to the intermediate branch  and saphenous vein graft to the distal right coronary   MITRAL VALVE REPAIR     status post partial colectomy      Social History   Tobacco Use  Smoking Status Never  Smokeless Tobacco Never    Social History   Substance and Sexual Activity  Alcohol Use No    Family History  Problem Relation Age of Onset   Hypertension Mother    Melanoma Mother    Heart disease Father        PPM    Review of Systems: As noted in history of present illness.  All other systems were reviewed and are negative.  Physical Exam: BP (!) 150/92 (BP Location: Right Arm, Patient Position: Sitting, Cuff Size: Normal)   Pulse 65   Ht 5\' 1"  (1.549 m)   Wt 168 lb 6.4 oz (76.4 kg)   SpO2 98%   BMI 31.82 kg/m  GENERAL:  Well appearing WF in NAD HEENT:  PERRL, EOMI, sclera are clear. Oropharynx is clear. NECK:  No jugular venous distention, carotid upstroke brisk and symmetric, no bruits, no thyromegaly or adenopathy LUNGS:  Clear to auscultation bilaterally CHEST:  Unremarkable HEART:  RRR,  PMI not displaced or sustained,S1 and S2 within normal limits, no S3, no S4: no clicks, no rubs, no murmurs ABD:  Soft, nontender. BS +, no masses or bruits. No hepatomegaly, no splenomegaly EXT:  2 + pulses throughout, no edema, no cyanosis no clubbing SKIN:  Warm and dry.  No rashes NEURO:  Alert and oriented x 3. Cranial nerves II through XII intact. PSYCH:  Cognitively intact   Ecg today shows NSR rate 65, old septal infarct. LAD. No change. I have personally reviewed and interpreted this study.   LABORATORY DATA: Labs dated 02/18/16: normal BMET. Dated 02/20/17: A1c 6.8%. Glucose 200. Otherwise CMET normal. Cholesterol 137, triglycerides 233, HDL 33, LDL 69.   Dated 03/26/19: A1c 7.2%. triglycerides 191, cholesterol 130, HDL 32, LDL 60. Glucose 188, CMET normal. CBC normal.  Dated 09/23/20: Hgb 13.2, plts 134K. WBC 3.8. glucose 159, cholesterol 112, triglycerides 197, HDL 27, LDL 52. Otherwise CMET normal. A1c 7.2% Dated 10/06/21: glucose 182, otherwise CMET normal. A1c 7.3%. CBC normal Cholesterol 147, triglycerides 319, HDL 33, LDL 55   Echo: 09/01/15:Study Conclusions   - Left ventricle: The cavity size was normal. Wall thickness was   normal. Systolic function was normal. The estimated ejection   fraction was in the range of 60% to 65%. Wall motion was normal;   there were no regional wall motion abnormalities. Left   ventricular diastolic function parameters were normal. - Mitral valve: Prior procedures included surgical repair. The   findings are consistent with mild stenosis. There was mild   regurgitation. - Pulmonary arteries: Systolic pressure was mildly increased. PA   peak pressure: 32 mm Hg (S).   Assessment / Plan: 1. Coronary disease status post CABG in 2010. She is asymptomatic. Ejection fraction was normal in 2017. We will continue on aspirin, metoprolol, and statin therapy.   2. Seizure disorder.  3. Hypertension. Blood pressure is up  today. Continue amlodipine, metoprolol and ramipril.patient reports good readings at home and with PCP  4. Hypercholesterolemia on chronic statin therapy. LDL is at goal < 70.   5. DM type 2. Per primary care. Last A1c 7.2%.   I will follow up in one year

## 2021-12-13 ENCOUNTER — Ambulatory Visit: Payer: Medicare PPO | Attending: Cardiology | Admitting: Cardiology

## 2021-12-13 VITALS — BP 150/92 | HR 65 | Ht 61.0 in | Wt 168.4 lb

## 2021-12-13 DIAGNOSIS — E7800 Pure hypercholesterolemia, unspecified: Secondary | ICD-10-CM

## 2021-12-13 DIAGNOSIS — Z951 Presence of aortocoronary bypass graft: Secondary | ICD-10-CM | POA: Diagnosis not present

## 2021-12-13 DIAGNOSIS — I10 Essential (primary) hypertension: Secondary | ICD-10-CM | POA: Diagnosis not present

## 2021-12-13 DIAGNOSIS — I25810 Atherosclerosis of coronary artery bypass graft(s) without angina pectoris: Secondary | ICD-10-CM

## 2021-12-13 MED ORDER — RAMIPRIL 10 MG PO CAPS
10.0000 mg | ORAL_CAPSULE | Freq: Two times a day (BID) | ORAL | 3 refills | Status: DC
Start: 2021-12-13 — End: 2023-01-10

## 2021-12-13 MED ORDER — AMLODIPINE BESYLATE 5 MG PO TABS: 5.0000 mg | ORAL_TABLET | Freq: Every day | ORAL | 3 refills | Status: DC

## 2021-12-13 MED ORDER — METOPROLOL TARTRATE 50 MG PO TABS: ORAL_TABLET | ORAL | 3 refills | Status: DC

## 2021-12-13 MED ORDER — ROSUVASTATIN CALCIUM 20 MG PO TABS: 20.0000 mg | ORAL_TABLET | Freq: Every day | ORAL | 3 refills | Status: DC

## 2022-02-02 ENCOUNTER — Encounter: Payer: Self-pay | Admitting: Neurology

## 2022-08-05 ENCOUNTER — Encounter: Payer: Self-pay | Admitting: Neurology

## 2022-08-05 ENCOUNTER — Ambulatory Visit (INDEPENDENT_AMBULATORY_CARE_PROVIDER_SITE_OTHER): Payer: Medicare PPO | Admitting: Neurology

## 2022-08-05 VITALS — BP 158/79 | HR 64 | Ht 61.0 in | Wt 168.0 lb

## 2022-08-05 DIAGNOSIS — G40009 Localization-related (focal) (partial) idiopathic epilepsy and epileptic syndromes with seizures of localized onset, not intractable, without status epilepticus: Secondary | ICD-10-CM | POA: Diagnosis not present

## 2022-08-05 MED ORDER — LEVETIRACETAM 500 MG PO TABS
500.0000 mg | ORAL_TABLET | Freq: Two times a day (BID) | ORAL | 3 refills | Status: DC
Start: 2022-08-05 — End: 2023-07-24

## 2022-08-05 NOTE — Patient Instructions (Addendum)
Always good to see you. Continue Keppra (Levetiracetam) 500mg : take 1 tablet twice a day.   Increase water intake.  Keep an eye on the drowsiness, if it increases, I would recommend doing a home sleep study.  Follow-up in 1 year, call for any changes.   Seizure Precautions: 1. If medication has been prescribed for you to prevent seizures, take it exactly as directed.  Do not stop taking the medicine without talking to your doctor first, even if you have not had a seizure in a long time.   2. Avoid activities in which a seizure would cause danger to yourself or to others.  Don't operate dangerous machinery, swim alone, or climb in high or dangerous places, such as on ladders, roofs, or girders.  Do not drive unless your doctor says you may.  3. If you have any warning that you may have a seizure, lay down in a safe place where you can't hurt yourself.    4.  No driving for 6 months from last seizure, as per [facility].   Please refer to the following link on the [employer]ile[employer]'s website for more information: http://www.epilepsyfoundation.org/answerplace/Social/driving/drivingu.cfm   5.  Maintain good sleep hygiene. Avoid alcohol.  6.  Contact your doctor if you have any problems that may be related to the medicine you are taking.  7.  Call 911 and bring the patient back to the ED if:        A.  The seizure lasts longer than 5 minutes.       B.  The patient doesn't awaken shortly after the seizure  C.  The patient has new problems such as difficulty seeing, speaking or moving  D.  The patient was injured during the seizure  E.  The patient has a temperature over 102 F (39C)  F.  The patient vomited and now is having trouble breathing

## 2022-08-05 NOTE — Progress Notes (Signed)
NEUROLOGY FOLLOW UP OFFICE NOTE  [name] [id number] July 04, 1940  HISTORY OF PRESENT ILLNESS: I had the pleasure of seeing [name] in follow-up in the neurology clinic on 08/05/2022.  The patient was last seen a year ago for well-controlled seizures. She is again accompanied by her husband who helps supplement the history today. Records and images were personally reviewed where available.  Since her last visit, she continues to do well seizure-free since 2018 on Levetiracetam 500mg  BID, no side effects. No staring/unresponsive episodes, gaps in time, olfactory/gustatory hallucinations, focal numbness/tingling/weakness, myoclonic jerks. Her husband reports rare quick jerks in her sleep, maybe 2 in the past year. No headaches, dizziness, vision changes, no falls. Mood is good. She states every time she sits down she goes to sleep. She takes a 3 hour nap then 7 hours sleep at night. She snores occasionally, her husband notes rare apneic episodes. She does not drive. Her husband notes she does not drink enough water (just 1 bottled water a day), lips are dry today, she repeatedly licks her lips.    History on Initial Assessment 05/17/2016: This is a pleasant 82 yo RH woman with a history of hypertension, hyperlipidemia, diabetes, CAD s/p MI, mitral valve repair and closure of patent foramen ovale in 2010, stroke with no residual deficits, with seizures. The first episode occurred in 01/04/2015 while in church, she was sitting and unresponsive with eyes rolled up for a few minutes. She vomited after. She did not seek medical attention at that time. On 08/13/15, she woke up her husband making a loud noise, her tongue was stuck out to one side, she was snorting and limp like a ragdoll for 5-6 minutes. No incontinence. She woke up with EMS in her house. She was brought to [facility] ER where CBC and CMP were unremarkable except for elevated blood glucose. Lactic acid was elevated 2.66. Head CT did not  show any acute changes, there was an old right cerebellar infarct seen. She had a carotid ultrasound done which was reported as unremarkable. The third episode occurred on 02/05/16, she was again asleep and awakened her husband with jerking movements, she was rigid with her arms flexed forward lasting a few minutes, no tongue bite or incontinence. She was confused afterwards. Vital signs were normal. She had an EEG which showed occasional focal slowing over the left temporal region. I personally reviewed MRI brain with and without contrast which did not show any acute changes. There was a remote inferior right cerebellar infarct, mild atrophy and chronic microvascular disease, hippocampi symmetric without abnormal signal or enhancement. She was discharged home on Keppra 500mg  BID with no further similar episodes since January. Her husband denies any significant changes except she is more talkative than she used to be, which he likes. She can be more combative when they have discussions, which is definitely a small personality change for her. She feels her memory is pretty good, she feels she has more energy. She stopped driving 8 years ago. She denies any missed medications, she cooks without difficulties, her husband is in charge of bills. No difficulties with ADLs. She had a normal birth and early development.  There is no history of febrile convulsions, CNS infections such as meningitis/encephalitis, significant traumatic brain injury, neurosurgical procedures, or family history of seizures.   PAST MEDICAL HISTORY: Past Medical History:  Diagnosis Date   Coronary artery disease    a. 09/2007 s/p NSTEMI;  b. 04/2008 s/p CABG x 4 (LIMA->LAD, VG->D1, VG->RI,  VG->dRCA).   Diabetes mellitus    Hyperlipidemia    Hypertension    Ischemic cardiomyopathy    a. 2010 EF 35-40%;  b. 09/2009 EF 65-70%.   Mitral regurgitation    a.04/2008 s/p MVR @ time of CABG w/ a 30mm Edwards Physio II annuloplasty ring (model #  [id number], ser # [id number]).   Non-ST elevation MI (NSTEMI) (HCC) 09/2007   hx of   PFO (patent foramen ovale)    a. 04/2008 s/p PFO closure @ time of CABG.   Seizure (HCC)    a. 07/2014 ED visit for seizure vs syncope.   Sepsis following intra-abdominal surgery (HCC)    Status post CVA     MEDICATIONS: Current Outpatient Medications on File Prior to Visit  Medication Sig Dispense Refill   amLODipine (NORVASC) 5 MG tablet Take 1 tablet (5 mg total) by mouth daily. 90 tablet 3   aspirin EC 81 MG tablet Take 1 tablet (81 mg total) by mouth daily.     glimepiride (AMARYL) 2 MG tablet Take 2 mg by mouth daily.      glucose blood test strip TEST ONCE DAILY     IRON PO Take 27 mg by mouth daily.      levETIRAcetam (KEPPRA) 500 MG tablet Take 1 tablet (500 mg total) by mouth 2 (two) times daily. 180 tablet 3   metoprolol tartrate (LOPRESSOR) 50 MG tablet TAKE ONE TABLET BY MOUTH TWICE DAILY 180 tablet 3   Omega-3 Fatty Acids (FISH OIL) 1000 MG CAPS Take by mouth daily.     Potassium 99 MG TABS Take 1 tablet by mouth daily.     ramipril (ALTACE) 10 MG capsule Take 1 capsule (10 mg total) by mouth 2 (two) times daily. 180 capsule 3   rosuvastatin (CRESTOR) 20 MG tablet Take 1 tablet (20 mg total) by mouth daily. 90 tablet 3   No current facility-administered medications on file prior to visit.    ALLERGIES: No Known Allergies  FAMILY HISTORY: Family History  Problem Relation Age of Onset   Hypertension Mother    Melanoma Mother    Heart disease Father        PPM    SOCIAL HISTORY: Social History   Socioeconomic History   Marital status: Married    Spouse name: Not on file   Number of children: 3   Years of education: Not on file   Highest education level: Not on file  Occupational History   Not on file  Tobacco Use   Smoking status: Never   Smokeless tobacco: Never  Vaping Use   Vaping status: Never Used  Substance and Sexual Activity   Alcohol use: No   Drug use: No    Sexual activity: Not Currently    Partners: Male  Other Topics Concern   Not on file  Social History Narrative   Right handed      Highest Level of edu- 11th grade      Lives with husband one story home   Caffeine none   Social Determinants of Health   Financial Resource Strain: Not on file  Food Insecurity: Not on file  Transportation Needs: Not on file  Physical Activity: Not on file  Stress: Not on file  Social Connections: Not on file  Intimate Partner Violence: Not on file     PHYSICAL EXAM: Vitals:   08/05/22 1503  BP: (!) 158/79  Pulse: 64  SpO2: 95%   General: No acute distress Head:  Normocephalic/atraumatic Skin/Extremities: No rash, no edema Neurological Exam: alert and awake. No aphasia or dysarthria. Fund of knowledge is appropriate.   Attention and concentration are normal.   Cranial nerves: Pupils equal, round. Extraocular movements intact with no nystagmus. Visual fields full.  No facial asymmetry.  Motor: Bulk and tone normal, muscle strength 5/5 throughout with no pronator drift.   Finger to nose testing intact.  Gait narrow-based and steady, no ataxia. 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 She had an episode of unresponsiveness in December 2016, then a nocturnal seizure in July 2017 and January 2018. MRI brain no acute changes, there is an old right cerebellar stroke. EEG had shown occasional left temporal slowing. Seizures suggestive of focal to bilateral tonic-clonic epilepsy, etiology unclear. She continues to do well seizure-free since 2018 on Levetiracetam 500mg  BID, refills sent. She does not drive and is aware of [place] driving laws to stop driving until 6 months seizure-free. We discussed daytime drowsiness  and doing a sleep study, she declines at this time and knows to call if symptoms worsen. Follow-up in 1 year, call for any changes.   Thank you for allowing me to participate in her care.  Please do not hesitate to call for any questions or concerns.    Patrcia Dolly, M.D.   CC: Mady Gemma, PA-C

## 2022-08-08 ENCOUNTER — Ambulatory Visit: Payer: Medicare PPO | Admitting: Neurology

## 2022-12-08 ENCOUNTER — Ambulatory Visit: Payer: Medicare PPO | Admitting: Nurse Practitioner

## 2023-01-10 ENCOUNTER — Other Ambulatory Visit: Payer: Self-pay | Admitting: Cardiology

## 2023-01-28 NOTE — Progress Notes (Signed)
[name] Date of Birth: 1940/05/18 Medical Record #[id number]  History of Present Illness: [name] is seen for  followup CAD. She has a history of coronary disease and is status post coronary bypass surgery in 2010. Prior to her bypass surgery her ejection fraction was 35%. Repeat echocardiogram in September of 2011 showed a normal ejection fraction of 65-70%. Repeat Echo in 2017 also showed normal function.   On follow up today she reports she is doing well. She has no chest pain or dyspnea. She walks a lot daily. Reports BP is good at home  around 125-130 systolic.   Current Outpatient Medications on File Prior to Visit  Medication Sig Dispense Refill   amLODipine (NORVASC) 5 MG tablet Take 1 tablet by mouth once daily 30 tablet 0   aspirin EC 81 MG tablet Take 1 tablet (81 mg total) by mouth daily.     glimepiride (AMARYL) 2 MG tablet Take 2 mg by mouth daily.      glucose blood test strip TEST ONCE DAILY     IRON PO Take 27 mg by mouth daily.      levETIRAcetam (KEPPRA) 500 MG tablet Take 1 tablet (500 mg total) by mouth 2 (two) times daily. 180 tablet 3   metoprolol tartrate (LOPRESSOR) 50 MG tablet Take 1 tablet by mouth twice daily 60 tablet 0   Omega-3 Fatty Acids (FISH OIL) 1000 MG CAPS Take by mouth daily.     Potassium 99 MG TABS Take 1 tablet by mouth daily.     ramipril (ALTACE) 10 MG capsule Take 1 capsule by mouth twice daily 60 capsule 0   rosuvastatin (CRESTOR) 20 MG tablet Take 1 tablet (20 mg total) by mouth daily. 90 tablet 3   No current facility-administered medications on file prior to visit.    No Known Allergies  Past Medical History:  Diagnosis Date   Coronary artery disease    a. 09/2007 s/p NSTEMI;  b. 04/2008 s/p CABG x 4 (LIMA->LAD, VG->D1, VG->RI, VG->dRCA).   Diabetes mellitus    Hyperlipidemia    Hypertension    Ischemic cardiomyopathy    a. 2010 EF 35-40%;  b. 09/2009 EF 65-70%.   Mitral regurgitation    a.04/2008 s/p MVR @ time  of CABG w/ a 30mm Edwards Physio II annuloplasty ring (model # [id number], ser # [id number]).   Non-ST elevation MI (NSTEMI) (HCC) 09/2007   hx of   PFO (patent foramen ovale)    a. 04/2008 s/p PFO closure @ time of CABG.   Seizure (HCC)    a. 07/2014 ED visit for seizure vs syncope.   Sepsis following intra-abdominal surgery (HCC)    Status post CVA     Past Surgical History:  Procedure Laterality Date   CARDIAC CATHETERIZATION  04/08/2008   CORONARY ARTERY BYPASS GRAFT  2010   lima graft to lad,saphenous vein graft tothe diagonal,saphenous vein graft to the intermediate branch  and saphenous vein graft to the distal right coronary   MITRAL VALVE REPAIR     status post partial colectomy      Social History   Tobacco Use  Smoking Status Never  Smokeless Tobacco Never    Social History   Substance and Sexual Activity  Alcohol Use No    Family History  Problem Relation Age of Onset   Hypertension Mother    Melanoma Mother    Heart disease Father        PPM    Review  of Systems: As noted in history of present illness.  All other systems were reviewed and are negative.  Physical Exam: BP 136/72 (BP Location: Right Arm, Cuff Size: Normal)   Pulse 69   Ht 5\' 1"  (1.549 m)   Wt 164 lb (74.4 kg)   SpO2 95%   BMI 30.99 kg/m  GENERAL:  Well appearing WF in NAD HEENT:  PERRL, EOMI, sclera are clear. Oropharynx is clear. NECK:  No jugular venous distention, carotid upstroke brisk and symmetric, no bruits, no thyromegaly or adenopathy LUNGS:  Clear to auscultation bilaterally CHEST:  Unremarkable HEART:  RRR,  PMI not displaced or sustained,S1 and S2 within normal limits, no S3, no S4: no clicks, no rubs, no murmurs ABD:  Soft, nontender. BS +, no masses or bruits. No hepatomegaly, no splenomegaly EXT:  2 + pulses throughout, no edema, no cyanosis no clubbing SKIN:  Warm and dry.  No rashes NEURO:  Alert and oriented x 3. Cranial nerves II through XII intact. PSYCH:  Cognitively  intact   EKG Interpretation Date/Time:  Monday February 06 2023 14:16:20 EST Ventricular Rate:  69 PR Interval:  146 QRS Duration:  104 QT Interval:  430 QTC Calculation: 460 R Axis:   -38  Text Interpretation: Normal sinus rhythm Left axis deviation Minimal voltage criteria for LVH, may be normal variant ( Cornell product ) Septal infarct , age undetermined When compared with ECG of Nov 27/2024 No significant change was found Confirmed by [place], Neila Teem [phone]) on 02/06/2023 2:24:01 PM     LABORATORY DATA: Labs dated 02/18/16: normal BMET. Dated 02/20/17: A1c 6.8%. Glucose 200. Otherwise CMET normal. Cholesterol 137, triglycerides 233, HDL 33, LDL 69.  Dated 03/26/19: A1c 7.2%. triglycerides 191, cholesterol 130, HDL 32, LDL 60. Glucose 188, CMET normal. CBC normal.  Dated 09/23/20: Hgb 13.2, plts 134K. WBC 3.8. glucose 159, cholesterol 112, triglycerides 197, HDL 27, LDL 52. Otherwise CMET normal. A1c 7.2% Dated 10/06/21: glucose 182, otherwise CMET normal. A1c 7.3%. CBC normal Cholesterol 147, triglycerides 319, HDL 33, LDL 55 Dated 10/12/22: glucose 172, otherwise CMET normal. A1c 7.9%. cholesterol 142,s Triglycerides 246, HDL 31, LDL 77.    Echo: 09/01/15:Study Conclusions   - Left ventricle: The cavity size was normal. Wall thickness was   normal. Systolic function was normal. The estimated ejection   fraction was in the range of 60% to 65%. Wall motion was normal;   there were no regional wall motion abnormalities. Left   ventricular diastolic function parameters were normal. - Mitral valve: Prior procedures included surgical repair. The   findings are consistent with mild stenosis. There was mild   regurgitation. - Pulmonary arteries: Systolic pressure was mildly increased. PA   peak pressure: 32 mm Hg (S).   Assessment / Plan: 1. Coronary disease status post CABG in 2010. She is asymptomatic. Ejection fraction was normal in 2017. We will continue on aspirin, metoprolol, and statin  therapy.   2. Seizure disorder.  3. Hypertension. Blood pressure is up  today. Continue amlodipine, metoprolol and ramipril. patient reports good readings at home and with PCP  4. Hypercholesterolemia on chronic statin therapy. LDL was 7.7. prior to that it was 55. Focus on weight loss and better diabetic control.   5. DM type 2. Per primary care. Last A1c 7.7%.   I will follow up in one year

## 2023-02-06 ENCOUNTER — Ambulatory Visit: Payer: Medicare PPO | Attending: Cardiology | Admitting: Cardiology

## 2023-02-06 ENCOUNTER — Encounter: Payer: Self-pay | Admitting: Cardiology

## 2023-02-06 VITALS — BP 136/72 | HR 69 | Ht 61.0 in | Wt 164.0 lb

## 2023-02-06 DIAGNOSIS — I10 Essential (primary) hypertension: Secondary | ICD-10-CM

## 2023-02-06 DIAGNOSIS — Z951 Presence of aortocoronary bypass graft: Secondary | ICD-10-CM | POA: Diagnosis not present

## 2023-02-06 DIAGNOSIS — I25810 Atherosclerosis of coronary artery bypass graft(s) without angina pectoris: Secondary | ICD-10-CM

## 2023-02-06 DIAGNOSIS — E7800 Pure hypercholesterolemia, unspecified: Secondary | ICD-10-CM | POA: Diagnosis not present

## 2023-02-06 MED ORDER — METOPROLOL TARTRATE 50 MG PO TABS: 50.0000 mg | ORAL_TABLET | Freq: Two times a day (BID) | ORAL | 3 refills | Status: DC

## 2023-02-06 MED ORDER — RAMIPRIL 10 MG PO CAPS: 10.0000 mg | ORAL_CAPSULE | Freq: Two times a day (BID) | ORAL | 3 refills | Status: DC

## 2023-02-06 MED ORDER — AMLODIPINE BESYLATE 5 MG PO TABS: 5.0000 mg | ORAL_TABLET | Freq: Every day | ORAL | 3 refills | Status: DC

## 2023-02-06 MED ORDER — ROSUVASTATIN CALCIUM 20 MG PO TABS: 20.0000 mg | ORAL_TABLET | Freq: Every day | ORAL | 3 refills | Status: DC

## 2023-02-06 NOTE — Patient Instructions (Signed)

## 2023-03-01 ENCOUNTER — Other Ambulatory Visit: Payer: Self-pay | Admitting: Cardiology

## 2023-07-24 ENCOUNTER — Encounter: Payer: Self-pay | Admitting: Neurology

## 2023-07-24 ENCOUNTER — Ambulatory Visit: Payer: Medicare PPO | Admitting: Neurology

## 2023-07-24 VITALS — BP 165/74 | HR 68 | Ht 61.0 in | Wt 169.4 lb

## 2023-07-24 DIAGNOSIS — G40009 Localization-related (focal) (partial) idiopathic epilepsy and epileptic syndromes with seizures of localized onset, not intractable, without status epilepticus: Secondary | ICD-10-CM | POA: Diagnosis not present

## 2023-07-24 MED ORDER — LEVETIRACETAM 500 MG PO TABS: 500.0000 mg | ORAL_TABLET | Freq: Two times a day (BID) | ORAL | 4 refills | Status: AC
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
## 2023-07-24 NOTE — Patient Instructions (Signed)
 It's always a pleasure to see you.  Continue Keppra  (Levetiracetam ) 500mg  twice a day  2. Let me know if you would like to proceed with a sleep study  3. Follow-up in 1 year, call for any changes   Seizure Precautions: 1. If medication has been prescribed for you to prevent seizures, take it exactly as directed.  Do not stop taking the medicine without talking to your doctor first, even if you have not had a seizure in a long time.   2. Avoid activities in which a seizure would cause danger to yourself or to others.  Don't operate dangerous machinery, swim alone, or climb in high or dangerous places, such as on ladders, roofs, or girders.  Do not drive unless your doctor says you may.  3. If you have any warning that you may have a seizure, lay down in a safe place where you can't hurt yourself.    4.  No driving for 6 months from last seizure, as per   state law.   Please refer to the following link on the [employer]'s website for more information: http://www.epilepsyfoundation.org/answerplace/Social/driving/drivingu.cfm   5.  Maintain good sleep hygiene. Avoid alcohol.  6.  Contact your doctor if you have any problems that may be related to the medicine you are taking.  7.  Call 911 and bring the patient back to the ED if:        A.  The seizure lasts longer than 5 minutes.       B.  The patient doesn't awaken shortly after the seizure  C.  The patient has new problems such as difficulty seeing, speaking or moving  D.  The patient was injured during the seizure  E.  The patient has a temperature over 102 F (39C)  F.  The patient vomited and now is having trouble breathing

## 2023-12-06 ENCOUNTER — Encounter: Payer: Self-pay | Admitting: Neurology

## 2024-01-24 ENCOUNTER — Other Ambulatory Visit: Payer: Self-pay | Admitting: Cardiology

## 2024-02-05 NOTE — Progress Notes (Unsigned)
 "  [name] Date of Birth: 1940/10/04 Medical Record #[id number]  History of Present Illness: [name] is seen for  followup CAD. She has a history of coronary disease and is status post coronary bypass surgery in 2010. Prior to her bypass surgery her ejection fraction was 35%. Repeat echocardiogram in September of 2011 showed a normal ejection fraction of 65-70%. Repeat Echo in 2017 also showed normal function.   On follow up today she reports she is doing well. She has no chest pain or dyspnea. She walks a lot daily. Reports BP is good at home  around 125-130 systolic.   Current Outpatient Medications on File Prior to Visit  Medication Sig Dispense Refill   amLODipine  (NORVASC ) 5 MG tablet Take 1 tablet by mouth once daily 90 tablet 0   aspirin  EC 81 MG tablet Take 1 tablet (81 mg total) by mouth daily.     GARLIC PO Take by mouth.     glimepiride  (AMARYL ) 2 MG tablet Take 2 mg by mouth daily.      glucose blood test strip TEST ONCE DAILY     IRON PO Take 27 mg by mouth daily.      levETIRAcetam  (KEPPRA ) 500 MG tablet Take 1 tablet (500 mg total) by mouth 2 (two) times daily. 180 tablet 4   metoprolol  tartrate (LOPRESSOR ) 50 MG tablet Take 1 tablet (50 mg total) by mouth 2 (two) times daily. 180 tablet 3   Omega-3 Fatty Acids (FISH OIL) 1000 MG CAPS Take by mouth daily.     Potassium 99 MG TABS Take 1 tablet by mouth daily.     ramipril  (ALTACE ) 10 MG capsule Take 1 capsule (10 mg total) by mouth 2 (two) times daily. 180 capsule 3   rosuvastatin  (CRESTOR ) 20 MG tablet Take 1 tablet by mouth once daily 90 tablet 3   No current facility-administered medications on file prior to visit.    No Known Allergies  Past Medical History:  Diagnosis Date   Coronary artery disease    a. 09/2007 s/p NSTEMI;  b. 04/2008 s/p CABG x 4 (LIMA->LAD, VG->D1, VG->RI, VG->dRCA).   Diabetes mellitus    Hyperlipidemia    Hypertension    Ischemic cardiomyopathy    a. 2010 EF 35-40%;  b.  09/2009 EF 65-70%.   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   Non-ST elevation MI (NSTEMI) (HCC) 09/2007   hx of   PFO (patent foramen ovale)    a. 04/2008 s/p PFO closure @ time of CABG.   Seizure (HCC)    a. 07/2014 ED visit for seizure vs syncope.   Sepsis following intra-abdominal surgery    Status post CVA     Past Surgical History:  Procedure Laterality Date   CARDIAC CATHETERIZATION  04/08/2008   CORONARY ARTERY BYPASS GRAFT  2010   lima graft to lad,saphenous vein graft tothe diagonal,saphenous vein graft to the intermediate branch  and saphenous vein graft to the distal right coronary   MITRAL VALVE REPAIR     status post partial colectomy      Social History   Tobacco Use  Smoking Status Never  Smokeless Tobacco Never    Social History   Substance and Sexual Activity  Alcohol Use No    Family History  Problem Relation Age of Onset   Hypertension Mother    Melanoma Mother  Heart disease Father        PPM    Review of Systems: As noted in history of present illness.  All other systems were reviewed and are negative.  Physical Exam: There were no vitals taken for this visit. GENERAL:  Well appearing WF in NAD HEENT:  PERRL, EOMI, sclera are clear. Oropharynx is clear. NECK:  No jugular venous distention, carotid upstroke brisk and symmetric, no bruits, no thyromegaly or adenopathy LUNGS:  Clear to auscultation bilaterally CHEST:  Unremarkable HEART:  RRR,  PMI not displaced or sustained,S1 and S2 within normal limits, no S3, no S4: no clicks, no rubs, no murmurs ABD:  Soft, nontender. BS +, no masses or bruits. No hepatomegaly, no splenomegaly EXT:  2 + pulses throughout, no edema, no cyanosis no clubbing SKIN:  Warm and dry.  No rashes NEURO:  Alert and oriented x 3. Cranial nerves II through XII intact. PSYCH:  Cognitively intact         LABORATORY  DATA: Labs dated 02/18/16: normal BMET. Dated 02/20/17: A1c 6.8%. Glucose 200. Otherwise CMET normal. Cholesterol 137, triglycerides 233, HDL 33, LDL 69.  Dated 03/26/19: A1c 7.2%. triglycerides 191, cholesterol 130, HDL 32, LDL 60. Glucose 188, CMET normal. CBC normal.  Dated 09/23/20: Hgb 13.2, plts 134K. WBC 3.8. glucose 159, cholesterol 112, triglycerides 197, HDL 27, LDL 52. Otherwise CMET normal. A1c 7.2% Dated 10/06/21: glucose 182, otherwise CMET normal. A1c 7.3%. CBC normal Cholesterol 147, triglycerides 319, HDL 33, LDL 55 Dated 10/12/22: glucose 172, otherwise CMET normal. A1c 7.9%. cholesterol 142,s Triglycerides 246, HDL 31, LDL 77.    Echo: 09/01/15:Study Conclusions   - Left ventricle: The cavity size was normal. Wall thickness was   normal. Systolic function was normal. The estimated ejection   fraction was in the range of 60% to 65%. Wall motion was normal;   there were no regional wall motion abnormalities. Left   ventricular diastolic function parameters were normal. - Mitral valve: Prior procedures included surgical repair. The   findings are consistent with mild stenosis. There was mild   regurgitation. - Pulmonary arteries: Systolic pressure was mildly increased. PA   peak pressure: 32 mm Hg (S).   Assessment / Plan: 1. Coronary disease status post CABG in 2010. She is asymptomatic. Ejection fraction was normal in 2017. We will continue on aspirin , metoprolol , and statin therapy.   2. Seizure disorder.  3. Hypertension. Blood pressure is up  today. Continue amlodipine , metoprolol  and ramipril . patient reports good readings at home and with PCP  4. Hypercholesterolemia on chronic statin therapy. LDL was 7.7. prior to that it was 55. Focus on weight loss and better diabetic control.   5. DM type 2. Per primary care. Last A1c 7.7%.   I will follow up in one year  "

## 2024-02-06 ENCOUNTER — Other Ambulatory Visit: Payer: Self-pay | Admitting: Cardiology

## 2024-02-08 ENCOUNTER — Ambulatory Visit: Attending: Cardiology | Admitting: Cardiology

## 2024-02-08 ENCOUNTER — Encounter: Payer: Self-pay | Admitting: Cardiology

## 2024-02-08 VITALS — BP 136/72 | HR 63 | Ht 61.0 in | Wt 163.8 lb

## 2024-02-08 DIAGNOSIS — E7800 Pure hypercholesterolemia, unspecified: Secondary | ICD-10-CM | POA: Diagnosis not present

## 2024-02-08 DIAGNOSIS — I10 Essential (primary) hypertension: Secondary | ICD-10-CM

## 2024-02-08 DIAGNOSIS — Z951 Presence of aortocoronary bypass graft: Secondary | ICD-10-CM | POA: Diagnosis not present

## 2024-02-08 DIAGNOSIS — I25810 Atherosclerosis of coronary artery bypass graft(s) without angina pectoris: Secondary | ICD-10-CM

## 2024-02-08 MED ORDER — AMLODIPINE BESYLATE 5 MG PO TABS: 5.0000 mg | ORAL_TABLET | Freq: Every day | ORAL | 3 refills | Status: AC

## 2024-02-08 MED ORDER — METOPROLOL TARTRATE 50 MG PO TABS: 50.0000 mg | ORAL_TABLET | Freq: Two times a day (BID) | ORAL | 3 refills | Status: AC

## 2024-02-08 MED ORDER — RAMIPRIL 10 MG PO CAPS: 10.0000 mg | ORAL_CAPSULE | Freq: Two times a day (BID) | ORAL | 3 refills | Status: AC

## 2024-02-08 MED ORDER — ROSUVASTATIN CALCIUM 20 MG PO TABS: 20.0000 mg | ORAL_TABLET | Freq: Every day | ORAL | 3 refills | Status: AC

## 2024-02-08 NOTE — Patient Instructions (Signed)

## 2024-07-23 ENCOUNTER — Ambulatory Visit: Admitting: Neurology
